# Patient Record
Sex: Male | Born: 1940 | Race: White | Hispanic: No | Marital: Married | Smoking: Former smoker
Health system: Southern US, Community
[De-identification: ages and names within clinical notes are randomized; demographics above are authoritative.]

## PROBLEM LIST (undated history)

## (undated) DIAGNOSIS — N281 Cyst of kidney, acquired: Secondary | ICD-10-CM

## (undated) DIAGNOSIS — I10 Essential (primary) hypertension: Secondary | ICD-10-CM

## (undated) DIAGNOSIS — Z87442 Personal history of urinary calculi: Secondary | ICD-10-CM

## (undated) DIAGNOSIS — N183 Chronic kidney disease, stage 3 unspecified: Secondary | ICD-10-CM

## (undated) DIAGNOSIS — E782 Mixed hyperlipidemia: Secondary | ICD-10-CM

## (undated) DIAGNOSIS — D72819 Decreased white blood cell count, unspecified: Secondary | ICD-10-CM

## (undated) DIAGNOSIS — R519 Headache, unspecified: Secondary | ICD-10-CM

## (undated) DIAGNOSIS — E041 Nontoxic single thyroid nodule: Secondary | ICD-10-CM

## (undated) DIAGNOSIS — N400 Enlarged prostate without lower urinary tract symptoms: Secondary | ICD-10-CM

## (undated) DIAGNOSIS — E118 Type 2 diabetes mellitus with unspecified complications: Secondary | ICD-10-CM

## (undated) DIAGNOSIS — M1990 Unspecified osteoarthritis, unspecified site: Secondary | ICD-10-CM

## (undated) DIAGNOSIS — M47816 Spondylosis without myelopathy or radiculopathy, lumbar region: Secondary | ICD-10-CM

## (undated) DIAGNOSIS — I639 Cerebral infarction, unspecified: Secondary | ICD-10-CM

## (undated) DIAGNOSIS — I77810 Thoracic aortic ectasia: Secondary | ICD-10-CM

## (undated) DIAGNOSIS — I5189 Other ill-defined heart diseases: Secondary | ICD-10-CM

## (undated) DIAGNOSIS — D0000 Carcinoma in situ of oral cavity, unspecified site: Secondary | ICD-10-CM

## (undated) DIAGNOSIS — K7460 Unspecified cirrhosis of liver: Secondary | ICD-10-CM

## (undated) DIAGNOSIS — N200 Calculus of kidney: Secondary | ICD-10-CM

## (undated) DIAGNOSIS — M109 Gout, unspecified: Secondary | ICD-10-CM

## (undated) HISTORY — PX: TOTAL SHOULDER REPLACEMENT: SUR1217

## (undated) HISTORY — DX: Chronic kidney disease, stage 3 unspecified: N18.30

## (undated) HISTORY — DX: Type 2 diabetes mellitus with unspecified complications: E11.8

## (undated) HISTORY — PX: ORTHOPEDIC SURGERY: SHX850

## (undated) HISTORY — DX: Carcinoma in situ of oral cavity, unspecified site: D00.00

## (undated) HISTORY — DX: Unspecified osteoarthritis, unspecified site: M19.90

## (undated) HISTORY — DX: Decreased white blood cell count, unspecified: D72.819

## (undated) HISTORY — DX: Nontoxic single thyroid nodule: E04.1

## (undated) HISTORY — DX: Cyst of kidney, acquired: N28.1

## (undated) HISTORY — PX: BIOPSY THYROID: PRO38

## (undated) HISTORY — DX: Calculus of kidney: N20.0

## (undated) HISTORY — DX: Other ill-defined heart diseases: I51.89

## (undated) HISTORY — DX: Unspecified cirrhosis of liver: K74.60

## (undated) HISTORY — DX: Cerebral infarction, unspecified: I63.9

## (undated) HISTORY — DX: Mixed hyperlipidemia: E78.2

## (undated) HISTORY — DX: Gout, unspecified: M10.9

## (undated) HISTORY — DX: Chronic kidney disease, stage 3 (moderate): N18.3

## (undated) HISTORY — DX: Thoracic aortic ectasia: I77.810

## (undated) HISTORY — DX: Headache, unspecified: R51.9

## (undated) HISTORY — DX: Spondylosis without myelopathy or radiculopathy, lumbar region: M47.816

## (undated) HISTORY — DX: Benign prostatic hyperplasia without lower urinary tract symptoms: N40.0

---

## 1994-11-18 HISTORY — PX: BACK SURGERY: SHX140

## 2000-01-23 ENCOUNTER — Encounter: Payer: Self-pay | Admitting: Family Medicine

## 2000-01-23 ENCOUNTER — Encounter: Admission: RE | Admit: 2000-01-23 | Discharge: 2000-01-23 | Payer: Self-pay | Admitting: Family Medicine

## 2007-11-19 HISTORY — PX: COLONOSCOPY: SHX174

## 2013-07-01 ENCOUNTER — Emergency Department (HOSPITAL_COMMUNITY)
Admission: EM | Admit: 2013-07-01 | Discharge: 2013-07-02 | Disposition: A | Discharge status: Home / Self Care | Payer: Medicare Other | Attending: Emergency Medicine | Admitting: Emergency Medicine

## 2013-07-01 ENCOUNTER — Encounter (HOSPITAL_COMMUNITY): Payer: Self-pay | Admitting: *Deleted

## 2013-07-01 DIAGNOSIS — R358 Other polyuria: Secondary | ICD-10-CM | POA: Insufficient documentation

## 2013-07-01 DIAGNOSIS — Z79899 Other long term (current) drug therapy: Secondary | ICD-10-CM | POA: Insufficient documentation

## 2013-07-01 DIAGNOSIS — Z880 Allergy status to penicillin: Secondary | ICD-10-CM | POA: Insufficient documentation

## 2013-07-01 DIAGNOSIS — R3589 Other polyuria: Secondary | ICD-10-CM | POA: Insufficient documentation

## 2013-07-01 DIAGNOSIS — I10 Essential (primary) hypertension: Secondary | ICD-10-CM | POA: Insufficient documentation

## 2013-07-01 DIAGNOSIS — Z7982 Long term (current) use of aspirin: Secondary | ICD-10-CM | POA: Insufficient documentation

## 2013-07-01 DIAGNOSIS — R5381 Other malaise: Secondary | ICD-10-CM | POA: Insufficient documentation

## 2013-07-01 DIAGNOSIS — R5383 Other fatigue: Secondary | ICD-10-CM | POA: Insufficient documentation

## 2013-07-01 DIAGNOSIS — R609 Edema, unspecified: Secondary | ICD-10-CM | POA: Insufficient documentation

## 2013-07-01 DIAGNOSIS — R631 Polydipsia: Secondary | ICD-10-CM | POA: Insufficient documentation

## 2013-07-01 DIAGNOSIS — N289 Disorder of kidney and ureter, unspecified: Secondary | ICD-10-CM | POA: Insufficient documentation

## 2013-07-01 DIAGNOSIS — E1169 Type 2 diabetes mellitus with other specified complication: Secondary | ICD-10-CM | POA: Insufficient documentation

## 2013-07-01 DIAGNOSIS — R739 Hyperglycemia, unspecified: Secondary | ICD-10-CM

## 2013-07-01 HISTORY — DX: Essential (primary) hypertension: I10

## 2013-07-01 LAB — COMPREHENSIVE METABOLIC PANEL
ALT: 24 U/L (ref 0–53)
ALT: 18 U/L (ref 0–53)
AST: 28 U/L (ref 0–37)
AST: 21 U/L (ref 0–37)
Albumin: 3.7 g/dL (ref 3.5–5.2)
Albumin: 3.2 g/dL — ABNORMAL LOW (ref 3.5–5.2)
Alkaline Phosphatase: 40 U/L (ref 39–117)
Alkaline Phosphatase: 33 U/L — ABNORMAL LOW (ref 39–117)
BUN: 39 mg/dL — ABNORMAL HIGH (ref 6–23)
CO2: 26 mEq/L (ref 19–32)
Calcium: 9.8 mg/dL (ref 8.4–10.5)
Calcium: 8.9 mg/dL (ref 8.4–10.5)
Chloride: 93 mEq/L — ABNORMAL LOW (ref 96–112)
Creatinine, Ser: 1.8 mg/dL — ABNORMAL HIGH (ref 0.50–1.35)
GFR calc Af Amer: 41 mL/min — ABNORMAL LOW (ref >90)
GFR calc Af Amer: 47 mL/min — ABNORMAL LOW (ref >90)
GFR calc non Af Amer: 36 mL/min — ABNORMAL LOW (ref >90)
Glucose, Bld: 502 mg/dL — ABNORMAL HIGH (ref 70–99)
Potassium: 4.9 mEq/L (ref 3.5–5.1)
Potassium: 3.8 mEq/L (ref 3.5–5.1)
Sodium: 131 mEq/L — ABNORMAL LOW (ref 135–145)
Sodium: 137 mEq/L (ref 135–145)
Total Bilirubin: 0.4 mg/dL (ref 0.3–1.2)
Total Protein: 6.3 g/dL (ref 6.0–8.3)
Total Protein: 5.7 g/dL — ABNORMAL LOW (ref 6.0–8.3)

## 2013-07-01 LAB — CBC
HCT: 37.4 % — ABNORMAL LOW (ref 39.0–52.0)
Hemoglobin: 13.3 g/dL (ref 13.0–17.0)
MCH: 33.9 pg (ref 26.0–34.0)
MCHC: 35.6 g/dL (ref 30.0–36.0)
MCV: 95.4 fL (ref 78.0–100.0)
Platelets: 238 10*3/uL (ref 150–400)
RBC: 3.92 MIL/uL — ABNORMAL LOW (ref 4.22–5.81)
RDW: 13.9 % (ref 11.5–15.5)
WBC: 6.2 10*3/uL (ref 4.0–10.5)

## 2013-07-01 LAB — GLUCOSE, CAPILLARY
Glucose-Capillary: 442 mg/dL — ABNORMAL HIGH (ref 70–99)
Glucose-Capillary: 357 mg/dL — ABNORMAL HIGH (ref 70–99)

## 2013-07-01 MED ORDER — INSULIN ASPART 100 UNIT/ML ~~LOC~~ SOLN
10.0000 [IU] | Freq: Once | SUBCUTANEOUS | Status: AC
  Administered 2013-07-01: 10 [IU] via INTRAVENOUS
  Filled 2013-07-01: qty 1

## 2013-07-01 MED ORDER — SODIUM CHLORIDE 0.9 % IV BOLUS (SEPSIS)
1000.0000 mL | Freq: Once | INTRAVENOUS | Status: AC
Start: 2013-07-01 — End: 2013-07-01
  Administered 2013-07-01: 1000 mL via INTRAVENOUS

## 2013-07-01 MED ORDER — SODIUM CHLORIDE 0.9 % IV BOLUS (SEPSIS)
1000.0000 mL | Freq: Once | INTRAVENOUS | Status: AC
  Administered 2013-07-01: 1000 mL via INTRAVENOUS

## 2013-07-01 NOTE — ED Notes (Signed)
Sent to ED by PMD for treatment of high glucose. States office meter read 'high'. States he has been drinking a lot of water and urinating frequently. Denies cp and/or sob. Denies vomiting.

## 2013-07-02 NOTE — ED Provider Notes (Signed)
CSN: [id number]     Arrival date & time 07/01/13  1532 History     First MD Initiated Contact with Patient 07/01/13 1737     Chief Complaint  Patient presents with  . Hyperglycemia   (Consider location/radiation/quality/duration/timing/severity/associated sxs/prior Treatment) HPI Comments: Pt send from PCP office for hyperglcemia.  Pt reports generalized weakness, polyuria/polydypsia, FSBG>500 today.  Has not taken it in several days but has been complaint w/ meds.  No recent illness.  Pt was given a dose of short acting & long acting insulin in office prior to arrival.  Pt denies CP, SOB, ab pain, n/v, d/a, fever.  No hx of DKA.    Past Medical History  Diagnosis Date  . Diabetes mellitus without complication   . Hypertension    Past Surgical History  Procedure Laterality Date  . Orthopedic surgery     History reviewed. No pertinent family history. History  Substance Use Topics  . Smoking status: Never Smoker   . Smokeless tobacco: Not on file  . Alcohol Use: No    Review of Systems  Constitutional: Negative for fever, activity change, appetite change and fatigue.  HENT: Negative for congestion, facial swelling, rhinorrhea and trouble swallowing.   Eyes: Negative for photophobia and pain.  Respiratory: Negative for cough, chest tightness and shortness of breath.   Cardiovascular: Negative for chest pain and leg swelling.  Gastrointestinal: Negative for nausea, vomiting, abdominal pain, diarrhea and constipation.  Endocrine: Positive for polydipsia and polyuria.  Genitourinary: Negative for dysuria, urgency, decreased urine volume and difficulty urinating.  Musculoskeletal: Negative for back pain and gait problem.  Skin: Negative for color change, rash and wound.  Allergic/Immunologic: Negative for immunocompromised state.  Neurological: Positive for weakness (generalized). Negative for dizziness, facial asymmetry, speech difficulty, numbness and headaches.   Psychiatric/Behavioral: Negative for confusion, decreased concentration and agitation.    Allergies  Ivp dye and Penicillins  Home Medications   Current Outpatient Rx  Name  Route  Sig  Dispense  Refill  . allopurinol (ZYLOPRIM) 300 MG tablet   Oral   Take 300 mg by mouth at bedtime.         Marland Kitchen aspirin 81 MG tablet   Oral   Take 81 mg by mouth daily.         Marland Kitchen atorvastatin (LIPITOR) 20 MG tablet   Oral   Take 20 mg by mouth daily.         . benazepril (LOTENSIN) 40 MG tablet   Oral   Take 40 mg by mouth at bedtime.         . fenofibrate micronized (LOFIBRA) 134 MG capsule   Oral   Take 134 mg by mouth daily before breakfast.         . Ibuprofen (ADVIL PO)   Oral   Take 3 tablets by mouth daily as needed (pain).         . Loratadine (CLARITIN PO)   Oral   Take 1 tablet by mouth daily as needed (allergies).         . metFORMIN (GLUCOPHAGE) 1000 MG tablet   Oral   Take 1,000 mg by mouth 2 (two) times daily with a meal.         . pioglitazone (ACTOS) 45 MG tablet   Oral   Take 45 mg by mouth daily.          BP 167/70  Pulse 72  Temp(Src) 98.3 F (36.8 C) (Oral)  Resp 15  SpO2  96% Physical Exam  Constitutional: He is oriented to person, place, and time. He appears well-developed and well-nourished. No distress.  HENT:  Head: Normocephalic and atraumatic.  Mouth/Throat: No oropharyngeal exudate.  Eyes: Pupils are equal, round, and reactive to light.  Neck: Normal range of motion. Neck supple.  Cardiovascular: Normal rate, regular rhythm and normal heart sounds.  Exam reveals no gallop and no friction rub.   No murmur heard. Pulmonary/Chest: Effort normal and breath sounds normal. No respiratory distress. He has no wheezes. He has no rales.  Abdominal: Soft. Bowel sounds are normal. He exhibits no distension and no mass. There is no tenderness. There is no rebound and no guarding.  Musculoskeletal: Normal range of motion. He exhibits edema  (BLLE). He exhibits no tenderness.  Neurological: He is alert and oriented to person, place, and time. No cranial nerve deficit. Coordination normal.  Skin: Skin is warm and dry.  Psychiatric: He has a normal mood and affect.    ED Course   Procedures (including critical care time)  Labs Reviewed  COMPREHENSIVE METABOLIC PANEL - Abnormal; Notable for the following:    Sodium 131 (*)    Chloride 93 (*)    Glucose, Bld 502 (*)    BUN 39 (*)    Creatinine, Ser 1.80 (*)    GFR calc non Af Amer 36 (*)    GFR calc Af Amer 41 (*)    All other components within normal limits  CBC - Abnormal; Notable for the following:    RBC 3.92 (*)    HCT 37.4 (*)    All other components within normal limits  GLUCOSE, CAPILLARY - Abnormal; Notable for the following:    Glucose-Capillary 442 (*)    All other components within normal limits  GLUCOSE, CAPILLARY - Abnormal; Notable for the following:    Glucose-Capillary 357 (*)    All other components within normal limits  COMPREHENSIVE METABOLIC PANEL - Abnormal; Notable for the following:    Glucose, Bld 156 (*)    BUN 35 (*)    Creatinine, Ser 1.63 (*)    Total Protein 5.7 (*)    Albumin 3.2 (*)    Alkaline Phosphatase 33 (*)    GFR calc non Af Amer 40 (*)    GFR calc Af Amer 47 (*)    All other components within normal limits  GLUCOSE, CAPILLARY - Abnormal; Notable for the following:    Glucose-Capillary 195 (*)    All other components within normal limits  BLOOD GAS, VENOUS   No results found. 1. Hyperglycemia   2. Renal insufficiency     MDM  Pt send from PCP office for hyperglcemia.  Pt reports generalized weakness, polyuria/polydypsia, FSBG>500 today.  Has not taken it in several days but has been complaint w/ meds.  No recent illness.  Pt found to have hyperglycemia w/o concern for DKA.  Glucose improved after 2L NS and 10U regular insulin.  Pt's symptoms also improved.  Additionaly, he had Cr of 1.8 that improved to 1.6  No prior  in system.  Will f/u as outpt for repeat Cr. Will d/c home w/ return precautions for new or worsening symptoms. He will hold home actos tonight as he has had dose of long acting insulin in PCP office today.  Will resume meds in morning.  Return precautions given for new or worsening symptoms including hyer/hypoglycemia, fever, CP, SOB.   1. Hyperglycemia   2. Renal insufficiency      Emmitt Matthews  Arna Snipe, MD 07/02/13 [phone]

## 2016-02-15 DIAGNOSIS — I10 Essential (primary) hypertension: Secondary | ICD-10-CM | POA: Insufficient documentation

## 2016-02-15 DIAGNOSIS — E119 Type 2 diabetes mellitus without complications: Secondary | ICD-10-CM | POA: Insufficient documentation

## 2016-02-15 DIAGNOSIS — E782 Mixed hyperlipidemia: Secondary | ICD-10-CM | POA: Insufficient documentation

## 2016-02-15 DIAGNOSIS — M1990 Unspecified osteoarthritis, unspecified site: Secondary | ICD-10-CM | POA: Insufficient documentation

## 2016-02-15 DIAGNOSIS — M109 Gout, unspecified: Secondary | ICD-10-CM | POA: Insufficient documentation

## 2016-05-29 ENCOUNTER — Other Ambulatory Visit: Payer: Self-pay | Admitting: Nephrology

## 2016-05-29 DIAGNOSIS — N183 Chronic kidney disease, stage 3 unspecified: Secondary | ICD-10-CM

## 2016-06-03 ENCOUNTER — Ambulatory Visit
Admission: RE | Admit: 2016-06-03 | Discharge: 2016-06-03 | Disposition: A | Discharge status: Home / Self Care | Payer: Medicare Other | Source: Ambulatory Visit | Attending: Nephrology | Admitting: Nephrology

## 2016-06-03 DIAGNOSIS — N183 Chronic kidney disease, stage 3 unspecified: Secondary | ICD-10-CM

## 2016-06-03 DIAGNOSIS — N281 Cyst of kidney, acquired: Secondary | ICD-10-CM

## 2016-06-03 HISTORY — DX: Cyst of kidney, acquired: N28.1

## 2016-06-03 IMAGING — US US RENAL
1 series · 14 of 25 positions shown · non-contrast
Comparison: None in PACs

CLINICAL DATA: Chronic renal insufficiency

EXAM:
RENAL / URINARY TRACT ULTRASOUND COMPLETE

[Series 1: us renal · 0.28mm/px · 14 of 36 slices shown]
[im 1/36]
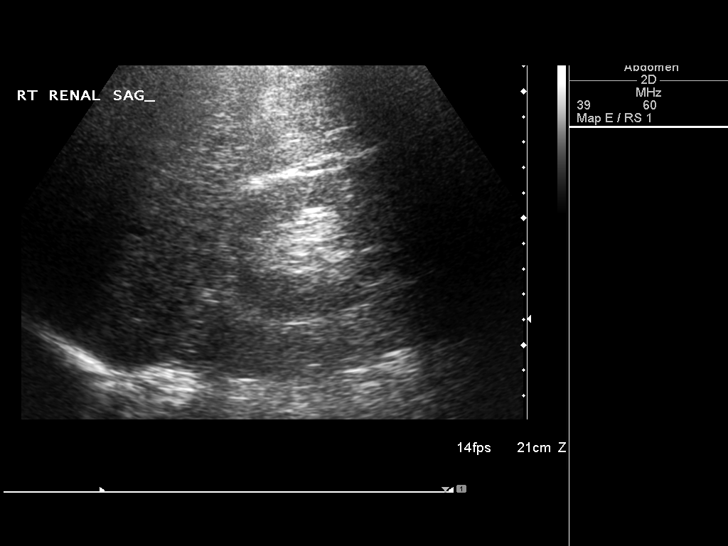
[im 3/36]
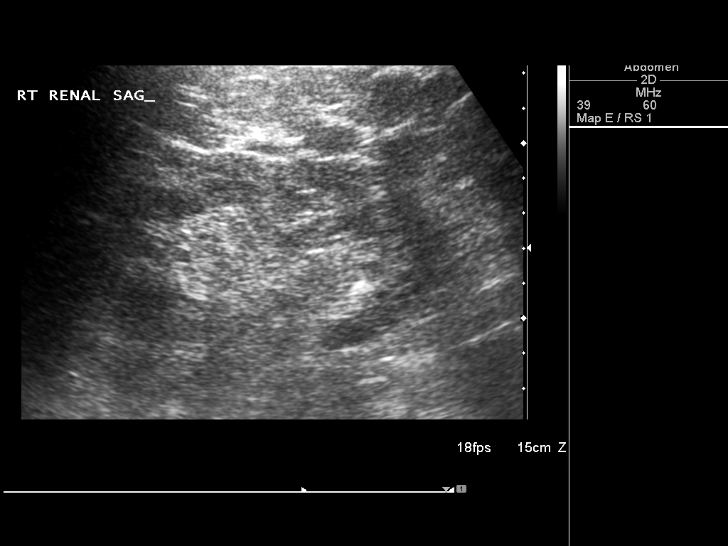
[im 6/36]
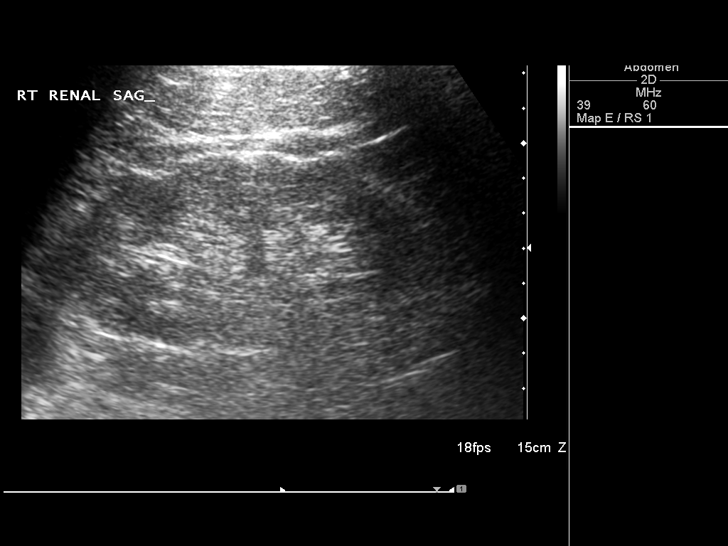
[im 9/36]
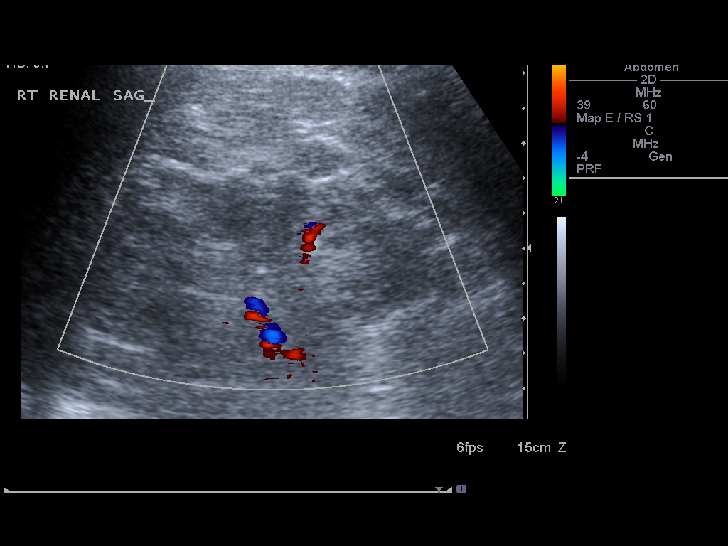
[im 12/36]
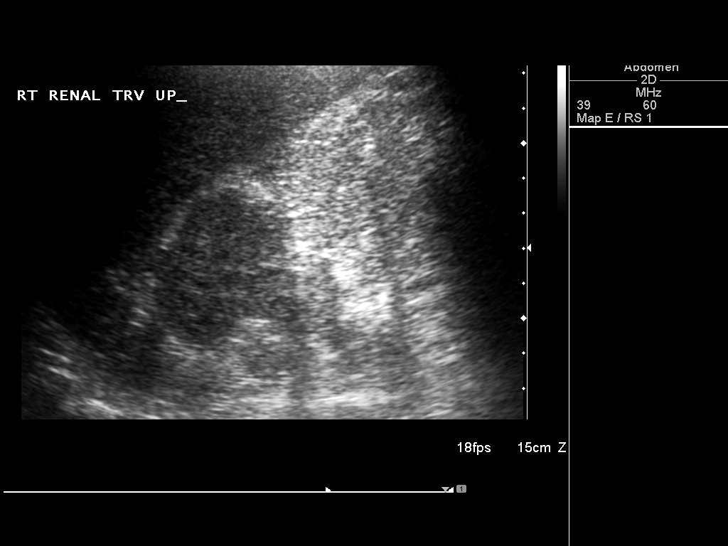
[im 14/36]
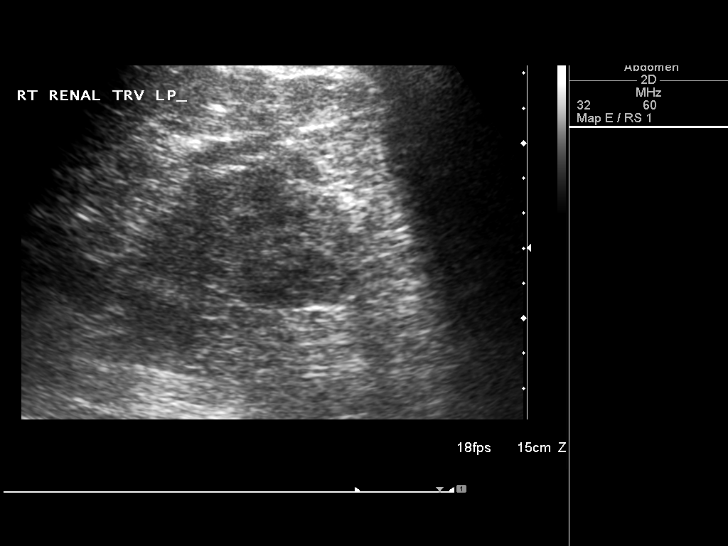
[im 17/36]
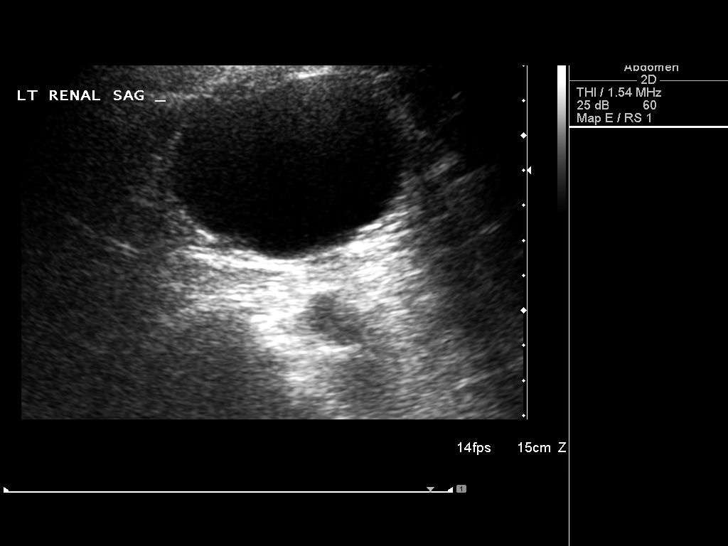
[im 19/36]
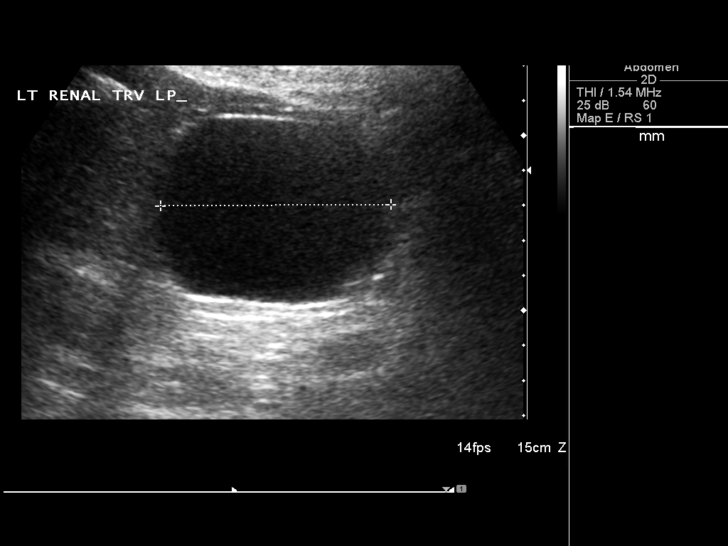
[im 22/36]
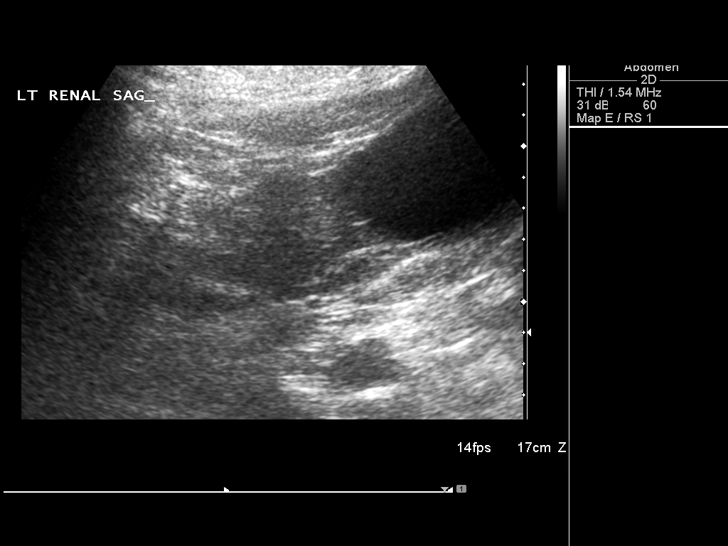
[im 24/36]
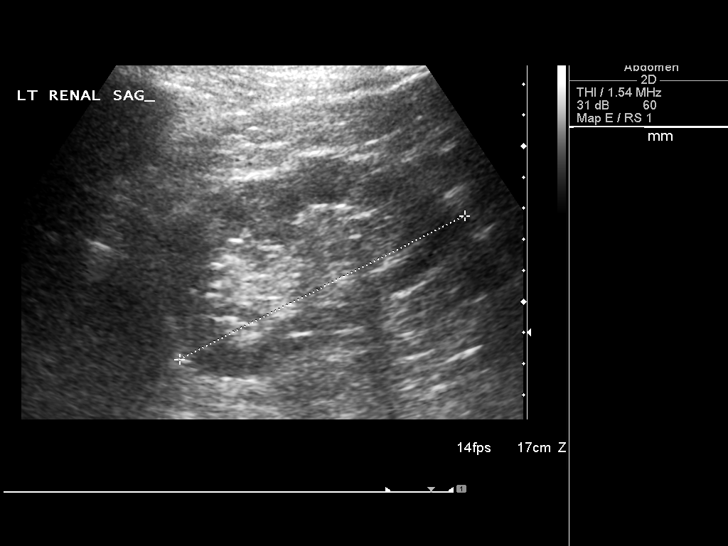
[im 27/36]
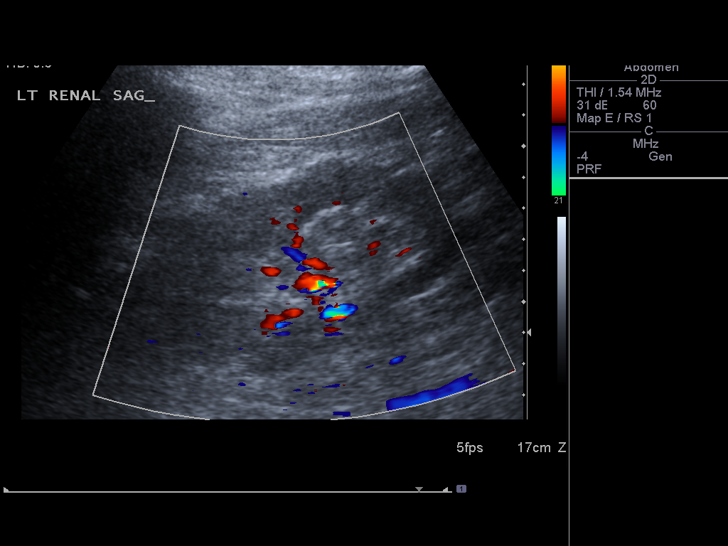
[im 30/36]
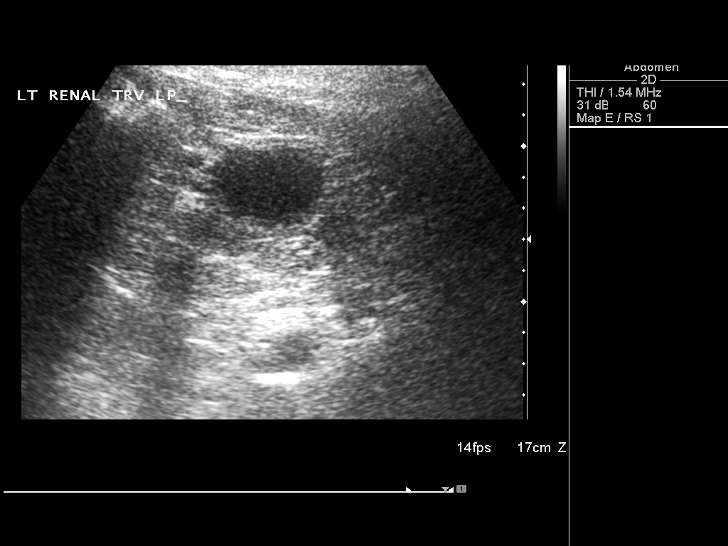
[im 33/36]
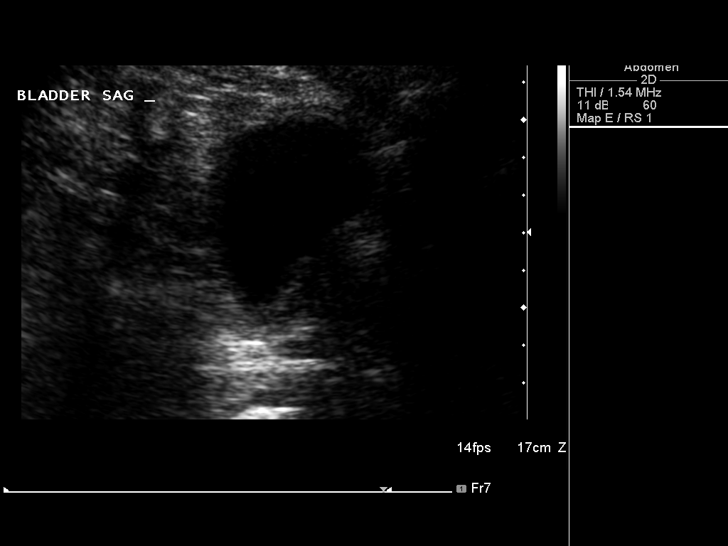
[im 36/36]
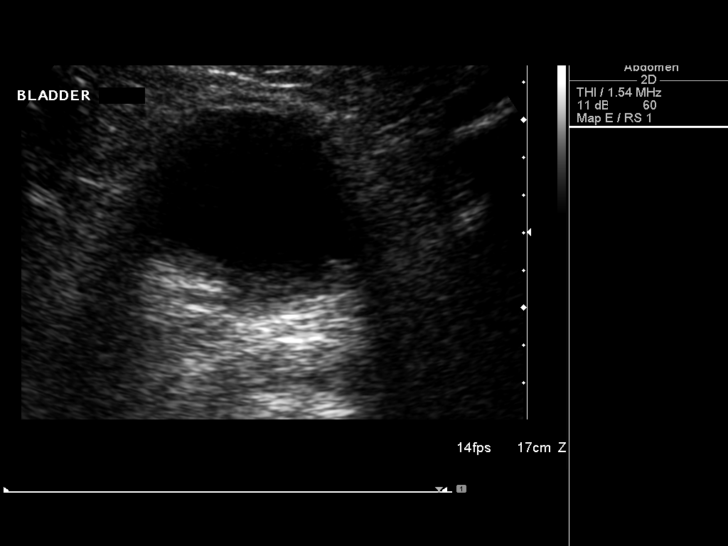

[14 of 25 positions shown; findings below may reference images not displayed]

FINDINGS: Right Kidney:

Length: 11.3 cm. The renal cortical echotexture of the right kidney
is approximately equal to that of the liver. There is no discrete
mass. There is no hydronephrosis.

Left Kidney:

Length: 10.3 cm. The echotexture of the cortex of the left kidney is
similar to that on the right. There is a dominant lower pole cyst
measuring 6.8 x 5.3 x 6.6 cm. There is no hydronephrosis.

Bladder:

The partially distended urinary bladder is normal.
IMPRESSION: Increased renal cortical echotexture bilaterally consistent with
medical renal disease. There is no evidence of obstruction. There is
a simple appearing cystic structure in the lower pole of the left
kidney measuring 6.8 cm in greatest dimension.

## 2016-08-26 ENCOUNTER — Encounter: Payer: Self-pay | Admitting: Family Medicine

## 2016-08-26 ENCOUNTER — Ambulatory Visit (INDEPENDENT_AMBULATORY_CARE_PROVIDER_SITE_OTHER): Payer: Medicare Other | Admitting: Family Medicine

## 2016-08-26 VITALS — BP 123/78 | HR 86 | Temp 97.5°F | Resp 14 | Ht 72.0 in | Wt 226.4 lb

## 2016-08-26 DIAGNOSIS — Z794 Long term (current) use of insulin: Secondary | ICD-10-CM | POA: Diagnosis not present

## 2016-08-26 DIAGNOSIS — Z23 Encounter for immunization: Secondary | ICD-10-CM | POA: Diagnosis not present

## 2016-08-26 DIAGNOSIS — N183 Chronic kidney disease, stage 3 unspecified: Secondary | ICD-10-CM

## 2016-08-26 DIAGNOSIS — E118 Type 2 diabetes mellitus with unspecified complications: Secondary | ICD-10-CM

## 2016-08-26 LAB — BASIC METABOLIC PANEL
BUN: 36 mg/dL — AB (ref 6–23)
CHLORIDE: 105 meq/L (ref 96–112)
CO2: 26 meq/L (ref 19–32)
Calcium: 9.7 mg/dL (ref 8.4–10.5)
Creatinine, Ser: 1.99 mg/dL — ABNORMAL HIGH (ref 0.40–1.50)
GFR: 34.8 mL/min — ABNORMAL LOW (ref >60.00)
Glucose, Bld: 265 mg/dL — ABNORMAL HIGH (ref 70–99)
POTASSIUM: 4.8 meq/L (ref 3.5–5.1)
Sodium: 138 mEq/L (ref 135–145)

## 2016-08-26 LAB — HEMOGLOBIN A1C: HEMOGLOBIN A1C: 8.2 % — AB (ref 4.6–6.5)

## 2016-08-26 NOTE — Addendum Note (Signed)
Addended by: Gordy Councilman on: 08/26/2016 09:43 AM   Modules accepted: Orders
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
## 2016-08-26 NOTE — Progress Notes (Signed)
Pre visit review using our clinic review tool, if applicable. No additional management support is needed unless otherwise documented below in the visit note. 

## 2016-08-28 ENCOUNTER — Encounter: Payer: Self-pay | Admitting: Family Medicine

## 2016-10-28 ENCOUNTER — Other Ambulatory Visit: Payer: Self-pay | Admitting: *Deleted

## 2016-10-28 ENCOUNTER — Ambulatory Visit (INDEPENDENT_AMBULATORY_CARE_PROVIDER_SITE_OTHER): Payer: Medicare Other | Admitting: Family Medicine

## 2016-10-28 ENCOUNTER — Encounter: Payer: Self-pay | Admitting: Family Medicine

## 2016-10-28 VITALS — BP 152/70 | HR 92 | Temp 97.8°F | Ht 72.0 in | Wt 225.0 lb

## 2016-10-28 DIAGNOSIS — E119 Type 2 diabetes mellitus without complications: Secondary | ICD-10-CM | POA: Diagnosis not present

## 2016-10-28 DIAGNOSIS — Z0000 Encounter for general adult medical examination without abnormal findings: Secondary | ICD-10-CM | POA: Diagnosis not present

## 2016-10-28 MED ORDER — ATORVASTATIN CALCIUM 20 MG PO TABS: 20.0000 mg | ORAL_TABLET | Freq: Every day | ORAL | 1 refills | Status: DC

## 2016-10-28 MED ORDER — FENOFIBRATE MICRONIZED 134 MG PO CAPS: 134.0000 mg | ORAL_CAPSULE | Freq: Every day | ORAL | 1 refills | Status: DC

## 2016-10-28 MED ORDER — ALLOPURINOL 300 MG PO TABS: 300.0000 mg | ORAL_TABLET | Freq: Every day | ORAL | 3 refills | Status: DC

## 2016-10-28 MED ORDER — LANTUS 100 UNIT/ML ~~LOC~~ SOLN: SUBCUTANEOUS | 1 refills | Status: DC

## 2016-10-28 NOTE — Telephone Encounter (Signed)
Fax from [facility] requesting refill for Lantus vial, fenofibrate, atorvastatin and allopurinol.  LOV: 10/28/16 NOV: 12/27/16   RF's unknow, new pt.
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
## 2016-10-28 NOTE — Progress Notes (Signed)
Pre visit review using our clinic review tool, if applicable. No additional management support is needed unless otherwise documented below in the visit note. 

## 2016-10-28 NOTE — Patient Instructions (Addendum)
Eat heart healthy diet (full of fruits, vegetables, whole grains, lean protein, water--limit salt, fat, and sugar intake) and increase physical activity as tolerated.  Start doing brain stimulating activities (puzzles, reading, adult coloring books, staying active) to keep memory sharp.   Bring a copy of your advance directives to your next office visit.   Fall Prevention in the Home Introduction Falls can cause injuries. They can happen to people of all ages. There are many things you can do to make your home safe and to help prevent falls. What can I do on the outside of my home?  Regularly fix the edges of walkways and driveways and fix any cracks.  Remove anything that might make you trip as you walk through a door, such as a raised step or threshold.  Trim any bushes or trees on the path to your home.  Use bright outdoor lighting.  Clear any walking paths of anything that might make someone trip, such as rocks or tools.  Regularly check to see if handrails are loose or broken. Make sure that both sides of any steps have handrails.  Any raised decks and porches should have guardrails on the edges.  Have any leaves, snow, or ice cleared regularly.  Use sand or salt on walking paths during winter.  Clean up any spills in your garage right away. This includes oil or grease spills. What can I do in the bathroom?  Use night lights.  Install grab bars by the toilet and in the tub and shower. Do not use towel bars as grab bars.  Use non-skid mats or decals in the tub or shower.  If you need to sit down in the shower, use a plastic, non-slip stool.  Keep the floor dry. Clean up any water that spills on the floor as soon as it happens.  Remove soap buildup in the tub or shower regularly.  Attach bath mats securely with double-sided non-slip rug tape.  Do not have throw rugs and other things on the floor that can make you trip. What can I do in the bedroom?  Use night  lights.  Make sure that you have a light by your bed that is easy to reach.  Do not use any sheets or blankets that are too big for your bed. They should not hang down onto the floor.  Have a firm chair that has side arms. You can use this for support while you get dressed.  Do not have throw rugs and other things on the floor that can make you trip. What can I do in the kitchen?  Clean up any spills right away.  Avoid walking on wet floors.  Keep items that you use a lot in easy-to-reach places.  If you need to reach something above you, use a strong step stool that has a grab bar.  Keep electrical cords out of the way.  Do not use floor polish or wax that makes floors slippery. If you must use wax, use non-skid floor wax.  Do not have throw rugs and other things on the floor that can make you trip. What can I do with my stairs?  Do not leave any items on the stairs.  Make sure that there are handrails on both sides of the stairs and use them. Fix handrails that are broken or loose. Make sure that handrails are as long as the stairways.  Check any carpeting to make sure that it is firmly attached to the stairs. Fix  any carpet that is loose or worn.  Avoid having throw rugs at the top or bottom of the stairs. If you do have throw rugs, attach them to the floor with carpet tape.  Make sure that you have a light switch at the top of the stairs and the bottom of the stairs. If you do not have them, ask someone to add them for you. What else can I do to help prevent falls?  Wear shoes that:  Do not have high heels.  Have rubber bottoms.  Are comfortable and fit you well.  Are closed at the toe. Do not wear sandals.  If you use a stepladder:  Make sure that it is fully opened. Do not climb a closed stepladder.  Make sure that both sides of the stepladder are locked into place.  Ask someone to hold it for you, if possible.  Clearly mark and make sure that you can  see:  Any grab bars or handrails.  First and last steps.  Where the edge of each step is.  Use tools that help you move around (mobility aids) if they are needed. These include:  Canes.  Walkers.  Scooters.  Crutches.  Turn on the lights when you go into a dark area. Replace any light bulbs as soon as they burn out.  Set up your furniture so you have a clear path. Avoid moving your furniture around.  If any of your floors are uneven, fix them.  If there are any pets around you, be aware of where they are.  Review your medicines with your doctor. Some medicines can make you feel dizzy. This can increase your chance of falling. Ask your doctor what other things that you can do to help prevent falls. This information is not intended to replace advice given to you by your health care provider. Make sure you discuss any questions you have with your health care provider. Document Released: 08/31/2009 Document Revised: 04/11/2016 Document Reviewed: 12/09/2014  2017 Elsevier  Health Maintenance, Male A healthy lifestyle and preventative care can promote health and wellness.  Maintain regular health, dental, and eye exams.  Eat a healthy diet. Foods like vegetables, fruits, whole grains, low-fat dairy products, and lean protein foods contain the nutrients you need and are low in calories. Decrease your intake of foods high in solid fats, added sugars, and salt. Get information about a proper diet from your health care provider, if necessary.  Regular physical exercise is one of the most important things you can do for your health. Most adults should get at least 150 minutes of moderate-intensity exercise (any activity that increases your heart rate and causes you to sweat) each week. In addition, most adults need muscle-strengthening exercises on 2 or more days a week.   Maintain a healthy weight. The body mass index (BMI) is a screening tool to identify possible weight problems. It  provides an estimate of body fat based on height and weight. Your health care provider can find your BMI and can help you achieve or maintain a healthy weight. For males 20 years and older:  A BMI below 18.5 is considered underweight.  A BMI of 18.5 to 24.9 is normal.  A BMI of 25 to 29.9 is considered overweight.  A BMI of 30 and above is considered obese.  Maintain normal blood lipids and cholesterol by exercising and minimizing your intake of saturated fat. Eat a balanced diet with plenty of fruits and vegetables. Blood tests for lipids  and cholesterol should begin at age 20 and be repeated every 5 years. If your lipid or cholesterol levels are high, you are over age 50, or you are at high risk for heart disease, you may need your cholesterol levels checked more frequently.Ongoing high lipid and cholesterol levels should be treated with medicines if diet and exercise are not working.  If you smoke, find out from your health care provider how to quit. If you do not use tobacco, do not start.  Lung cancer screening is recommended for adults aged 55-80 years who are at high risk for developing lung cancer because of a history of smoking. A yearly low-dose CT scan of the lungs is recommended for people who have at least a 30-pack-year history of smoking and are current smokers or have quit within the past 15 years. A pack year of smoking is smoking an average of 1 pack of cigarettes a day for 1 year (for example, a 30-pack-year history of smoking could mean smoking 1 pack a day for 30 years or 2 packs a day for 15 years). Yearly screening should continue until the smoker has stopped smoking for at least 15 years. Yearly screening should be stopped for people who develop a health problem that would prevent them from having lung cancer treatment.  If you choose to drink alcohol, do not have more than 2 drinks per day. One drink is considered to be 12 oz (360 mL) of beer, 5 oz (150 mL) of wine, or 1.5  oz (45 mL) of liquor.  Avoid the use of street drugs. Do not share needles with anyone. Ask for help if you need support or instructions about stopping the use of drugs.  High blood pressure causes heart disease and increases the risk of stroke. High blood pressure is more likely to develop in:  People who have blood pressure in the end of the normal range (100-139/85-89 mm Hg).  People who are overweight or obese.  People who are African American.  If you are 18-39 years of age, have your blood pressure checked every 3-5 years. If you are 40 years of age or older, have your blood pressure checked every year. You should have your blood pressure measured twice-once when you are at a hospital or clinic, and once when you are not at a hospital or clinic. Record the average of the two measurements. To check your blood pressure when you are not at a hospital or clinic, you can use:  An automated blood pressure machine at a pharmacy.  A home blood pressure monitor.  If you are 45-79 years old, ask your health care provider if you should take aspirin to prevent heart disease.  Diabetes screening involves taking a blood sample to check your fasting blood sugar level. This should be done once every 3 years after age 45 if you are at a normal weight and without risk factors for diabetes. Testing should be considered at a younger age or be carried out more frequently if you are overweight and have at least 1 risk factor for diabetes.  Colorectal cancer can be detected and often prevented. Most routine colorectal cancer screening begins at the age of 50 and continues through age 75. However, your health care provider may recommend screening at an earlier age if you have risk factors for colon cancer. On a yearly basis, your health care provider may provide home test kits to check for hidden blood in the stool. A small camera at the   end of a tube may be used to directly examine the colon (sigmoidoscopy or  colonoscopy) to detect the earliest forms of colorectal cancer. Talk to your health care provider about this at age 76 when routine screening begins. A direct exam of the colon should be repeated every 5-10 years through age 27, unless early forms of precancerous polyps or small growths are found.  People who are at an increased risk for hepatitis B should be screened for this virus. You are considered at high risk for hepatitis B if:  You were born in a country where hepatitis B occurs often. Talk with your health care provider about which countries are considered high risk.  Your parents were born in a high-risk country and you have not received a shot to protect against hepatitis B (hepatitis B vaccine).  You have HIV or AIDS.  You use needles to inject street drugs.  You live with, or have sex with, someone who has hepatitis B.  You are a man who has sex with other men (MSM).  You get hemodialysis treatment.  You take certain medicines for conditions like cancer, organ transplantation, and autoimmune conditions.  Hepatitis C blood testing is recommended for all people born from [birth year] through 1967 and any individual with known risk factors for hepatitis C.  Healthy men should no longer receive prostate-specific antigen (PSA) blood tests as part of routine cancer screening. Talk to your health care provider about prostate cancer screening.  Testicular cancer screening is not recommended for adolescents or adult males who have no symptoms. Screening includes self-exam, a health care provider exam, and other screening tests. Consult with your health care provider about any symptoms you have or any concerns you have about testicular cancer.  Practice safe sex. Use condoms and avoid high-risk sexual practices to reduce the spread of sexually transmitted infections (STIs).  You should be screened for STIs, including gonorrhea and chlamydia if:  You are sexually active and are younger than  24 years.  You are older than 24 years, and your health care provider tells you that you are at risk for this type of infection.  Your sexual activity has changed since you were last screened, and you are at an increased risk for chlamydia or gonorrhea. Ask your health care provider if you are at risk.  If you are at risk of being infected with HIV, it is recommended that you take a prescription medicine daily to prevent HIV infection. This is called pre-exposure prophylaxis (PrEP). You are considered at risk if:  You are a man who has sex with other men (MSM).  You are a heterosexual man who is sexually active with multiple partners.  You take drugs by injection.  You are sexually active with a partner who has HIV.  Talk with your health care provider about whether you are at high risk of being infected with HIV. If you choose to begin PrEP, you should first be tested for HIV. You should then be tested every 3 months for as long as you are taking PrEP.  Use sunscreen. Apply sunscreen liberally and repeatedly throughout the day. You should seek shade when your shadow is shorter than you. Protect yourself by wearing long sleeves, pants, a wide-brimmed hat, and sunglasses year round whenever you are outdoors.  Tell your health care provider of new moles or changes in moles, especially if there is a change in shape or color. Also, tell your health care provider if a mole is larger  than the size of a pencil eraser.  A one-time screening for abdominal aortic aneurysm (AAA) and surgical repair of large AAAs by ultrasound is recommended for men aged 61-75 years who are current or former smokers.  Stay current with your vaccines (immunizations). This information is not intended to replace advice given to you by your health care provider. Make sure you discuss any questions you have with your health care provider. Document Released: 05/02/2008 Document Revised: 11/25/2014 Document Reviewed:  08/08/2015 Elsevier Interactive Patient Education  2017 [place].  Increase your morning lantus to 30 Units.  After 10 days, if your glucose 2 hours after a meal is not 140-170, then start increasing your morning lantus by 1 unit every 2 days until it gets into this range.
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
## 2016-10-31 NOTE — Progress Notes (Signed)
AWV reviewed and agree.  Signed:  Crissie Sickles, MD           10/31/2016

## 2016-12-18 LAB — HM DIABETES EYE EXAM

## 2016-12-27 ENCOUNTER — Ambulatory Visit: Payer: Medicare Other | Admitting: Family Medicine

## 2016-12-27 ENCOUNTER — Encounter: Payer: Self-pay | Admitting: Family Medicine

## 2016-12-27 ENCOUNTER — Ambulatory Visit (INDEPENDENT_AMBULATORY_CARE_PROVIDER_SITE_OTHER): Payer: Medicare Other | Admitting: Family Medicine

## 2016-12-27 VITALS — BP 133/81 | HR 73 | Temp 97.5°F | Resp 16 | Ht 72.0 in | Wt 226.5 lb

## 2016-12-27 DIAGNOSIS — E7800 Pure hypercholesterolemia, unspecified: Secondary | ICD-10-CM

## 2016-12-27 DIAGNOSIS — Z794 Long term (current) use of insulin: Secondary | ICD-10-CM

## 2016-12-27 DIAGNOSIS — I10 Essential (primary) hypertension: Secondary | ICD-10-CM | POA: Diagnosis not present

## 2016-12-27 DIAGNOSIS — E118 Type 2 diabetes mellitus with unspecified complications: Secondary | ICD-10-CM | POA: Diagnosis not present

## 2016-12-27 LAB — COMPREHENSIVE METABOLIC PANEL
ALBUMIN: 4.3 g/dL (ref 3.5–5.2)
ALK PHOS: 42 U/L (ref 39–117)
ALT: 36 U/L (ref 0–53)
AST: 43 U/L — AB (ref 0–37)
BILIRUBIN TOTAL: 0.6 mg/dL (ref 0.2–1.2)
BUN: 41 mg/dL — ABNORMAL HIGH (ref 6–23)
CALCIUM: 9.5 mg/dL (ref 8.4–10.5)
CHLORIDE: 104 meq/L (ref 96–112)
CO2: 28 mEq/L (ref 19–32)
CREATININE: 1.93 mg/dL — AB (ref 0.40–1.50)
GFR: 36.02 mL/min — ABNORMAL LOW (ref >60.00)
Glucose, Bld: 126 mg/dL — ABNORMAL HIGH (ref 70–99)
Potassium: 4.8 mEq/L (ref 3.5–5.1)
Sodium: 136 mEq/L (ref 135–145)
Total Protein: 6.6 g/dL (ref 6.0–8.3)

## 2016-12-27 LAB — LIPID PANEL
CHOLESTEROL: 99 mg/dL (ref 0–200)
HDL: 51.1 mg/dL (ref >39.00)
LDL CALC: 32 mg/dL (ref 0–99)
NonHDL: 47.65
TRIGLYCERIDES: 78 mg/dL (ref 0.0–149.0)
Total CHOL/HDL Ratio: 2
VLDL: 15.6 mg/dL (ref 0.0–40.0)

## 2016-12-27 LAB — HEMOGLOBIN A1C: Hgb A1c MFr Bld: 7.5 % — ABNORMAL HIGH (ref 4.6–6.5)

## 2016-12-27 MED ORDER — LANTUS 100 UNIT/ML ~~LOC~~ SOLN: SUBCUTANEOUS | 1 refills | Status: DC

## 2016-12-27 NOTE — Progress Notes (Signed)
Pre visit review using our clinic review tool, if applicable. No additional management support is needed unless otherwise documented below in the visit note. 
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
## 2017-01-20 ENCOUNTER — Encounter: Payer: Self-pay | Admitting: Family Medicine

## 2017-01-29 ENCOUNTER — Other Ambulatory Visit: Payer: Self-pay | Admitting: *Deleted

## 2017-01-29 MED ORDER — FENOFIBRATE MICRONIZED 134 MG PO CAPS: 134.0000 mg | ORAL_CAPSULE | Freq: Every day | ORAL | 1 refills | Status: DC

## 2017-01-29 NOTE — Telephone Encounter (Signed)
[facility].  RF request for fenofibrate LOV: 12/27/16 Next ov: 03/26/17 Last written: 10/28/16 #90 w/ 1RF

## 2017-02-17 ENCOUNTER — Other Ambulatory Visit: Payer: Self-pay | Admitting: Family Medicine

## 2017-03-24 ENCOUNTER — Other Ambulatory Visit: Payer: Self-pay | Admitting: *Deleted

## 2017-03-24 MED ORDER — AMLODIPINE BESYLATE 5 MG PO TABS: 5.0000 mg | ORAL_TABLET | Freq: Every day | ORAL | 1 refills | Status: DC

## 2017-03-24 NOTE — Telephone Encounter (Signed)
[facility].  RF request for amlodipine LOV: 12/27/16 Next ov: 03/26/17 Last written: Unknown

## 2017-03-26 ENCOUNTER — Ambulatory Visit (INDEPENDENT_AMBULATORY_CARE_PROVIDER_SITE_OTHER): Payer: Medicare Other | Admitting: Family Medicine

## 2017-03-26 ENCOUNTER — Encounter: Payer: Self-pay | Admitting: Family Medicine

## 2017-03-26 VITALS — BP 126/78 | HR 79 | Temp 97.6°F | Resp 16 | Ht 72.0 in | Wt 224.5 lb

## 2017-03-26 DIAGNOSIS — E1122 Type 2 diabetes mellitus with diabetic chronic kidney disease: Secondary | ICD-10-CM

## 2017-03-26 DIAGNOSIS — N183 Chronic kidney disease, stage 3 unspecified: Secondary | ICD-10-CM

## 2017-03-26 DIAGNOSIS — I10 Essential (primary) hypertension: Secondary | ICD-10-CM | POA: Diagnosis not present

## 2017-03-26 DIAGNOSIS — Z794 Long term (current) use of insulin: Secondary | ICD-10-CM

## 2017-03-26 DIAGNOSIS — E119 Type 2 diabetes mellitus without complications: Secondary | ICD-10-CM

## 2017-03-26 DIAGNOSIS — E7800 Pure hypercholesterolemia, unspecified: Secondary | ICD-10-CM

## 2017-03-26 LAB — COMPREHENSIVE METABOLIC PANEL
ALT: 29 U/L (ref 0–53)
AST: 36 U/L (ref 0–37)
Albumin: 4.5 g/dL (ref 3.5–5.2)
Alkaline Phosphatase: 40 U/L (ref 39–117)
BILIRUBIN TOTAL: 0.7 mg/dL (ref 0.2–1.2)
BUN: 37 mg/dL — ABNORMAL HIGH (ref 6–23)
CALCIUM: 9.6 mg/dL (ref 8.4–10.5)
CO2: 28 meq/L (ref 19–32)
Chloride: 103 mEq/L (ref 96–112)
Creatinine, Ser: 2.09 mg/dL — ABNORMAL HIGH (ref 0.40–1.50)
GFR: 32.83 mL/min — AB (ref >60.00)
Glucose, Bld: 184 mg/dL — ABNORMAL HIGH (ref 70–99)
POTASSIUM: 5.1 meq/L (ref 3.5–5.1)
Sodium: 136 mEq/L (ref 135–145)
Total Protein: 6.9 g/dL (ref 6.0–8.3)

## 2017-03-26 LAB — HEMOGLOBIN A1C: Hgb A1c MFr Bld: 7.3 % — ABNORMAL HIGH (ref 4.6–6.5)

## 2017-03-26 MED ORDER — LANTUS 100 UNIT/ML ~~LOC~~ SOLN: SUBCUTANEOUS | 3 refills | Status: DC

## 2017-03-26 NOTE — Progress Notes (Signed)
Pre visit review using our clinic review tool, if applicable. No additional management support is needed unless otherwise documented below in the visit note. 
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
## 2017-03-26 NOTE — Patient Instructions (Signed)
Increase your lantus insulin to 39 units every morning and to 28 units every evening.  Stop your atorvastatin (generic lipitor) for 2 weeks.  If your legs weakness gets significantly better, restart the atorvastatin and see if leg weakness returns.  If it returns, call and tell us and I'll prescribe a different medication for cholesterol.

## 2017-04-10 ENCOUNTER — Ambulatory Visit (INDEPENDENT_AMBULATORY_CARE_PROVIDER_SITE_OTHER): Payer: Medicare Other | Admitting: Family Medicine

## 2017-04-10 ENCOUNTER — Encounter: Payer: Self-pay | Admitting: Family Medicine

## 2017-04-10 VITALS — BP 126/69 | HR 80 | Temp 97.8°F | Resp 16 | Ht 72.0 in | Wt 228.8 lb

## 2017-04-10 DIAGNOSIS — R6884 Jaw pain: Secondary | ICD-10-CM

## 2017-04-10 DIAGNOSIS — K118 Other diseases of salivary glands: Secondary | ICD-10-CM | POA: Diagnosis not present

## 2017-04-10 MED ORDER — LEVOFLOXACIN 750 MG PO TABS: ORAL_TABLET | ORAL | 0 refills | Status: DC
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
## 2017-04-24 ENCOUNTER — Encounter: Payer: Self-pay | Admitting: Family Medicine

## 2017-04-24 ENCOUNTER — Ambulatory Visit (INDEPENDENT_AMBULATORY_CARE_PROVIDER_SITE_OTHER): Payer: Medicare Other | Admitting: Family Medicine

## 2017-04-24 VITALS — BP 125/70 | HR 79 | Temp 97.4°F | Resp 16 | Ht 72.0 in | Wt 229.2 lb

## 2017-04-24 DIAGNOSIS — R6884 Jaw pain: Secondary | ICD-10-CM
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
## 2017-04-28 ENCOUNTER — Other Ambulatory Visit: Payer: Self-pay | Admitting: Family Medicine

## 2017-06-26 ENCOUNTER — Encounter: Payer: Self-pay | Admitting: Family Medicine

## 2017-06-26 ENCOUNTER — Ambulatory Visit (INDEPENDENT_AMBULATORY_CARE_PROVIDER_SITE_OTHER): Payer: Medicare Other | Admitting: Family Medicine

## 2017-06-26 VITALS — BP 121/71 | HR 76 | Temp 97.6°F | Resp 16 | Ht 72.0 in | Wt 231.2 lb

## 2017-06-26 DIAGNOSIS — N183 Chronic kidney disease, stage 3 unspecified: Secondary | ICD-10-CM

## 2017-06-26 DIAGNOSIS — E119 Type 2 diabetes mellitus without complications: Secondary | ICD-10-CM | POA: Diagnosis not present

## 2017-06-26 DIAGNOSIS — I10 Essential (primary) hypertension: Secondary | ICD-10-CM | POA: Diagnosis not present

## 2017-06-26 DIAGNOSIS — E7800 Pure hypercholesterolemia, unspecified: Secondary | ICD-10-CM | POA: Diagnosis not present

## 2017-06-26 LAB — BASIC METABOLIC PANEL
BUN: 35 mg/dL — ABNORMAL HIGH (ref 6–23)
CHLORIDE: 105 meq/L (ref 96–112)
CO2: 29 meq/L (ref 19–32)
CREATININE: 2.01 mg/dL — AB (ref 0.40–1.50)
Calcium: 9.6 mg/dL (ref 8.4–10.5)
GFR: 34.32 mL/min — ABNORMAL LOW (ref >60.00)
Glucose, Bld: 120 mg/dL — ABNORMAL HIGH (ref 70–99)
Potassium: 4.7 mEq/L (ref 3.5–5.1)
Sodium: 137 mEq/L (ref 135–145)

## 2017-06-26 LAB — MICROALBUMIN / CREATININE URINE RATIO
Creatinine,U: 155.6 mg/dL
MICROALB UR: 0.8 mg/dL (ref 0.0–1.9)
Microalb Creat Ratio: 0.5 mg/g (ref 0.0–30.0)

## 2017-06-26 LAB — HEMOGLOBIN A1C: Hgb A1c MFr Bld: 6.9 % — ABNORMAL HIGH (ref 4.6–6.5)
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
## 2017-07-18 ENCOUNTER — Other Ambulatory Visit: Payer: Self-pay

## 2017-07-18 MED ORDER — ATORVASTATIN CALCIUM 20 MG PO TABS: 20.0000 mg | ORAL_TABLET | Freq: Every day | ORAL | 2 refills | Status: DC

## 2017-07-18 NOTE — Telephone Encounter (Signed)
Refill on Atorvastatin sent to pharmacy.

## 2017-09-16 ENCOUNTER — Other Ambulatory Visit: Payer: Self-pay | Admitting: Family Medicine

## 2017-09-26 ENCOUNTER — Ambulatory Visit (INDEPENDENT_AMBULATORY_CARE_PROVIDER_SITE_OTHER): Payer: Medicare Other | Admitting: Family Medicine

## 2017-09-26 ENCOUNTER — Encounter: Payer: Self-pay | Admitting: Family Medicine

## 2017-09-26 ENCOUNTER — Other Ambulatory Visit: Payer: Self-pay

## 2017-09-26 VITALS — BP 125/71 | HR 76 | Temp 97.5°F | Resp 16 | Ht 72.0 in | Wt 232.8 lb

## 2017-09-26 DIAGNOSIS — E118 Type 2 diabetes mellitus with unspecified complications: Secondary | ICD-10-CM | POA: Diagnosis not present

## 2017-09-26 DIAGNOSIS — E7800 Pure hypercholesterolemia, unspecified: Secondary | ICD-10-CM

## 2017-09-26 DIAGNOSIS — Z23 Encounter for immunization: Secondary | ICD-10-CM

## 2017-09-26 DIAGNOSIS — N183 Chronic kidney disease, stage 3 unspecified: Secondary | ICD-10-CM

## 2017-09-26 DIAGNOSIS — I10 Essential (primary) hypertension: Secondary | ICD-10-CM | POA: Diagnosis not present

## 2017-09-26 DIAGNOSIS — Z794 Long term (current) use of insulin: Secondary | ICD-10-CM

## 2017-09-26 LAB — POCT GLYCOSYLATED HEMOGLOBIN (HGB A1C): Hemoglobin A1C: 6.4
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
## 2017-09-29 ENCOUNTER — Encounter: Payer: Self-pay | Admitting: Family Medicine

## 2017-10-21 ENCOUNTER — Other Ambulatory Visit: Payer: Self-pay | Admitting: Family Medicine

## 2017-10-22 NOTE — Telephone Encounter (Signed)
[facility]  RF request for allopurinol LOV: 09/26/17 Next ov: 12/25/17 Last written: 10/28/16 #90 w/ 3RF  Please advise. Thanks.

## 2017-12-25 ENCOUNTER — Ambulatory Visit (INDEPENDENT_AMBULATORY_CARE_PROVIDER_SITE_OTHER): Payer: Medicare Other | Admitting: Family Medicine

## 2017-12-25 ENCOUNTER — Encounter: Payer: Self-pay | Admitting: Family Medicine

## 2017-12-25 VITALS — BP 133/84 | HR 82 | Temp 97.7°F | Wt 232.0 lb

## 2017-12-25 DIAGNOSIS — E7800 Pure hypercholesterolemia, unspecified: Secondary | ICD-10-CM | POA: Diagnosis not present

## 2017-12-25 DIAGNOSIS — N183 Chronic kidney disease, stage 3 unspecified: Secondary | ICD-10-CM

## 2017-12-25 DIAGNOSIS — I10 Essential (primary) hypertension: Secondary | ICD-10-CM | POA: Diagnosis not present

## 2017-12-25 DIAGNOSIS — E118 Type 2 diabetes mellitus with unspecified complications: Secondary | ICD-10-CM | POA: Diagnosis not present

## 2017-12-25 LAB — BASIC METABOLIC PANEL
BUN: 47 mg/dL — ABNORMAL HIGH (ref 6–23)
CALCIUM: 9.6 mg/dL (ref 8.4–10.5)
CHLORIDE: 104 meq/L (ref 96–112)
CO2: 26 meq/L (ref 19–32)
Creatinine, Ser: 2.18 mg/dL — ABNORMAL HIGH (ref 0.40–1.50)
GFR: 31.21 mL/min — ABNORMAL LOW (ref >60.00)
Glucose, Bld: 134 mg/dL — ABNORMAL HIGH (ref 70–99)
Potassium: 5 mEq/L (ref 3.5–5.1)
SODIUM: 137 meq/L (ref 135–145)

## 2017-12-25 LAB — HEMOGLOBIN A1C: HEMOGLOBIN A1C: 6.4 % (ref 4.6–6.5)

## 2017-12-25 LAB — LIPID PANEL
Cholesterol: 87 mg/dL (ref 0–200)
HDL: 43.6 mg/dL (ref >39.00)
LDL Cholesterol: 30 mg/dL (ref 0–99)
NonHDL: 43.74
Total CHOL/HDL Ratio: 2
Triglycerides: 67 mg/dL (ref 0.0–149.0)
VLDL: 13.4 mg/dL (ref 0.0–40.0)
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
## 2018-01-14 LAB — HM DIABETES EYE EXAM

## 2018-01-16 ENCOUNTER — Encounter: Payer: Self-pay | Admitting: Family Medicine

## 2018-01-21 ENCOUNTER — Other Ambulatory Visit: Payer: Self-pay | Admitting: Family Medicine

## 2018-03-23 ENCOUNTER — Telehealth: Payer: Self-pay

## 2018-03-23 NOTE — Telephone Encounter (Signed)
SW pt regarding AWV, declines at this time.

## 2018-03-24 ENCOUNTER — Ambulatory Visit (INDEPENDENT_AMBULATORY_CARE_PROVIDER_SITE_OTHER): Payer: Medicare Other | Admitting: Family Medicine

## 2018-03-24 ENCOUNTER — Encounter: Payer: Self-pay | Admitting: Family Medicine

## 2018-03-24 VITALS — BP 130/76 | HR 63 | Temp 98.1°F | Resp 16 | Ht 72.0 in | Wt 231.4 lb

## 2018-03-24 DIAGNOSIS — E669 Obesity, unspecified: Secondary | ICD-10-CM

## 2018-03-24 DIAGNOSIS — E118 Type 2 diabetes mellitus with unspecified complications: Secondary | ICD-10-CM

## 2018-03-24 DIAGNOSIS — N183 Chronic kidney disease, stage 3 unspecified: Secondary | ICD-10-CM

## 2018-03-24 DIAGNOSIS — I10 Essential (primary) hypertension: Secondary | ICD-10-CM | POA: Diagnosis not present

## 2018-03-24 LAB — BASIC METABOLIC PANEL
BUN: 39 mg/dL — AB (ref 6–23)
CO2: 27 mEq/L (ref 19–32)
CREATININE: 2.02 mg/dL — AB (ref 0.40–1.50)
Calcium: 9.7 mg/dL (ref 8.4–10.5)
Chloride: 103 mEq/L (ref 96–112)
GFR: 34.06 mL/min — AB (ref >60.00)
GLUCOSE: 120 mg/dL — AB (ref 70–99)
POTASSIUM: 4.8 meq/L (ref 3.5–5.1)
Sodium: 138 mEq/L (ref 135–145)

## 2018-03-24 LAB — HEMOGLOBIN A1C: Hgb A1c MFr Bld: 6.5 % (ref 4.6–6.5)

## 2018-03-24 MED ORDER — FENOFIBRATE 54 MG PO TABS: ORAL_TABLET | ORAL | 3 refills | Status: DC
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
## 2018-06-24 ENCOUNTER — Ambulatory Visit (INDEPENDENT_AMBULATORY_CARE_PROVIDER_SITE_OTHER): Payer: Medicare Other | Admitting: Family Medicine

## 2018-06-24 ENCOUNTER — Encounter: Payer: Self-pay | Admitting: Family Medicine

## 2018-06-24 VITALS — BP 127/73 | HR 75 | Temp 97.8°F | Resp 16 | Ht 72.0 in | Wt 233.2 lb

## 2018-06-24 DIAGNOSIS — E782 Mixed hyperlipidemia: Secondary | ICD-10-CM

## 2018-06-24 DIAGNOSIS — N183 Chronic kidney disease, stage 3 unspecified: Secondary | ICD-10-CM

## 2018-06-24 DIAGNOSIS — I10 Essential (primary) hypertension: Secondary | ICD-10-CM

## 2018-06-24 DIAGNOSIS — E118 Type 2 diabetes mellitus with unspecified complications: Secondary | ICD-10-CM | POA: Diagnosis not present

## 2018-06-24 DIAGNOSIS — E669 Obesity, unspecified: Secondary | ICD-10-CM | POA: Diagnosis not present

## 2018-06-24 LAB — COMPREHENSIVE METABOLIC PANEL
ALBUMIN: 4.3 g/dL (ref 3.5–5.2)
ALK PHOS: 35 U/L — AB (ref 39–117)
ALT: 29 U/L (ref 0–53)
AST: 32 U/L (ref 0–37)
BILIRUBIN TOTAL: 0.7 mg/dL (ref 0.2–1.2)
BUN: 35 mg/dL — AB (ref 6–23)
CO2: 28 mEq/L (ref 19–32)
Calcium: 9.8 mg/dL (ref 8.4–10.5)
Chloride: 104 mEq/L (ref 96–112)
Creatinine, Ser: 2.01 mg/dL — ABNORMAL HIGH (ref 0.40–1.50)
GFR: 34.23 mL/min — ABNORMAL LOW (ref >60.00)
GLUCOSE: 126 mg/dL — AB (ref 70–99)
POTASSIUM: 4.8 meq/L (ref 3.5–5.1)
SODIUM: 137 meq/L (ref 135–145)
Total Protein: 6.6 g/dL (ref 6.0–8.3)

## 2018-06-24 LAB — HEMOGLOBIN A1C: HEMOGLOBIN A1C: 6.9 % — AB (ref 4.6–6.5)

## 2018-06-24 LAB — MICROALBUMIN / CREATININE URINE RATIO
Creatinine,U: 136.8 mg/dL
MICROALB UR: 2 mg/dL — AB (ref 0.0–1.9)
Microalb Creat Ratio: 1.5 mg/g (ref 0.0–30.0)
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
## 2018-06-25 ENCOUNTER — Other Ambulatory Visit: Payer: Self-pay | Admitting: Family Medicine

## 2018-07-29 ENCOUNTER — Ambulatory Visit (INDEPENDENT_AMBULATORY_CARE_PROVIDER_SITE_OTHER): Payer: Medicare Other | Admitting: Orthopaedic Surgery

## 2018-07-29 ENCOUNTER — Ambulatory Visit (INDEPENDENT_AMBULATORY_CARE_PROVIDER_SITE_OTHER): Payer: Medicare Other

## 2018-07-29 ENCOUNTER — Other Ambulatory Visit (INDEPENDENT_AMBULATORY_CARE_PROVIDER_SITE_OTHER): Payer: Self-pay

## 2018-07-29 DIAGNOSIS — M5442 Lumbago with sciatica, left side: Secondary | ICD-10-CM | POA: Diagnosis not present

## 2018-07-29 DIAGNOSIS — G8929 Other chronic pain: Secondary | ICD-10-CM

## 2018-07-29 DIAGNOSIS — M5441 Lumbago with sciatica, right side: Secondary | ICD-10-CM

## 2018-07-29 MED ORDER — METHOCARBAMOL 500 MG PO TABS: 500.0000 mg | ORAL_TABLET | Freq: Four times a day (QID) | ORAL | 0 refills | Status: DC | PRN
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
## 2018-08-10 ENCOUNTER — Encounter (INDEPENDENT_AMBULATORY_CARE_PROVIDER_SITE_OTHER): Payer: Self-pay | Admitting: Physical Medicine and Rehabilitation

## 2018-08-10 ENCOUNTER — Ambulatory Visit (INDEPENDENT_AMBULATORY_CARE_PROVIDER_SITE_OTHER): Payer: Self-pay

## 2018-08-10 ENCOUNTER — Ambulatory Visit (INDEPENDENT_AMBULATORY_CARE_PROVIDER_SITE_OTHER): Payer: Medicare Other | Admitting: Physical Medicine and Rehabilitation

## 2018-08-10 VITALS — BP 157/84 | HR 81 | Temp 97.9°F

## 2018-08-10 DIAGNOSIS — M47816 Spondylosis without myelopathy or radiculopathy, lumbar region: Secondary | ICD-10-CM

## 2018-08-10 DIAGNOSIS — G8929 Other chronic pain: Secondary | ICD-10-CM | POA: Diagnosis not present

## 2018-08-10 DIAGNOSIS — M961 Postlaminectomy syndrome, not elsewhere classified: Secondary | ICD-10-CM | POA: Diagnosis not present

## 2018-08-10 DIAGNOSIS — M545 Low back pain: Secondary | ICD-10-CM

## 2018-08-10 MED ORDER — METHYLPREDNISOLONE ACETATE 80 MG/ML IJ SUSP
80.0000 mg | Freq: Once | INTRAMUSCULAR | Status: AC
  Administered 2018-08-10: 80 mg

## 2018-08-10 NOTE — Progress Notes (Signed)
 .  Numeric Pain Rating Scale and Functional Assessment Average Pain 8   In the last MONTH (on 0-10 scale) has pain interfered with the following?  1. General activity like being  able to carry out your everyday physical activities such as walking, climbing stairs, carrying groceries, or moving a chair?  Rating(7)   +Driver, -BT, +Dye Allergies(Ivp).

## 2018-08-10 NOTE — Patient Instructions (Signed)

## 2018-08-18 NOTE — Procedures (Signed)
Lumbar Facet Joint Intra-Articular Injection(s) with Fluoroscopic Guidance  Patient: Dave Wright      Date of Birth: Mar 04, 1941 MRN: [id number] PCP: Tammi Sou, MD      Visit Date: 08/10/2018   Universal Protocol:    Date/Time: 08/10/2018  Consent Given By: the patient  Position: PRONE   Additional Comments: Vital signs were monitored before and after the procedure. Patient was prepped and draped in the usual sterile fashion. The correct patient, procedure, and site was verified.   Injection Procedure Details:  Procedure Site One Meds Administered:  Meds ordered this encounter  Medications  . methylPREDNISolone acetate (DEPO-MEDROL) injection 80 mg     Laterality: Right  Location/Site:  L4-L5 L5-S1  Needle size: 22 guage  Needle type: Spinal  Needle Placement: Articular  Findings:  -Comments: Excellent flow of contrast producing a partial arthrogram.  Procedure Details: The fluoroscope beam is vertically oriented in AP, and the inferior recess is visualized beneath the lower pole of the inferior apophyseal process, which represents the target point for needle insertion. When direct visualization is difficult the target point is located at the medial projection of the vertebral pedicle. The region overlying each aforementioned target is locally anesthetized with a 1 to 2 ml. volume of 1% Lidocaine without Epinephrine.   The spinal needle was inserted into each of the above mentioned facet joints using biplanar fluoroscopic guidance. A 0.25 to 0.5 ml. volume of Omniscan was injected and a partial facet joint arthrogram was obtained. A single spot film was obtained of the resulting arthrogram.    One to 1.25 ml of the steroid/anesthetic solution was then injected into each of the facet joints noted above.   Additional Comments:  The patient tolerated the procedure well Dressing: Band-Aid    Post-procedure details: Patient was observed during the  procedure. Post-procedure instructions were reviewed.  Patient left the clinic in stable condition.

## 2018-08-18 NOTE — Progress Notes (Signed)
Dave Wright - 77 y.o. male MRN [id number]  Date of birth: Aug 03, 1941  Office Visit Note: Visit Date: 08/10/2018 PCP: Dave Sou, MD Referred by: Dave Sou, MD  Subjective: Chief Complaint  Patient presents with  . Lower Back - Pain   HPI: Dave Wright is a 77 year old gentleman that comes in today at the request of Dr. Jean Wright for diagnostic of therapeutic facet joint block.  His history is that he has had chronic back pain for many years and has had prior lumbar surgery with sounds like it was a laminectomy decompression done remotely in 1995.  He has had no recent trauma and no radicular complaints.  He said physical therapy and does continue with exercises.  He is not able to take nonsteroidal anti-inflammatories.  Is been followed by Dr. Ninfa Wright and felt like facet joint blocks may help him.  He has not had recent MRI imaging but has had a recent x-rays.  X-rays show multilevel facet arthropathy worse at L4-5 and L5-S1.  Most of his pain is right-sided not left-sided.  I think it would be beneficial to complete diagnostic blocks at L4-5 and L5-S1 on the right.  Could look at possible radiofrequency ablation.  Patient's history complicated by diabetes as well as contrast dye allergy.   ROS Otherwise per HPI.  Assessment & Plan: Visit Diagnoses:  1. Spondylosis without myelopathy or radiculopathy, lumbar region   2. Chronic right-sided low back pain without sciatica   3. Post laminectomy syndrome     Plan: No additional findings.   Meds & Orders:  Meds ordered this encounter  Medications  . methylPREDNISolone acetate (DEPO-MEDROL) injection 80 mg    Orders Placed This Encounter  Procedures  . Facet Injection  . XR C-ARM NO REPORT    Follow-up: Return if symptoms worsen or fail to improve.   Procedures: No procedures performed  Lumbar Facet Joint Intra-Articular Injection(s) with Fluoroscopic Guidance  Patient: Dave Wright      Date  of Birth: 03/24/1941 MRN: [id number] PCP: Dave Sou, MD      Visit Date: 08/10/2018   Universal Protocol:    Date/Time: 08/10/2018  Consent Given By: the patient  Position: PRONE   Additional Comments: Vital signs were monitored before and after the procedure. Patient was prepped and draped in the usual sterile fashion. The correct patient, procedure, and site was verified.   Injection Procedure Details:  Procedure Site One Meds Administered:  Meds ordered this encounter  Medications  . methylPREDNISolone acetate (DEPO-MEDROL) injection 80 mg     Laterality: Right  Location/Site:  L4-L5 L5-S1  Needle size: 22 guage  Needle type: Spinal  Needle Placement: Articular  Findings:  -Comments: Excellent flow of contrast producing a partial arthrogram.  Procedure Details: The fluoroscope beam is vertically oriented in AP, and the inferior recess is visualized beneath the lower pole of the inferior apophyseal process, which represents the target point for needle insertion. When direct visualization is difficult the target point is located at the medial projection of the vertebral pedicle. The region overlying each aforementioned target is locally anesthetized with a 1 to 2 ml. volume of 1% Lidocaine without Epinephrine.   The spinal needle was inserted into each of the above mentioned facet joints using biplanar fluoroscopic guidance. A 0.25 to 0.5 ml. volume of Omniscan was injected and a partial facet joint arthrogram was obtained. A single spot film was obtained of the resulting arthrogram.    One to  1.25 ml of the steroid/anesthetic solution was then injected into each of the facet joints noted above.   Additional Comments:  The patient tolerated the procedure well Dressing: Band-Aid    Post-procedure details: Patient was observed during the procedure. Post-procedure instructions were reviewed.  Patient left the clinic in stable condition.     Clinical  History: No specialty comments available.     Objective:  VS:  HT:    WT:   BMI:     BP:(!) 157/84  HR:81bpm  TEMP:97.9 F (36.6 C)(Oral)  RESP:  Physical Exam  Ortho Exam Imaging: No results found.

## 2018-08-26 ENCOUNTER — Encounter (INDEPENDENT_AMBULATORY_CARE_PROVIDER_SITE_OTHER): Payer: Self-pay | Admitting: Orthopaedic Surgery

## 2018-08-26 ENCOUNTER — Ambulatory Visit (INDEPENDENT_AMBULATORY_CARE_PROVIDER_SITE_OTHER): Payer: Medicare Other | Admitting: Orthopaedic Surgery

## 2018-08-26 DIAGNOSIS — M47819 Spondylosis without myelopathy or radiculopathy, site unspecified: Secondary | ICD-10-CM

## 2018-08-26 NOTE — Progress Notes (Signed)
HPI: [name] returns today follow-up facet injections with Dr. Ernestina Patches on 08/10/2018 of the lumbar spine.  States these were very helpful.  His pain is 90% less than what he was having.  He has dull achy pain in the low back is constant worse with lying down or sitting at her with standing.  He denies any radicular symptoms down either leg.  He has been to physical therapy in the past and states he knows he needs to stretch.  He states that he can live with the pain is having now.  Review of systems: Please see HPI otherwise negative  Physical exam: General well-developed well-nourished male in no acute distress. Psych: Alert and oriented x3 Lower extremities: Negative straight leg raise bilaterally tight hamstrings right greater than left.  He is comes within a foot of touching his toes.  No significant pain with this.  Impression: Spondylosis without radiculopathy  Plan: Encouraged him to work on stretching particularly his hamstrings he has exercises handouts at home that he is gotten from physical therapy in the past which he can do on his own.  Offered repeat physical therapy he defers. We will see him back on an as-needed basis.  If his low back pain returns similar to what he had prior to the facet injections with Dr. Ernestina Patches and will keep to simply set up facet injections however if he develops any radicular symptoms or any change in pain we will repeat his MRI.  Questions encouraged and answered.

## 2018-09-19 ENCOUNTER — Other Ambulatory Visit: Payer: Self-pay | Admitting: Family Medicine

## 2018-09-28 ENCOUNTER — Other Ambulatory Visit: Payer: Self-pay | Admitting: Family Medicine

## 2018-09-28 ENCOUNTER — Ambulatory Visit (INDEPENDENT_AMBULATORY_CARE_PROVIDER_SITE_OTHER): Payer: Medicare Other | Admitting: Family Medicine

## 2018-09-28 ENCOUNTER — Encounter: Payer: Self-pay | Admitting: Family Medicine

## 2018-09-28 VITALS — BP 117/70 | HR 76 | Temp 97.9°F | Resp 16 | Ht 72.0 in | Wt 234.2 lb

## 2018-09-28 DIAGNOSIS — Z23 Encounter for immunization: Secondary | ICD-10-CM | POA: Diagnosis not present

## 2018-09-28 DIAGNOSIS — E118 Type 2 diabetes mellitus with unspecified complications: Secondary | ICD-10-CM

## 2018-09-28 DIAGNOSIS — N183 Chronic kidney disease, stage 3 unspecified: Secondary | ICD-10-CM

## 2018-09-28 DIAGNOSIS — E669 Obesity, unspecified: Secondary | ICD-10-CM

## 2018-09-28 DIAGNOSIS — E782 Mixed hyperlipidemia: Secondary | ICD-10-CM

## 2018-09-28 DIAGNOSIS — I10 Essential (primary) hypertension: Secondary | ICD-10-CM

## 2018-09-28 LAB — BASIC METABOLIC PANEL
BUN: 39 mg/dL — AB (ref 6–23)
CHLORIDE: 107 meq/L (ref 96–112)
CO2: 27 mEq/L (ref 19–32)
Calcium: 9.8 mg/dL (ref 8.4–10.5)
Creatinine, Ser: 1.87 mg/dL — ABNORMAL HIGH (ref 0.40–1.50)
GFR: 37.18 mL/min — AB (ref >60.00)
Glucose, Bld: 134 mg/dL — ABNORMAL HIGH (ref 70–99)
POTASSIUM: 4.6 meq/L (ref 3.5–5.1)
SODIUM: 139 meq/L (ref 135–145)

## 2018-09-28 LAB — HEMOGLOBIN A1C: HEMOGLOBIN A1C: 6.9 % — AB (ref 4.6–6.5)

## 2018-09-28 MED ORDER — ZOSTER VAC RECOMB ADJUVANTED 50 MCG/0.5ML IM SUSR: 0.5000 mL | Freq: Once | INTRAMUSCULAR | 1 refills | Status: AC
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
## 2018-09-28 NOTE — Addendum Note (Signed)
Addended by: Onalee Hua on: 09/28/2018 10:44 AM   Modules accepted: Orders

## 2018-09-29 ENCOUNTER — Encounter: Payer: Self-pay | Admitting: *Deleted

## 2018-09-29 ENCOUNTER — Encounter: Payer: Self-pay | Admitting: Family Medicine

## 2018-11-29 ENCOUNTER — Other Ambulatory Visit: Payer: Self-pay | Admitting: Family Medicine

## 2018-12-02 LAB — HM DIABETES EYE EXAM

## 2018-12-03 ENCOUNTER — Encounter: Payer: Self-pay | Admitting: Family Medicine

## 2018-12-30 ENCOUNTER — Ambulatory Visit (INDEPENDENT_AMBULATORY_CARE_PROVIDER_SITE_OTHER): Payer: Medicare Other | Admitting: Family Medicine

## 2018-12-30 ENCOUNTER — Encounter: Payer: Self-pay | Admitting: Family Medicine

## 2018-12-30 VITALS — BP 121/71 | HR 78 | Temp 97.6°F | Resp 16 | Ht 72.0 in | Wt 232.2 lb

## 2018-12-30 DIAGNOSIS — I10 Essential (primary) hypertension: Secondary | ICD-10-CM

## 2018-12-30 DIAGNOSIS — N183 Chronic kidney disease, stage 3 unspecified: Secondary | ICD-10-CM

## 2018-12-30 DIAGNOSIS — E118 Type 2 diabetes mellitus with unspecified complications: Secondary | ICD-10-CM | POA: Diagnosis not present

## 2018-12-30 DIAGNOSIS — N2889 Other specified disorders of kidney and ureter: Secondary | ICD-10-CM

## 2018-12-30 DIAGNOSIS — E7800 Pure hypercholesterolemia, unspecified: Secondary | ICD-10-CM

## 2018-12-30 LAB — LIPID PANEL
CHOLESTEROL: 104 mg/dL (ref 0–200)
HDL: 42.6 mg/dL (ref >39.00)
LDL Cholesterol: 45 mg/dL (ref 0–99)
NonHDL: 61.08
TRIGLYCERIDES: 80 mg/dL (ref 0.0–149.0)
Total CHOL/HDL Ratio: 2
VLDL: 16 mg/dL (ref 0.0–40.0)

## 2018-12-30 LAB — BASIC METABOLIC PANEL
BUN: 42 mg/dL — AB (ref 6–23)
CHLORIDE: 104 meq/L (ref 96–112)
CO2: 27 mEq/L (ref 19–32)
CREATININE: 1.99 mg/dL — AB (ref 0.40–1.50)
Calcium: 9.7 mg/dL (ref 8.4–10.5)
GFR: 32.54 mL/min — ABNORMAL LOW (ref >60.00)
GLUCOSE: 125 mg/dL — AB (ref 70–99)
POTASSIUM: 5 meq/L (ref 3.5–5.1)
Sodium: 137 mEq/L (ref 135–145)

## 2018-12-30 LAB — HEMOGLOBIN A1C: Hgb A1c MFr Bld: 7 % — ABNORMAL HIGH (ref 4.6–6.5)
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
## 2019-01-15 ENCOUNTER — Other Ambulatory Visit: Payer: Self-pay | Admitting: Family Medicine

## 2019-02-21 ENCOUNTER — Other Ambulatory Visit: Payer: Self-pay | Admitting: Family Medicine

## 2019-02-22 ENCOUNTER — Other Ambulatory Visit: Payer: Self-pay
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
## 2019-04-01 ENCOUNTER — Ambulatory Visit (INDEPENDENT_AMBULATORY_CARE_PROVIDER_SITE_OTHER): Payer: Medicare Other | Admitting: Family Medicine

## 2019-04-01 ENCOUNTER — Encounter: Payer: Self-pay | Admitting: Family Medicine

## 2019-04-01 ENCOUNTER — Ambulatory Visit: Payer: Medicare Other | Admitting: Family Medicine

## 2019-04-01 VITALS — BP 168/85 | HR 74

## 2019-04-01 DIAGNOSIS — I10 Essential (primary) hypertension: Secondary | ICD-10-CM

## 2019-04-01 DIAGNOSIS — E118 Type 2 diabetes mellitus with unspecified complications: Secondary | ICD-10-CM | POA: Diagnosis not present

## 2019-04-01 DIAGNOSIS — E782 Mixed hyperlipidemia: Secondary | ICD-10-CM

## 2019-04-01 DIAGNOSIS — N183 Chronic kidney disease, stage 3 unspecified: Secondary | ICD-10-CM

## 2019-04-23 ENCOUNTER — Other Ambulatory Visit: Payer: Self-pay

## 2019-04-23 ENCOUNTER — Ambulatory Visit (INDEPENDENT_AMBULATORY_CARE_PROVIDER_SITE_OTHER): Payer: Medicare Other | Admitting: Family Medicine

## 2019-04-23 DIAGNOSIS — I10 Essential (primary) hypertension: Secondary | ICD-10-CM | POA: Diagnosis not present

## 2019-04-23 DIAGNOSIS — N183 Chronic kidney disease, stage 3 unspecified: Secondary | ICD-10-CM

## 2019-04-23 DIAGNOSIS — E118 Type 2 diabetes mellitus with unspecified complications: Secondary | ICD-10-CM

## 2019-04-23 LAB — BASIC METABOLIC PANEL
BUN: 43 mg/dL — ABNORMAL HIGH (ref 6–23)
CO2: 26 mEq/L (ref 19–32)
Calcium: 9.6 mg/dL (ref 8.4–10.5)
Chloride: 103 mEq/L (ref 96–112)
Creatinine, Ser: 2.03 mg/dL — ABNORMAL HIGH (ref 0.40–1.50)
GFR: 31.78 mL/min — ABNORMAL LOW (ref >60.00)
Glucose, Bld: 112 mg/dL — ABNORMAL HIGH (ref 70–99)
Potassium: 4.9 mEq/L (ref 3.5–5.1)
Sodium: 137 mEq/L (ref 135–145)

## 2019-04-23 LAB — HEMOGLOBIN A1C: Hgb A1c MFr Bld: 7.1 % — ABNORMAL HIGH (ref 4.6–6.5)

## 2019-04-28 ENCOUNTER — Telehealth: Payer: Self-pay

## 2019-04-28 NOTE — Telephone Encounter (Signed)
My Chart message sent

## 2019-05-20 ENCOUNTER — Other Ambulatory Visit: Payer: Self-pay | Admitting: Family Medicine

## 2019-06-09 ENCOUNTER — Other Ambulatory Visit: Payer: Self-pay | Admitting: Family Medicine

## 2019-06-10 ENCOUNTER — Telehealth: Payer: Self-pay | Admitting: Physical Medicine and Rehabilitation

## 2019-06-10 NOTE — Telephone Encounter (Signed)
Scheduled for 8/6 at 0930.

## 2019-06-10 NOTE — Telephone Encounter (Signed)
ok 

## 2019-06-24 ENCOUNTER — Ambulatory Visit (INDEPENDENT_AMBULATORY_CARE_PROVIDER_SITE_OTHER): Payer: Medicare Other | Admitting: Physical Medicine and Rehabilitation

## 2019-06-24 ENCOUNTER — Ambulatory Visit: Payer: Self-pay

## 2019-06-24 ENCOUNTER — Encounter: Payer: Self-pay | Admitting: Physical Medicine and Rehabilitation

## 2019-06-24 VITALS — BP 144/80 | HR 82

## 2019-06-24 DIAGNOSIS — S39012A Strain of muscle, fascia and tendon of lower back, initial encounter: Secondary | ICD-10-CM

## 2019-06-24 DIAGNOSIS — M47816 Spondylosis without myelopathy or radiculopathy, lumbar region: Secondary | ICD-10-CM | POA: Diagnosis not present

## 2019-06-24 DIAGNOSIS — M25561 Pain in right knee: Secondary | ICD-10-CM | POA: Diagnosis not present

## 2019-06-24 DIAGNOSIS — G8929 Other chronic pain: Secondary | ICD-10-CM

## 2019-06-24 MED ORDER — METHYLPREDNISOLONE ACETATE 80 MG/ML IJ SUSP
80.0000 mg | Freq: Once | INTRAMUSCULAR | Status: AC
  Administered 2019-06-24: 09:00:00 80 mg
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
## 2019-06-24 NOTE — Procedures (Signed)
Lumbar Facet Joint Intra-Articular Injection(s) with Fluoroscopic Guidance  Patient: Dave Wright      Date of Birth: 1941/05/03 MRN: [id number] PCP: Tammi Sou, MD      Visit Date: 06/24/2019   Universal Protocol:    Date/Time: 06/24/2019  Consent Given By: the patient  Position: PRONE   Additional Comments: Vital signs were monitored before and after the procedure. Patient was prepped and draped in the usual sterile fashion. The correct patient, procedure, and site was verified.   Injection Procedure Details:  Procedure Site One Meds Administered:  Meds ordered this encounter  Medications  . methylPREDNISolone acetate (DEPO-MEDROL) injection 80 mg     Laterality: Right  Location/Site:  L4-L5 L5-S1  Needle size: 22 guage  Needle type: Spinal  Needle Placement: Articular  Findings:  -Comments: Excellent flow of contrast producing a partial arthrogram.  Procedure Details: The fluoroscope beam is vertically oriented in AP, and the inferior recess is visualized beneath the lower pole of the inferior apophyseal process, which represents the target point for needle insertion. When direct visualization is difficult the target point is located at the medial projection of the vertebral pedicle. The region overlying each aforementioned target is locally anesthetized with a 1 to 2 ml. volume of 1% Lidocaine without Epinephrine.   The spinal needle was inserted into each of the above mentioned facet joints using biplanar fluoroscopic guidance. A 0.25 to 0.5 ml. volume of Omniscan was injected and a partial facet joint arthrogram was obtained. A single spot film was obtained of the resulting arthrogram.    One to 1.25 ml of the steroid/anesthetic solution was then injected into each of the facet joints noted above.   Additional Comments:  The patient tolerated the procedure well Dressing: 2 x 2 sterile gauze and Band-Aid    Post-procedure details: Patient was  observed during the procedure. Post-procedure instructions were reviewed.  Patient left the clinic in stable condition.

## 2019-06-24 NOTE — Progress Notes (Signed)
.  Numeric Pain Rating Scale and Functional Assessment Average Pain 6   In the last MONTH (on 0-10 scale) has pain interfered with the following?  1. General activity like being  able to carry out your everyday physical activities such as walking, climbing stairs, carrying groceries, or moving a chair?  Rating(5)   +Driver, -BT, +Dye Allergies(Ivp dye).

## 2019-07-02 ENCOUNTER — Ambulatory Visit: Payer: Medicare Other | Admitting: Family Medicine

## 2019-07-05 ENCOUNTER — Other Ambulatory Visit: Payer: Self-pay

## 2019-07-05 ENCOUNTER — Ambulatory Visit (INDEPENDENT_AMBULATORY_CARE_PROVIDER_SITE_OTHER): Payer: Medicare Other | Admitting: Family Medicine

## 2019-07-05 ENCOUNTER — Encounter: Payer: Self-pay | Admitting: Family Medicine

## 2019-07-05 VITALS — BP 116/63 | HR 68 | Temp 98.0°F | Resp 17 | Ht 72.0 in | Wt 229.4 lb

## 2019-07-05 DIAGNOSIS — N2889 Other specified disorders of kidney and ureter: Secondary | ICD-10-CM

## 2019-07-05 DIAGNOSIS — E118 Type 2 diabetes mellitus with unspecified complications: Secondary | ICD-10-CM | POA: Diagnosis not present

## 2019-07-05 DIAGNOSIS — E669 Obesity, unspecified: Secondary | ICD-10-CM

## 2019-07-05 DIAGNOSIS — E7800 Pure hypercholesterolemia, unspecified: Secondary | ICD-10-CM

## 2019-07-05 DIAGNOSIS — I10 Essential (primary) hypertension: Secondary | ICD-10-CM

## 2019-07-05 DIAGNOSIS — N183 Chronic kidney disease, stage 3 unspecified: Secondary | ICD-10-CM

## 2019-07-05 LAB — MICROALBUMIN / CREATININE URINE RATIO
Creatinine,U: 129.6 mg/dL
Microalb Creat Ratio: 2.6 mg/g (ref 0.0–30.0)
Microalb, Ur: 3.4 mg/dL — ABNORMAL HIGH (ref 0.0–1.9)

## 2019-07-05 LAB — COMPREHENSIVE METABOLIC PANEL
ALT: 35 U/L (ref 0–53)
AST: 37 U/L (ref 0–37)
Albumin: 4.8 g/dL (ref 3.5–5.2)
Alkaline Phosphatase: 42 U/L (ref 39–117)
BUN: 48 mg/dL — ABNORMAL HIGH (ref 6–23)
CO2: 25 mEq/L (ref 19–32)
Calcium: 10.2 mg/dL (ref 8.4–10.5)
Chloride: 103 mEq/L (ref 96–112)
Creatinine, Ser: 2.04 mg/dL — ABNORMAL HIGH (ref 0.40–1.50)
GFR: 31.58 mL/min — ABNORMAL LOW (ref >60.00)
Glucose, Bld: 72 mg/dL (ref 70–99)
Potassium: 5 mEq/L (ref 3.5–5.1)
Sodium: 137 mEq/L (ref 135–145)
Total Bilirubin: 0.7 mg/dL (ref 0.2–1.2)
Total Protein: 7.5 g/dL (ref 6.0–8.3)
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
## 2019-07-05 NOTE — Patient Instructions (Signed)
Drink 3-4 ounces of tonic water every morning and every evening to help prevent muscle cramps.

## 2019-07-06 ENCOUNTER — Encounter: Payer: Self-pay | Admitting: Family Medicine

## 2019-07-28 ENCOUNTER — Other Ambulatory Visit: Payer: Self-pay | Admitting: Family Medicine

## 2019-10-05 ENCOUNTER — Other Ambulatory Visit: Payer: Self-pay

## 2019-10-06 ENCOUNTER — Encounter: Payer: Self-pay | Admitting: Family Medicine

## 2019-10-06 ENCOUNTER — Ambulatory Visit (INDEPENDENT_AMBULATORY_CARE_PROVIDER_SITE_OTHER): Payer: Medicare Other | Admitting: Family Medicine

## 2019-10-06 VITALS — BP 145/83 | HR 76 | Temp 98.0°F | Resp 16 | Ht 72.0 in | Wt 237.6 lb

## 2019-10-06 DIAGNOSIS — I10 Essential (primary) hypertension: Secondary | ICD-10-CM | POA: Diagnosis not present

## 2019-10-06 DIAGNOSIS — N183 Chronic kidney disease, stage 3 unspecified: Secondary | ICD-10-CM | POA: Diagnosis not present

## 2019-10-06 DIAGNOSIS — E118 Type 2 diabetes mellitus with unspecified complications: Secondary | ICD-10-CM | POA: Diagnosis not present

## 2019-10-06 DIAGNOSIS — M159 Polyosteoarthritis, unspecified: Secondary | ICD-10-CM

## 2019-10-06 DIAGNOSIS — E7800 Pure hypercholesterolemia, unspecified: Secondary | ICD-10-CM | POA: Diagnosis not present

## 2019-10-06 DIAGNOSIS — M8949 Other hypertrophic osteoarthropathy, multiple sites: Secondary | ICD-10-CM

## 2019-10-06 LAB — BASIC METABOLIC PANEL
BUN: 36 mg/dL — ABNORMAL HIGH (ref 6–23)
CO2: 26 mEq/L (ref 19–32)
Calcium: 9.6 mg/dL (ref 8.4–10.5)
Chloride: 103 mEq/L (ref 96–112)
Creatinine, Ser: 1.9 mg/dL — ABNORMAL HIGH (ref 0.40–1.50)
GFR: 34.26 mL/min — ABNORMAL LOW (ref >60.00)
Glucose, Bld: 133 mg/dL — ABNORMAL HIGH (ref 70–99)
Potassium: 5.2 mEq/L — ABNORMAL HIGH (ref 3.5–5.1)
Sodium: 138 mEq/L (ref 135–145)

## 2019-10-06 LAB — LIPID PANEL
Cholesterol: 87 mg/dL (ref 0–200)
HDL: 43.2 mg/dL (ref >39.00)
LDL Cholesterol: 32 mg/dL (ref 0–99)
NonHDL: 44.08
Total CHOL/HDL Ratio: 2
Triglycerides: 61 mg/dL (ref 0.0–149.0)
VLDL: 12.2 mg/dL (ref 0.0–40.0)

## 2019-10-06 LAB — HEMOGLOBIN A1C: Hgb A1c MFr Bld: 6.8 % — ABNORMAL HIGH (ref 4.6–6.5)
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
## 2019-11-08 ENCOUNTER — Ambulatory Visit (INDEPENDENT_AMBULATORY_CARE_PROVIDER_SITE_OTHER): Payer: Medicare Other | Admitting: Physician Assistant

## 2019-11-08 ENCOUNTER — Other Ambulatory Visit: Payer: Self-pay

## 2019-11-08 ENCOUNTER — Ambulatory Visit (INDEPENDENT_AMBULATORY_CARE_PROVIDER_SITE_OTHER): Payer: Medicare Other

## 2019-11-08 ENCOUNTER — Encounter: Payer: Self-pay | Admitting: Physician Assistant

## 2019-11-08 DIAGNOSIS — M25512 Pain in left shoulder: Secondary | ICD-10-CM

## 2019-11-08 MED ORDER — LIDOCAINE HCL 1 % IJ SOLN
3.0000 mL | INTRAMUSCULAR | Status: AC | PRN
  Administered 2019-11-08: 3 mL

## 2019-11-08 MED ORDER — METHYLPREDNISOLONE ACETATE 40 MG/ML IJ SUSP
40.0000 mg | INTRAMUSCULAR | Status: AC | PRN
  Administered 2019-11-08: 40 mg via INTRA_ARTICULAR
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
## 2019-11-19 DIAGNOSIS — M19012 Primary osteoarthritis, left shoulder: Secondary | ICD-10-CM

## 2019-11-19 HISTORY — DX: Primary osteoarthritis, left shoulder: M19.012

## 2019-11-22 ENCOUNTER — Ambulatory Visit: Payer: Medicare Other | Admitting: Physician Assistant

## 2019-11-29 ENCOUNTER — Encounter: Payer: Self-pay | Admitting: Physician Assistant

## 2019-11-29 ENCOUNTER — Ambulatory Visit (INDEPENDENT_AMBULATORY_CARE_PROVIDER_SITE_OTHER): Payer: Medicare Other | Admitting: Physician Assistant

## 2019-11-29 ENCOUNTER — Other Ambulatory Visit: Payer: Self-pay

## 2019-11-29 VITALS — Ht 72.0 in | Wt 232.0 lb

## 2019-11-29 DIAGNOSIS — M25512 Pain in left shoulder: Secondary | ICD-10-CM

## 2019-11-29 NOTE — Addendum Note (Signed)
Addended by: Meyer Cory on: 11/29/2019 11:20 AM   Modules accepted: Orders
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
## 2019-12-04 IMAGING — CR DG ORBITS FOR FOREIGN BODY
2 series · 2 of 2 positions shown · non-contrast
Comparison: None.

CLINICAL DATA: Metal working/exposure; clearance prior to MRI

EXAM:
ORBITS FOR FOREIGN BODY - 2 VIEW

[w orbit pa (1 of 2)]
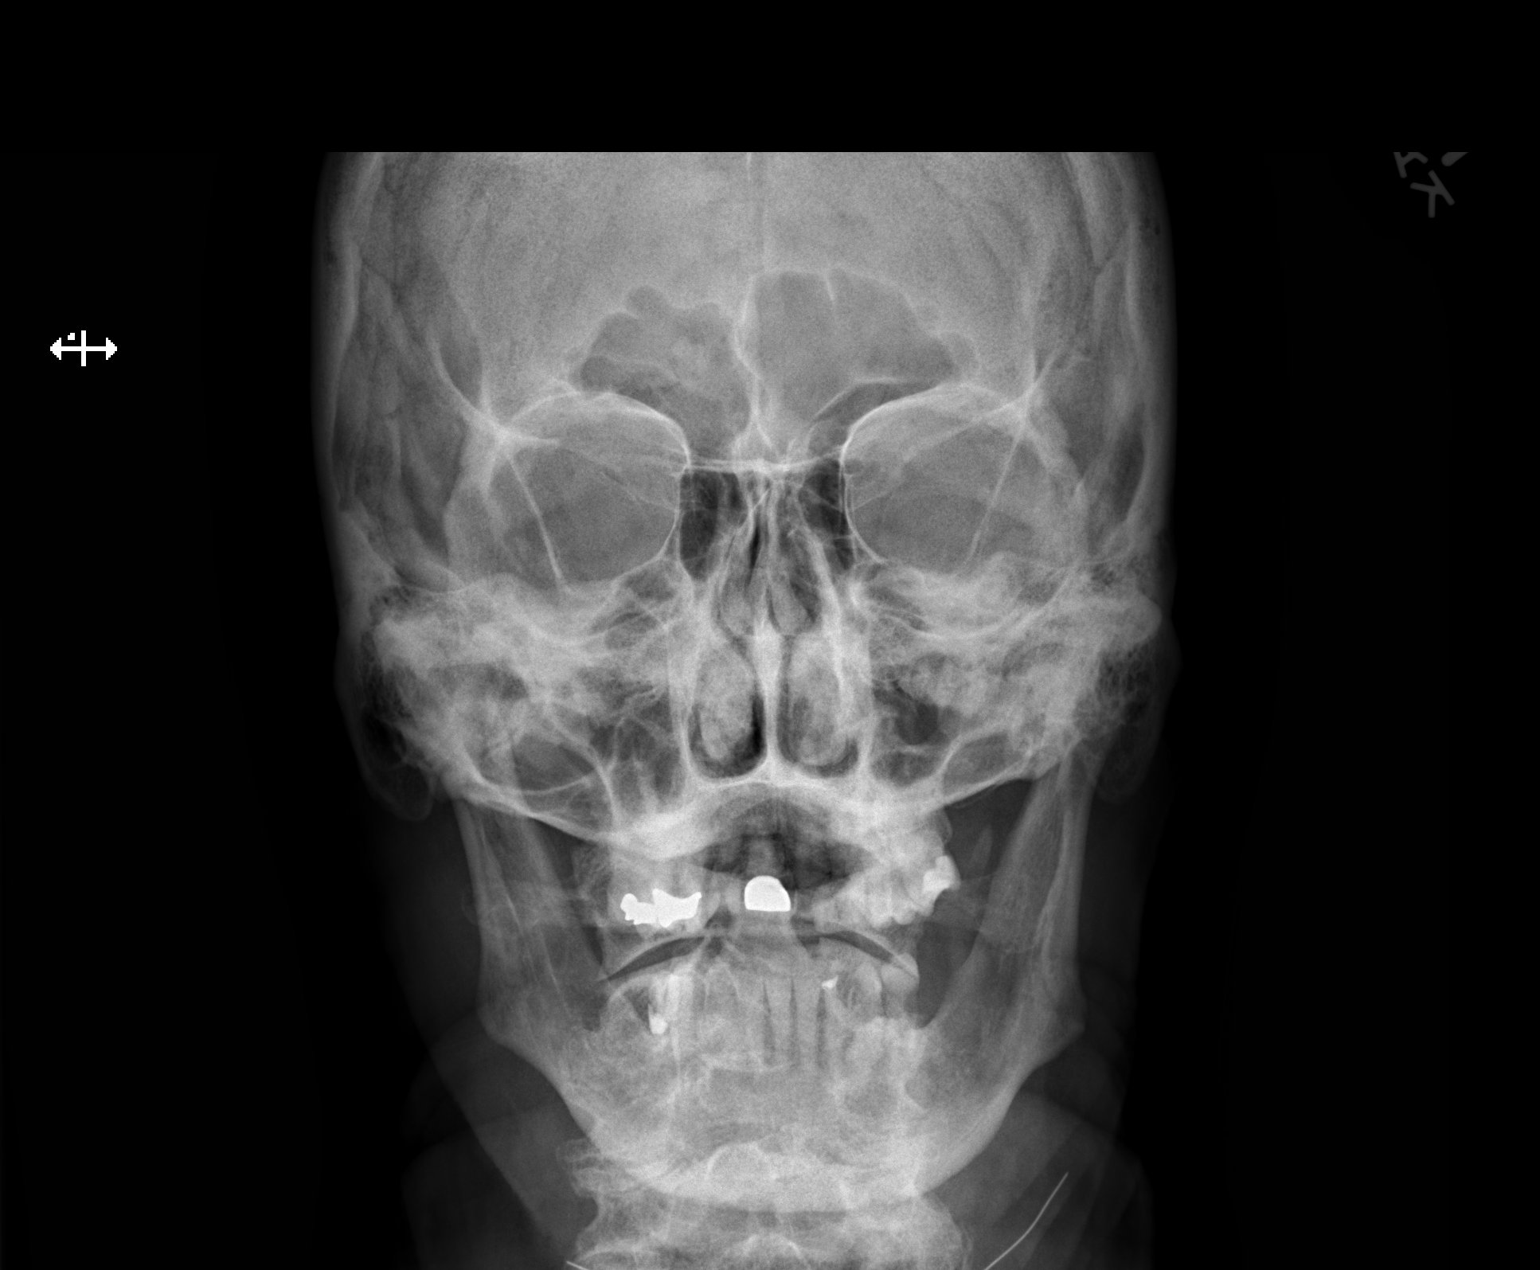

[w orbit pa (2 of 2)]
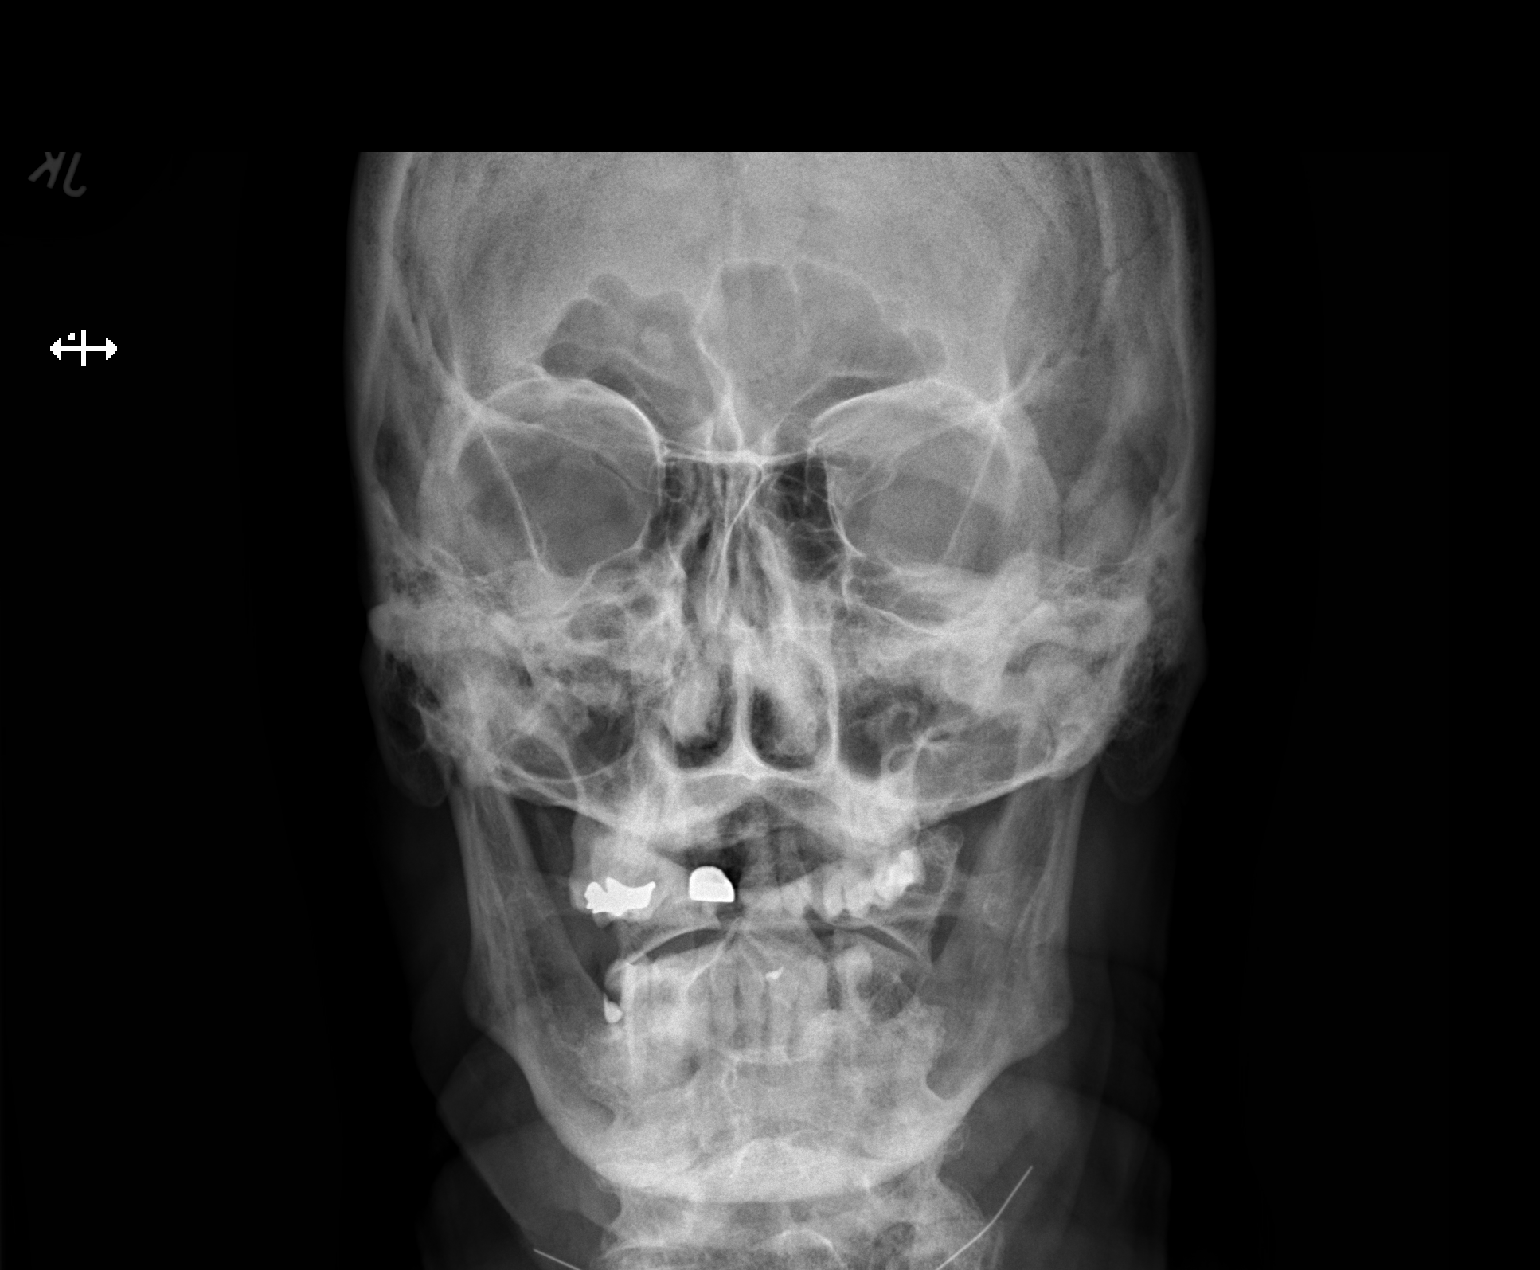

[2 of 2 positions shown; findings below may reference images not displayed]

FINDINGS: There is no evidence of metallic foreign body within the orbits. No
significant bone abnormality identified.
IMPRESSION: No evidence of metallic foreign body within the orbits.

## 2019-12-05 ENCOUNTER — Other Ambulatory Visit: Payer: Self-pay

## 2019-12-05 ENCOUNTER — Ambulatory Visit
Admission: RE | Admit: 2019-12-05 | Discharge: 2019-12-05 | Disposition: A | Discharge status: Home / Self Care | Payer: Medicare Other | Source: Ambulatory Visit | Attending: Physician Assistant | Admitting: Physician Assistant

## 2019-12-05 DIAGNOSIS — M25512 Pain in left shoulder: Secondary | ICD-10-CM

## 2019-12-07 ENCOUNTER — Other Ambulatory Visit: Payer: Self-pay | Admitting: Physician Assistant

## 2019-12-07 ENCOUNTER — Ambulatory Visit: Payer: Medicare Other | Admitting: Physician Assistant

## 2019-12-07 DIAGNOSIS — S0540XA Penetrating wound of orbit with or without foreign body, unspecified eye, initial encounter: Secondary | ICD-10-CM

## 2019-12-13 ENCOUNTER — Encounter: Payer: Self-pay | Admitting: Family Medicine

## 2019-12-14 ENCOUNTER — Other Ambulatory Visit: Payer: Self-pay

## 2019-12-14 ENCOUNTER — Ambulatory Visit
Admission: RE | Admit: 2019-12-14 | Discharge: 2019-12-14 | Disposition: A | Discharge status: Home / Self Care | Payer: Medicare Other | Source: Ambulatory Visit | Attending: Physician Assistant | Admitting: Physician Assistant

## 2019-12-14 DIAGNOSIS — S0540XA Penetrating wound of orbit with or without foreign body, unspecified eye, initial encounter: Secondary | ICD-10-CM

## 2019-12-14 IMAGING — MR MR SHOULDER*L* W/O CM
4 of 5 series · 25 of 40 positions shown · non-contrast
Comparison: Radiographs [DATE]

CLINICAL DATA: Two month history of shoulder pain.

EXAM:
MRI OF THE LEFT SHOULDER WITHOUT CONTRAST
TECHNIQUE: Multiplanar, multisequence MR imaging of the shoulder was performed.
No intravenous contrast was administered.

[Series 8: PD fat-sat · axial · left · 4.0mm · 0.44mm/px · z∈[-58,+31]mm · 8 of 20 slices shown (1 of 2)]
[im 1/20]
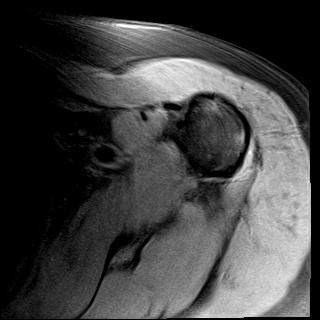
[im 3/20]
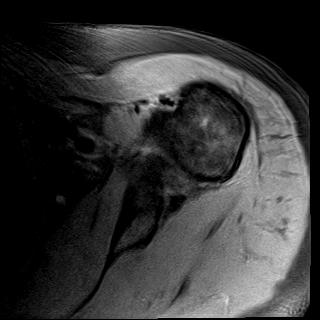
[im 6/20]
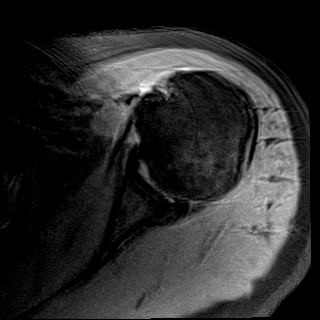
[im 9/20]
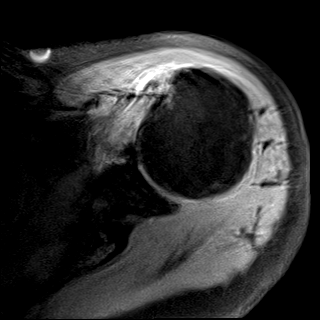
[im 11/20]
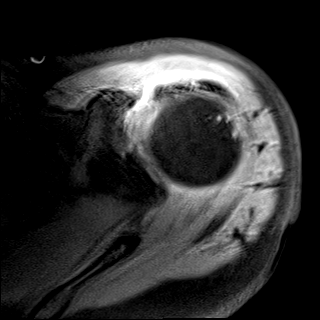
[im 14/20]
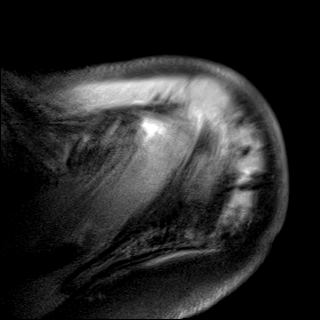
[im 17/20]
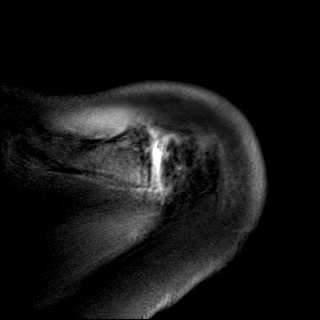
[im 20/20]
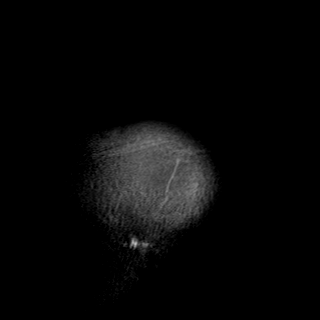

[Series 9: T2 fat-sat · oblique · left · 4.0mm · 0.27mm/px · 6 of 21 slices shown (1 of 2)]
[im 1/21]
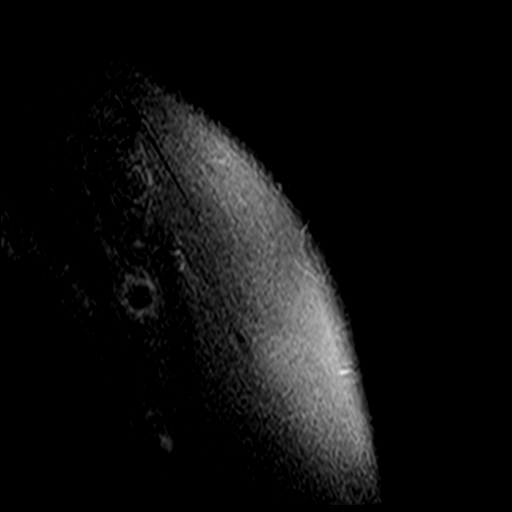
[im 3/21]
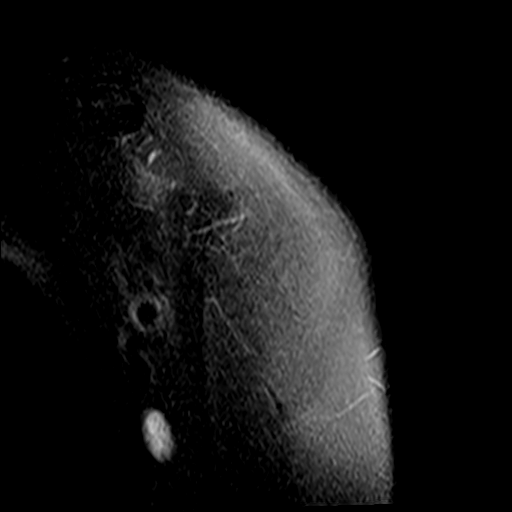
[im 6/21]
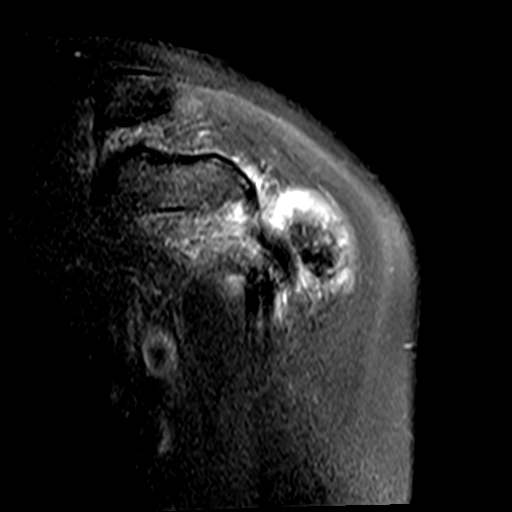
[im 9/21]
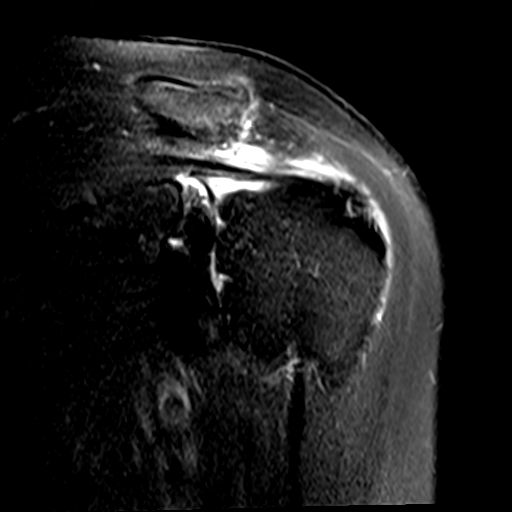
[im 12/21]
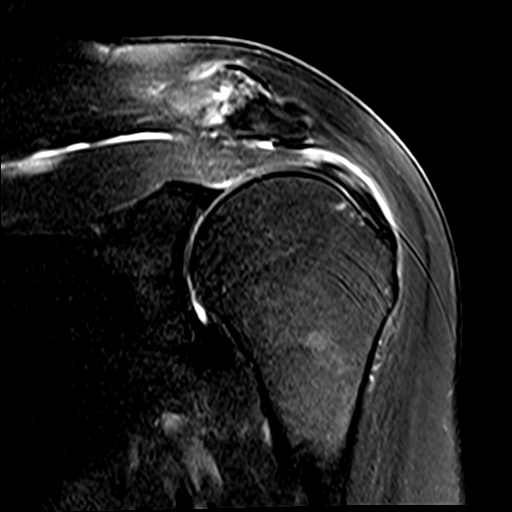
[im 18/21]
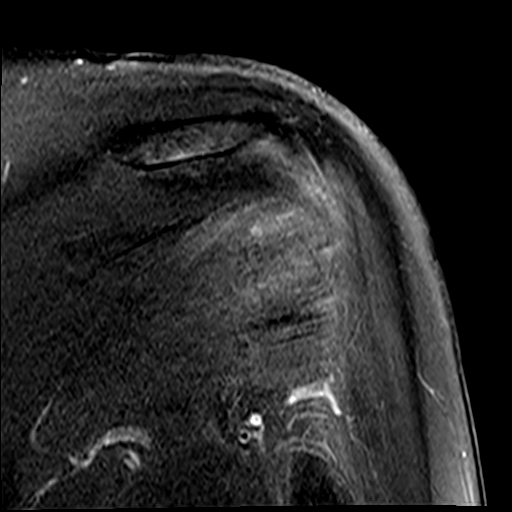

[Series 10: PD fat-sat · oblique · left · 4.0mm · 0.27mm/px · 8 of 21 slices shown (2 of 2)]
[im 1/21]
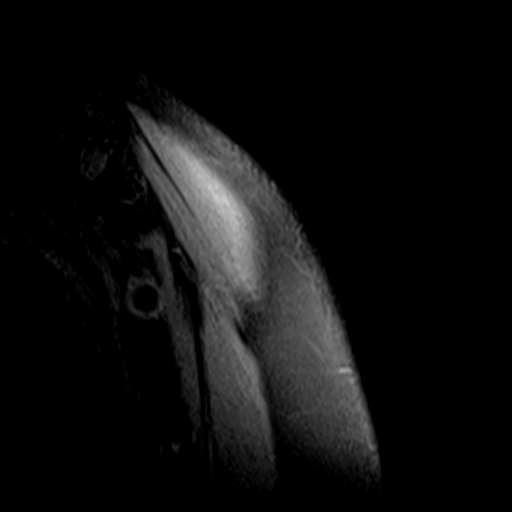
[im 3/21]
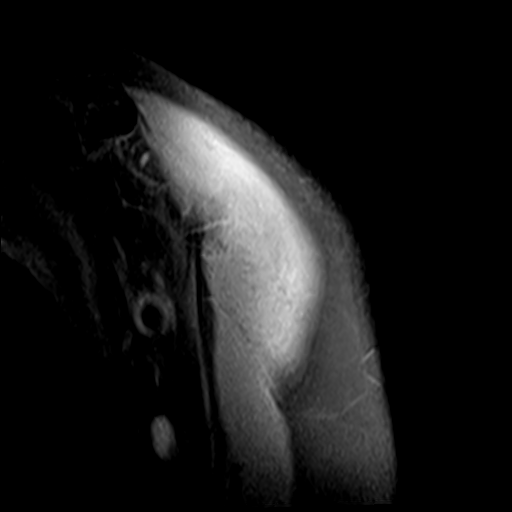
[im 6/21]
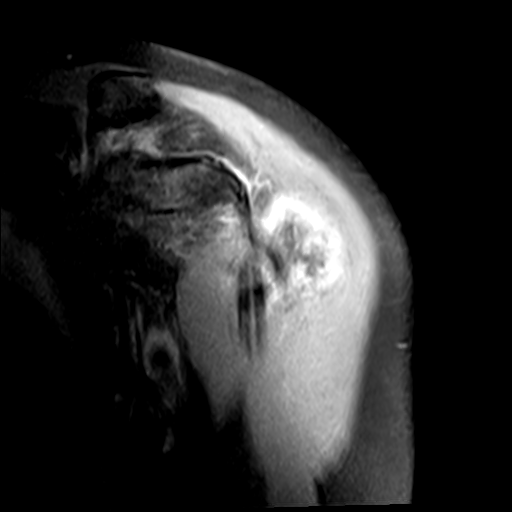
[im 9/21]
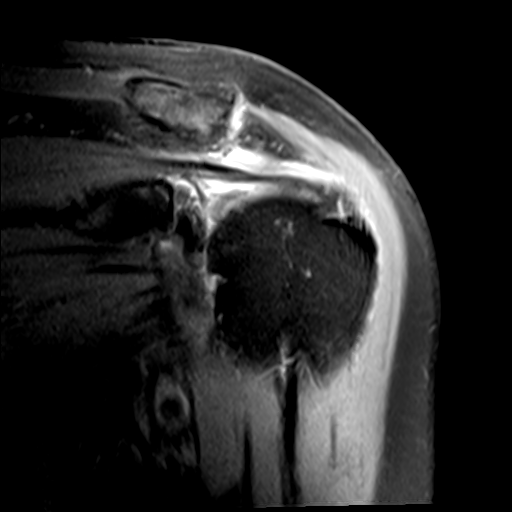
[im 12/21]
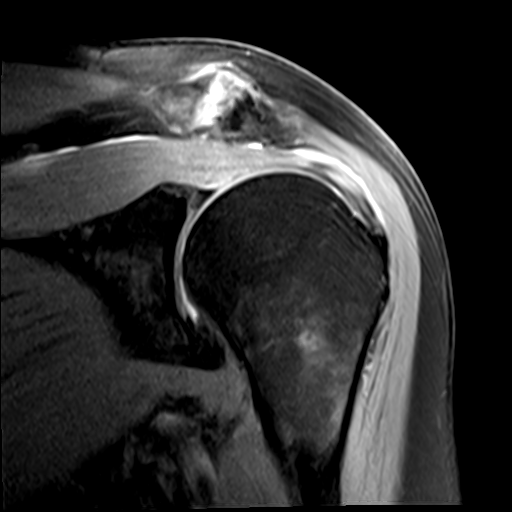
[im 15/21]
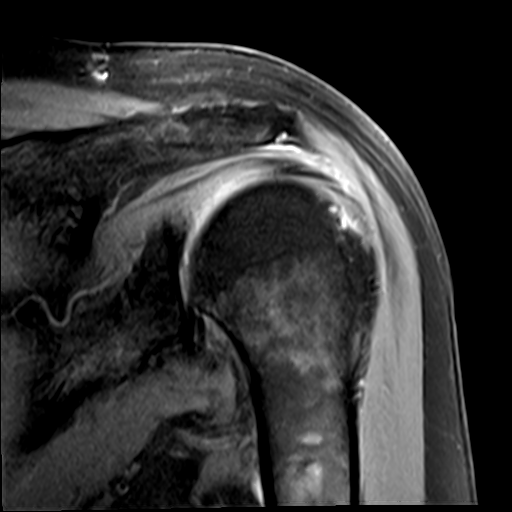
[im 18/21]
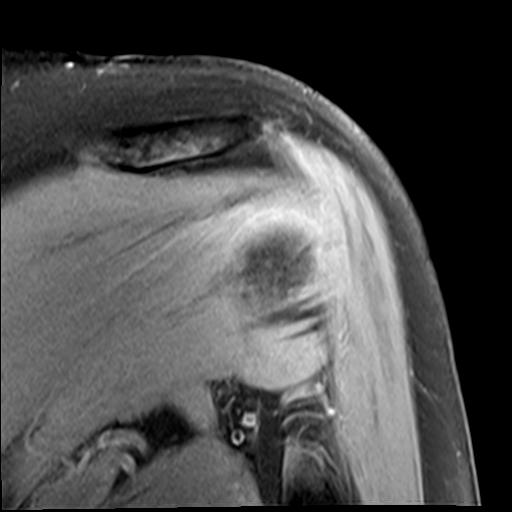
[im 21/21]
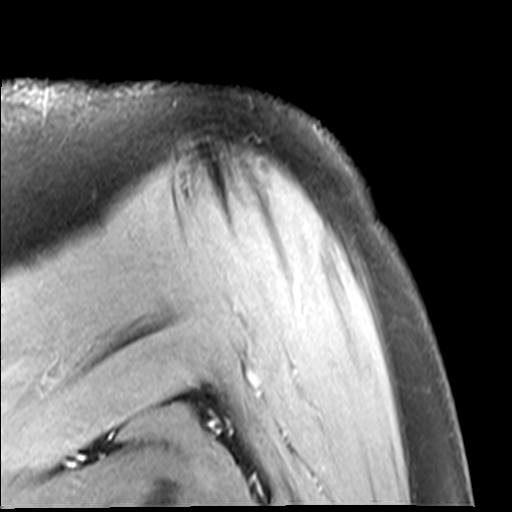

[Series 11: T2 fat-sat · oblique · left · 4.0mm · 0.44mm/px · 3 of 20 slices shown (2 of 2)]
[im 3/20]
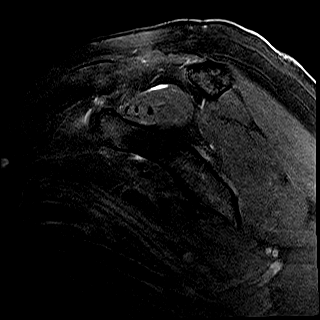
[im 11/20]
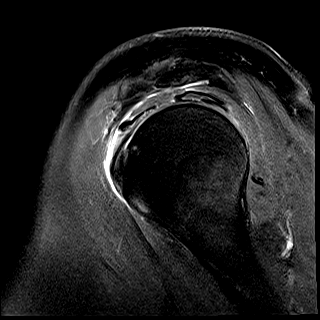
[im 17/20]
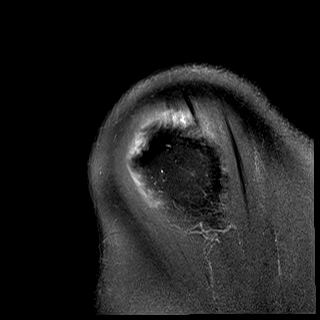

[25 of 40 positions shown; findings below may reference images not displayed]

FINDINGS: Rotator cuff: Moderate rotator cuff tendinopathy/tendinosis with
interstitial tears. There is a shallow bursal surface tear involving
the supraspinatus tendon near the musculotendinous junction. No
full-thickness retracted rotator cuff tear.

Muscles:  No significant findings.

Biceps long head: Intact. Moderate tendinopathy involving the
intra-articular portion.

Acromioclavicular Joint: Mild to moderate degenerative changes. Type
1 acromion. No significant lateral downsloping but there is mild
undersurface spurring change.

Glenohumeral Joint: Moderate degenerative changes with moderate
cartilage loss. There is joint space narrowing, small joint effusion
and moderate synovitis or adhesive capsulitis.

Labrum:  Degenerative labral changes without obvious tear.

Bones:  No acute bony findings.

Other: Moderate subacromial/subdeltoid bursitis.
IMPRESSION: 1. Moderate rotator cuff tendinopathy/tendinosis with interstitial
tears and a shallow bursal surface tear involving the supraspinatus
tendon near the musculotendinous junction. No full-thickness
retracted tear.
2. Intact long head biceps tendon and grossly intact glenoid labrum.
Moderate tendinopathy involving the intra-articular portion of the
long head biceps tendon.
3. Moderate glenohumeral joint degenerative changes with joint space
narrowing, small joint effusion and moderate synovitis or adhesive
capsulitis.
4. Moderate subacromial/subdeltoid bursitis.

## 2019-12-16 ENCOUNTER — Encounter: Payer: Self-pay | Admitting: Physician Assistant

## 2019-12-16 ENCOUNTER — Ambulatory Visit (INDEPENDENT_AMBULATORY_CARE_PROVIDER_SITE_OTHER): Payer: Medicare Other | Admitting: Physician Assistant

## 2019-12-16 ENCOUNTER — Other Ambulatory Visit: Payer: Self-pay

## 2019-12-16 DIAGNOSIS — M19012 Primary osteoarthritis, left shoulder: Secondary | ICD-10-CM

## 2019-12-16 NOTE — Progress Notes (Signed)
HPI: [name] returns today to go over the MRI of his left shoulder.  He continues to have pain of the left shoulder especially with overhead activity.  Again he had a subacromial injection on 11/08/2019 that gave him relief for about a week and then his pain gradually came back. MRI findings reviewed with the patient.  MRI left shoulder dated 12/15/2019 shows moderate glenohumeral joint degenerative changes with joint space narrowing.  Also moderate synovitis of the glenohumeral joint versus adhesive capsulitis.  Interstitial tears are seen in the rotator cuff along with tendinopathy but no full-thickness tears identified.  Biceps long head moderate tendinopathy with intact.  Physical exam: Left shoulder.  Active to the proximately 160- 165 degrees passively and bring 170 degrees.  Positive impingement test on the left  Impression: Left shoulder moderate osteoarthritis  Plan: Given patient's continued pain in the shoulder would recommend an intra-articular injection under ultrasound or fluoroscopy.  Like to have him follow-up with Dr. Ninfa Linden 2 weeks after the injection to see how he is doing.  Questions encouraged and answered.

## 2019-12-17 ENCOUNTER — Telehealth: Payer: Self-pay | Admitting: Radiology

## 2019-12-17 ENCOUNTER — Telehealth: Payer: Self-pay | Admitting: Family Medicine

## 2019-12-17 ENCOUNTER — Other Ambulatory Visit: Payer: Self-pay | Admitting: Radiology

## 2019-12-17 NOTE — Telephone Encounter (Signed)
Called patient left message to return call to schedule a left shoulder injection with Dr. Junius Roads

## 2019-12-17 NOTE — Telephone Encounter (Signed)
Please schedule for Left Shoulder intraarticular injection with Dr. Junius Roads.  Do I need to place referral to Dr. Junius Roads?

## 2019-12-17 NOTE — Telephone Encounter (Signed)
Can you do me a favor and call patient and schedule him an appointment with [facility] whenever is best for him for a shoulder injection Thank you!

## 2019-12-21 ENCOUNTER — Ambulatory Visit (INDEPENDENT_AMBULATORY_CARE_PROVIDER_SITE_OTHER): Payer: Medicare Other | Admitting: Family Medicine

## 2019-12-21 ENCOUNTER — Ambulatory Visit: Payer: Self-pay

## 2019-12-21 ENCOUNTER — Other Ambulatory Visit: Payer: Self-pay

## 2019-12-21 ENCOUNTER — Encounter: Payer: Self-pay | Admitting: Family Medicine

## 2019-12-21 DIAGNOSIS — M19012 Primary osteoarthritis, left shoulder: Secondary | ICD-10-CM | POA: Diagnosis not present

## 2019-12-21 NOTE — Progress Notes (Signed)
Subjective: Patient is here for ultrasound-guided intra-articular left glenohumeral injection.  Has GH DJD with pain reaching overhead.  Objective:  Decreased overhead ROM.  Procedure: Ultrasound-guided left glenohumeral injection: After sterile prep with Betadine, injected 8 cc 1% lidocaine without epinephrine and 40 mg methylprednisolone using a 22-gauge spinal needle, passing the needle through approach into the glenohumeral joint.  Injectate seen filling joint capsule.  Not a lot of immediate improvement.

## 2019-12-29 LAB — HM DIABETES EYE EXAM

## 2019-12-31 ENCOUNTER — Encounter: Payer: Self-pay | Admitting: Family Medicine

## 2020-01-03 ENCOUNTER — Ambulatory Visit (INDEPENDENT_AMBULATORY_CARE_PROVIDER_SITE_OTHER): Payer: Medicare Other | Admitting: Physician Assistant

## 2020-01-03 ENCOUNTER — Encounter: Payer: Self-pay | Admitting: Physician Assistant

## 2020-01-03 ENCOUNTER — Other Ambulatory Visit: Payer: Self-pay

## 2020-01-03 DIAGNOSIS — M19012 Primary osteoarthritis, left shoulder: Secondary | ICD-10-CM

## 2020-01-03 NOTE — Progress Notes (Signed)
HPI: Mr. Bley returns today follow-up status post intra-articular injection left shoulder by Dr. Junius Roads.  He states his range of motion is much better.  He states that he can bring his left arm above his head as long as he does not supinate .  He is happy with the results of the has achieved.  He understands to continue to work on range of motion of the shoulder.  He is not interested in any type of shoulder replacement.  Physical exam: Left shoulder he has decreased external rotation virtually none.  Forward flexion he can bring the arm to 180 degrees.  Impression: Left shoulder moderate osteoarthritis  Plan: He will continue to work on range of motion strengthening the shoulder.  Understands to wait at least 3 months between injections.  Questions encouraged and answered at length.

## 2020-01-07 ENCOUNTER — Ambulatory Visit: Payer: Medicare Other | Admitting: Family Medicine

## 2020-01-11 ENCOUNTER — Encounter: Payer: Self-pay | Admitting: Family Medicine

## 2020-01-31 LAB — BASIC METABOLIC PANEL
BUN: 41 — AB (ref 4–21)
CO2: 25 — AB (ref 13–22)
Chloride: 105 (ref 99–108)
Creatinine: 2.1 — AB (ref 0.6–1.3)
Glucose: 257
Potassium: 5.4 — AB (ref 3.4–5.3)
Sodium: 139 (ref 137–147)

## 2020-01-31 LAB — COMPREHENSIVE METABOLIC PANEL
Albumin: 4.2 (ref 3.5–5.0)
Calcium: 9.3 (ref 8.7–10.7)
GFR calc Af Amer: 34
GFR calc non Af Amer: 29

## 2020-01-31 LAB — CBC AND DIFFERENTIAL: Hemoglobin: 12.8 — AB (ref 13.5–17.5)

## 2020-02-03 ENCOUNTER — Other Ambulatory Visit: Payer: Self-pay

## 2020-02-04 ENCOUNTER — Ambulatory Visit (INDEPENDENT_AMBULATORY_CARE_PROVIDER_SITE_OTHER): Payer: Medicare Other | Admitting: Family Medicine

## 2020-02-04 ENCOUNTER — Encounter: Payer: Self-pay | Admitting: Family Medicine

## 2020-02-04 VITALS — BP 138/82 | HR 80 | Temp 98.2°F | Resp 16 | Ht 72.0 in | Wt 237.2 lb

## 2020-02-04 DIAGNOSIS — E782 Mixed hyperlipidemia: Secondary | ICD-10-CM

## 2020-02-04 DIAGNOSIS — E118 Type 2 diabetes mellitus with unspecified complications: Secondary | ICD-10-CM

## 2020-02-04 DIAGNOSIS — N183 Chronic kidney disease, stage 3 unspecified: Secondary | ICD-10-CM

## 2020-02-04 DIAGNOSIS — E875 Hyperkalemia: Secondary | ICD-10-CM

## 2020-02-04 DIAGNOSIS — I10 Essential (primary) hypertension: Secondary | ICD-10-CM | POA: Diagnosis not present

## 2020-02-04 LAB — POCT GLYCOSYLATED HEMOGLOBIN (HGB A1C)
HbA1c POC (<> result, manual entry): 6.6 % (ref 4.0–5.6)
HbA1c, POC (controlled diabetic range): 6.6 % (ref 0.0–7.0)
HbA1c, POC (prediabetic range): 6.6 % — AB (ref 5.7–6.4)
Hemoglobin A1C: 6.6 % — AB (ref 4.0–5.6)
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
## 2020-02-08 ENCOUNTER — Other Ambulatory Visit: Payer: Self-pay | Admitting: Family Medicine

## 2020-06-05 ENCOUNTER — Encounter: Payer: Self-pay | Admitting: Family Medicine

## 2020-06-05 ENCOUNTER — Ambulatory Visit (INDEPENDENT_AMBULATORY_CARE_PROVIDER_SITE_OTHER): Payer: Medicare Other | Admitting: Family Medicine

## 2020-06-05 ENCOUNTER — Other Ambulatory Visit: Payer: Self-pay

## 2020-06-05 VITALS — BP 96/62 | HR 93 | Temp 97.7°F | Resp 16 | Ht 72.0 in | Wt 229.2 lb

## 2020-06-05 DIAGNOSIS — I10 Essential (primary) hypertension: Secondary | ICD-10-CM

## 2020-06-05 DIAGNOSIS — E119 Type 2 diabetes mellitus without complications: Secondary | ICD-10-CM | POA: Diagnosis not present

## 2020-06-05 DIAGNOSIS — N184 Chronic kidney disease, stage 4 (severe): Secondary | ICD-10-CM

## 2020-06-05 DIAGNOSIS — R5382 Chronic fatigue, unspecified: Secondary | ICD-10-CM | POA: Diagnosis not present

## 2020-06-05 DIAGNOSIS — I952 Hypotension due to drugs: Secondary | ICD-10-CM

## 2020-06-05 LAB — COMPREHENSIVE METABOLIC PANEL
ALT: 31 U/L (ref 0–53)
AST: 35 U/L (ref 0–37)
Albumin: 4.3 g/dL (ref 3.5–5.2)
Alkaline Phosphatase: 40 U/L (ref 39–117)
BUN: 39 mg/dL — ABNORMAL HIGH (ref 6–23)
CO2: 26 mEq/L (ref 19–32)
Calcium: 9.9 mg/dL (ref 8.4–10.5)
Chloride: 103 mEq/L (ref 96–112)
Creatinine, Ser: 2.06 mg/dL — ABNORMAL HIGH (ref 0.40–1.50)
GFR: 31.15 mL/min — ABNORMAL LOW (ref >60.00)
Glucose, Bld: 143 mg/dL — ABNORMAL HIGH (ref 70–99)
Potassium: 5 mEq/L (ref 3.5–5.1)
Sodium: 137 mEq/L (ref 135–145)
Total Bilirubin: 0.6 mg/dL (ref 0.2–1.2)
Total Protein: 6.7 g/dL (ref 6.0–8.3)

## 2020-06-05 LAB — CBC WITH DIFFERENTIAL/PLATELET
Basophils Absolute: 0 10*3/uL (ref 0.0–0.1)
Basophils Relative: 0.7 % (ref 0.0–3.0)
Eosinophils Absolute: 0.4 10*3/uL (ref 0.0–0.7)
Eosinophils Relative: 6.5 % — ABNORMAL HIGH (ref 0.0–5.0)
HCT: 41.5 % (ref 39.0–52.0)
Hemoglobin: 13.8 g/dL (ref 13.0–17.0)
Lymphocytes Relative: 22.2 % (ref 12.0–46.0)
Lymphs Abs: 1.4 10*3/uL (ref 0.7–4.0)
MCHC: 33.2 g/dL (ref 30.0–36.0)
MCV: 97.3 fl (ref 78.0–100.0)
Monocytes Absolute: 0.8 10*3/uL (ref 0.1–1.0)
Monocytes Relative: 12.8 % — ABNORMAL HIGH (ref 3.0–12.0)
Neutro Abs: 3.7 10*3/uL (ref 1.4–7.7)
Neutrophils Relative %: 57.8 % (ref 43.0–77.0)
Platelets: 192 10*3/uL (ref 150.0–400.0)
RBC: 4.26 Mil/uL (ref 4.22–5.81)
RDW: 15.1 % (ref 11.5–15.5)
WBC: 6.3 10*3/uL (ref 4.0–10.5)

## 2020-06-05 LAB — HEMOGLOBIN A1C: Hgb A1c MFr Bld: 6.7 % — ABNORMAL HIGH (ref 4.6–6.5)

## 2020-06-05 NOTE — Patient Instructions (Signed)
Stop amlodipine. Check your blood pressure and heart rate every morning and every evening for the next 2 weeks and bring them with you for review with me in 2 wks.
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
## 2020-06-06 ENCOUNTER — Encounter: Payer: Self-pay | Admitting: Family Medicine

## 2020-06-09 ENCOUNTER — Encounter: Payer: Self-pay | Admitting: Family Medicine

## 2020-06-19 ENCOUNTER — Encounter: Payer: Self-pay | Admitting: Family Medicine

## 2020-06-19 ENCOUNTER — Other Ambulatory Visit: Payer: Self-pay

## 2020-06-19 ENCOUNTER — Ambulatory Visit (INDEPENDENT_AMBULATORY_CARE_PROVIDER_SITE_OTHER): Payer: Medicare Other | Admitting: Family Medicine

## 2020-06-19 VITALS — BP 127/69 | HR 75 | Temp 98.7°F | Resp 16 | Ht 72.0 in | Wt 231.8 lb

## 2020-06-19 DIAGNOSIS — I952 Hypotension due to drugs: Secondary | ICD-10-CM | POA: Diagnosis not present
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
## 2020-08-11 ENCOUNTER — Other Ambulatory Visit: Payer: Self-pay | Admitting: Family Medicine

## 2020-09-19 ENCOUNTER — Other Ambulatory Visit: Payer: Self-pay

## 2020-09-19 ENCOUNTER — Ambulatory Visit (INDEPENDENT_AMBULATORY_CARE_PROVIDER_SITE_OTHER): Payer: Medicare Other | Admitting: Family Medicine

## 2020-09-19 ENCOUNTER — Encounter: Payer: Self-pay | Admitting: Family Medicine

## 2020-09-19 VITALS — BP 125/73 | HR 84 | Temp 97.8°F | Resp 16 | Ht 72.0 in | Wt 228.8 lb

## 2020-09-19 DIAGNOSIS — R5382 Chronic fatigue, unspecified: Secondary | ICD-10-CM

## 2020-09-19 DIAGNOSIS — N1832 Chronic kidney disease, stage 3b: Secondary | ICD-10-CM

## 2020-09-19 DIAGNOSIS — K144 Atrophy of tongue papillae: Secondary | ICD-10-CM | POA: Diagnosis not present

## 2020-09-19 DIAGNOSIS — E538 Deficiency of other specified B group vitamins: Secondary | ICD-10-CM | POA: Diagnosis not present

## 2020-09-19 DIAGNOSIS — E118 Type 2 diabetes mellitus with unspecified complications: Secondary | ICD-10-CM

## 2020-09-19 DIAGNOSIS — E782 Mixed hyperlipidemia: Secondary | ICD-10-CM

## 2020-09-19 LAB — BASIC METABOLIC PANEL
BUN: 34 mg/dL — ABNORMAL HIGH (ref 6–23)
CO2: 28 mEq/L (ref 19–32)
Calcium: 9.7 mg/dL (ref 8.4–10.5)
Chloride: 102 mEq/L (ref 96–112)
Creatinine, Ser: 1.94 mg/dL — ABNORMAL HIGH (ref 0.40–1.50)
GFR: 31.94 mL/min — ABNORMAL LOW (ref >60.00)
Glucose, Bld: 120 mg/dL — ABNORMAL HIGH (ref 70–99)
Potassium: 5 mEq/L (ref 3.5–5.1)
Sodium: 137 mEq/L (ref 135–145)

## 2020-09-19 LAB — LIPID PANEL
Cholesterol: 86 mg/dL (ref 0–200)
HDL: 45.4 mg/dL (ref >39.00)
LDL Cholesterol: 28 mg/dL (ref 0–99)
NonHDL: 40.72
Total CHOL/HDL Ratio: 2
Triglycerides: 65 mg/dL (ref 0.0–149.0)
VLDL: 13 mg/dL (ref 0.0–40.0)

## 2020-09-19 LAB — MICROALBUMIN / CREATININE URINE RATIO
Creatinine,U: 140.5 mg/dL
Microalb Creat Ratio: 1.5 mg/g (ref 0.0–30.0)
Microalb, Ur: 2.1 mg/dL — ABNORMAL HIGH (ref 0.0–1.9)

## 2020-09-19 LAB — HEMOGLOBIN A1C: Hgb A1c MFr Bld: 6.8 % — ABNORMAL HIGH (ref 4.6–6.5)

## 2020-09-19 LAB — VITAMIN B12: Vitamin B-12: 440 pg/mL (ref 211–911)
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
## 2020-09-19 NOTE — Addendum Note (Signed)
Addended by: Tammi Sou on: 09/19/2020 09:00 AM   Modules accepted: Orders

## 2020-11-18 DIAGNOSIS — F0781 Postconcussional syndrome: Secondary | ICD-10-CM

## 2020-11-18 HISTORY — DX: Postconcussional syndrome: F07.81

## 2020-11-29 ENCOUNTER — Telehealth: Payer: Self-pay

## 2020-11-29 NOTE — Telephone Encounter (Signed)
OK to offer 4 oclock in-person appt this Friday (14th)

## 2020-11-29 NOTE — Telephone Encounter (Signed)
Please advise on message below.

## 2020-11-29 NOTE — Telephone Encounter (Signed)
Patient states that he has been sick going on for almost 2 months now.  He was treated at [facility] for sinus congestion/headache/drainage in December.  He stated they did not test him for COVID and/or flu. They treated him for a sinus infection (Zpac). Patient reports his right eye feels as if it is swollen. It hurts.  Feels it in his teeth.  He said "I get this every year."  Dr. Anitra Lauth could not see "me in December and he cant see me now..again."   I am not able to offer him an appt, because Dr. Anitra Lauth does not have any appts available. Patient does not want a virtual. Please advise.  Patient can be reached at [phone].  Thank you

## 2020-12-11 ENCOUNTER — Encounter: Payer: Self-pay | Admitting: Family Medicine

## 2020-12-11 ENCOUNTER — Ambulatory Visit (INDEPENDENT_AMBULATORY_CARE_PROVIDER_SITE_OTHER): Payer: Medicare Other | Admitting: Family Medicine

## 2020-12-11 ENCOUNTER — Other Ambulatory Visit: Payer: Self-pay

## 2020-12-11 VITALS — BP 140/80 | HR 91 | Temp 97.8°F | Resp 16 | Ht 72.0 in | Wt 229.2 lb

## 2020-12-11 DIAGNOSIS — R519 Headache, unspecified: Secondary | ICD-10-CM | POA: Diagnosis not present

## 2020-12-11 DIAGNOSIS — N183 Chronic kidney disease, stage 3 unspecified: Secondary | ICD-10-CM

## 2020-12-11 MED ORDER — TOPIRAMATE 25 MG PO TABS: 25.0000 mg | ORAL_TABLET | Freq: Two times a day (BID) | ORAL | 1 refills | Status: DC
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
## 2020-12-12 ENCOUNTER — Encounter: Payer: Self-pay | Admitting: Neurology

## 2020-12-12 ENCOUNTER — Other Ambulatory Visit: Payer: Self-pay | Admitting: Family Medicine

## 2020-12-12 LAB — CBC WITH DIFFERENTIAL/PLATELET
Basophils Absolute: 0.1 10*3/uL (ref 0.0–0.1)
Basophils Relative: 1.4 % (ref 0.0–3.0)
Eosinophils Absolute: 0.4 10*3/uL (ref 0.0–0.7)
Eosinophils Relative: 4.1 % (ref 0.0–5.0)
HCT: 42.4 % (ref 39.0–52.0)
Hemoglobin: 14 g/dL (ref 13.0–17.0)
Lymphocytes Relative: 19.3 % (ref 12.0–46.0)
Lymphs Abs: 1.7 10*3/uL (ref 0.7–4.0)
MCHC: 32.9 g/dL (ref 30.0–36.0)
MCV: 96.9 fl (ref 78.0–100.0)
Monocytes Absolute: 1.1 10*3/uL — ABNORMAL HIGH (ref 0.1–1.0)
Monocytes Relative: 12.5 % — ABNORMAL HIGH (ref 3.0–12.0)
Neutro Abs: 5.5 10*3/uL (ref 1.4–7.7)
Neutrophils Relative %: 62.7 % (ref 43.0–77.0)
Platelets: 235 10*3/uL (ref 150.0–400.0)
RBC: 4.37 Mil/uL (ref 4.22–5.81)
RDW: 15.4 % (ref 11.5–15.5)
WBC: 8.8 10*3/uL (ref 4.0–10.5)

## 2020-12-12 LAB — BASIC METABOLIC PANEL
BUN: 39 mg/dL — ABNORMAL HIGH (ref 6–23)
CO2: 28 mEq/L (ref 19–32)
Calcium: 10.3 mg/dL (ref 8.4–10.5)
Chloride: 102 mEq/L (ref 96–112)
Creatinine, Ser: 2.09 mg/dL — ABNORMAL HIGH (ref 0.40–1.50)
GFR: 29.16 mL/min — ABNORMAL LOW (ref >60.00)
Glucose, Bld: 159 mg/dL — ABNORMAL HIGH (ref 70–99)
Potassium: 5.2 mEq/L — ABNORMAL HIGH (ref 3.5–5.1)
Sodium: 137 mEq/L (ref 135–145)

## 2020-12-13 ENCOUNTER — Telehealth: Payer: Self-pay

## 2020-12-13 NOTE — Telephone Encounter (Signed)
Returning call from clinical team.  Everyone was with patients at the time and was not able to speak to him. I told him someone would give him a call back.  He understood.

## 2020-12-13 NOTE — Telephone Encounter (Signed)
MyChart message read.

## 2020-12-28 LAB — HM DIABETES EYE EXAM

## 2020-12-30 LAB — HM DIABETES EYE EXAM
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
## 2021-01-04 ENCOUNTER — Encounter: Payer: Self-pay | Admitting: Family Medicine

## 2021-01-04 ENCOUNTER — Other Ambulatory Visit: Payer: Self-pay

## 2021-01-04 ENCOUNTER — Ambulatory Visit (INDEPENDENT_AMBULATORY_CARE_PROVIDER_SITE_OTHER): Payer: Medicare Other | Admitting: Family Medicine

## 2021-01-04 VITALS — BP 126/73 | HR 88 | Temp 97.5°F | Resp 16 | Ht 72.0 in | Wt 229.6 lb

## 2021-01-04 DIAGNOSIS — E7800 Pure hypercholesterolemia, unspecified: Secondary | ICD-10-CM | POA: Diagnosis not present

## 2021-01-04 DIAGNOSIS — E1121 Type 2 diabetes mellitus with diabetic nephropathy: Secondary | ICD-10-CM | POA: Diagnosis not present

## 2021-01-04 DIAGNOSIS — Z794 Long term (current) use of insulin: Secondary | ICD-10-CM

## 2021-01-04 DIAGNOSIS — G4452 New daily persistent headache (NDPH): Secondary | ICD-10-CM

## 2021-01-04 DIAGNOSIS — I10 Essential (primary) hypertension: Secondary | ICD-10-CM

## 2021-01-04 DIAGNOSIS — N183 Chronic kidney disease, stage 3 unspecified: Secondary | ICD-10-CM

## 2021-01-04 DIAGNOSIS — R519 Headache, unspecified: Secondary | ICD-10-CM

## 2021-01-04 DIAGNOSIS — N184 Chronic kidney disease, stage 4 (severe): Secondary | ICD-10-CM

## 2021-01-04 LAB — POCT GLYCOSYLATED HEMOGLOBIN (HGB A1C)
HbA1c POC (<> result, manual entry): 6.5 % (ref 4.0–5.6)
HbA1c, POC (controlled diabetic range): 6.5 % (ref 0.0–7.0)
HbA1c, POC (prediabetic range): 6.5 % — AB (ref 5.7–6.4)
Hemoglobin A1C: 6.5 % — AB (ref 4.0–5.6)

## 2021-01-04 MED ORDER — TOPIRAMATE 50 MG PO TABS: 50.0000 mg | ORAL_TABLET | Freq: Two times a day (BID) | ORAL | 1 refills | Status: DC

## 2021-01-06 ENCOUNTER — Ambulatory Visit (HOSPITAL_BASED_OUTPATIENT_CLINIC_OR_DEPARTMENT_OTHER)
Admission: RE | Admit: 2021-01-06 | Discharge: 2021-01-06 | Disposition: A | Discharge status: Home / Self Care | Payer: Medicare Other | Source: Ambulatory Visit | Attending: Family Medicine | Admitting: Family Medicine

## 2021-01-06 DIAGNOSIS — G4452 New daily persistent headache (NDPH): Secondary | ICD-10-CM | POA: Insufficient documentation

## 2021-01-06 IMAGING — MR MR HEAD W/O CM
7 of 8 series · 40 of 48 positions shown · non-contrast
Comparison: None.

CLINICAL DATA: Chronic headache over the last 2 months.

EXAM:
MRI HEAD WITHOUT CONTRAST
TECHNIQUE: Multiplanar, multiecho pulse sequences of the brain and surrounding
structures were obtained without intravenous contrast.

[Series 2: T1 · sagittal · 5.0mm · 0.45mm/px · 4 of 23 slices shown]
[im 1/23]
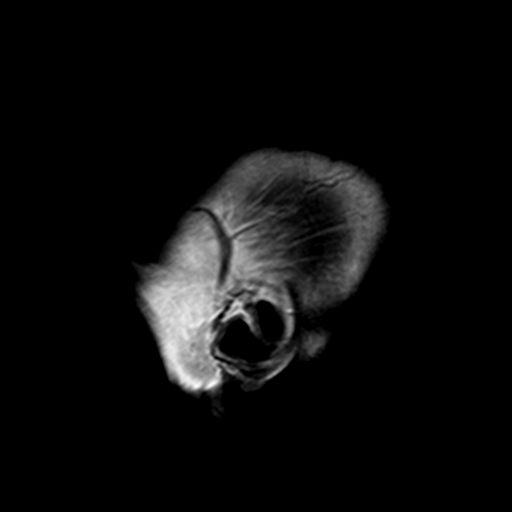
[im 8/23]
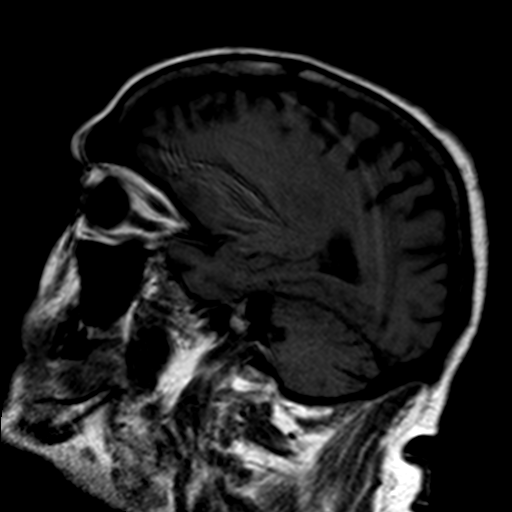
[im 15/23]
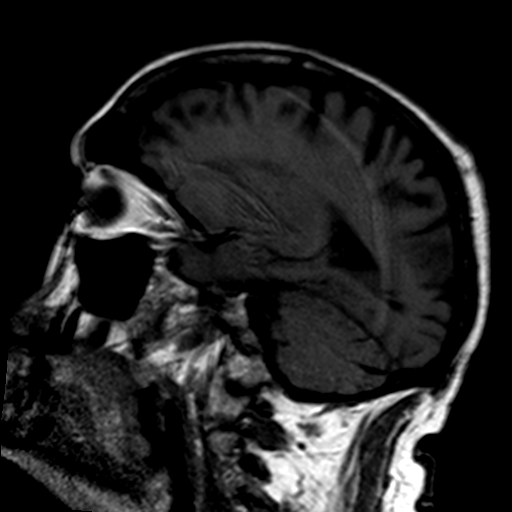
[im 23/23]
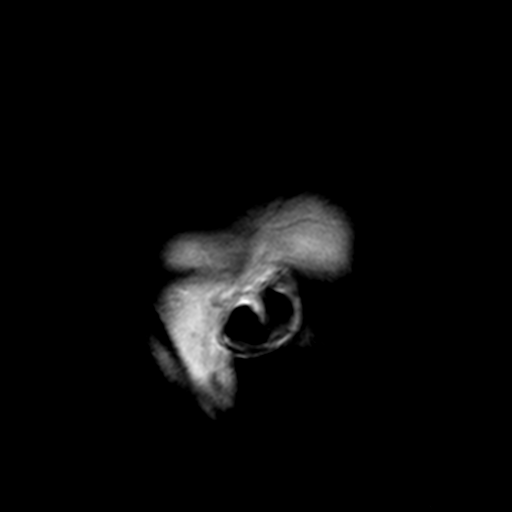

[Series 3: DWI · axial · 3.0mm · 1.86mm/px · z∈[-63,+91]mm · 15 of 103 slices shown (1 of 2)]
[im 1/103]
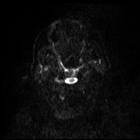
[im 8/103]
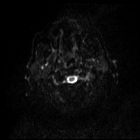
[im 15/103]
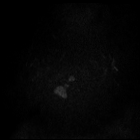
[im 22/103]
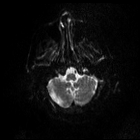
[im 30/103]
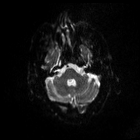
[im 37/103]
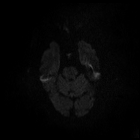
[im 44/103]
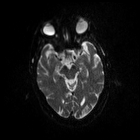
[im 52/103]
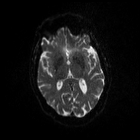
[im 59/103]
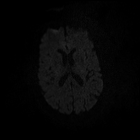
[im 66/103]
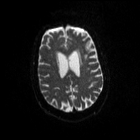
[im 73/103]
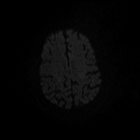
[im 81/103]
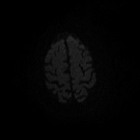
[im 88/103]
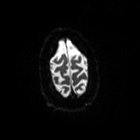
[im 95/103]
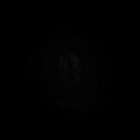
[im 103/103]
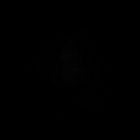

[Series 4: DWI · axial · 3.0mm · 1.86mm/px · z∈[-63,+91]mm · 7 of 52 slices shown (2 of 2)]
[im 1/52]
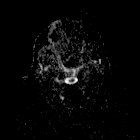
[im 9/52]
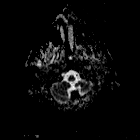
[im 18/52]
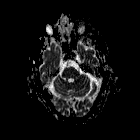
[im 26/52]
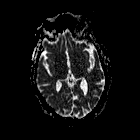
[im 35/52]
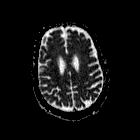
[im 43/52]
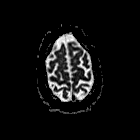
[im 52/52]
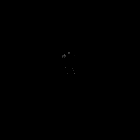

[Series 5: T2 · axial · 5.0mm · 0.45mm/px · z∈[-68,+86]mm · 3 of 25 slices shown (1 of 2)]
[im 1/25]
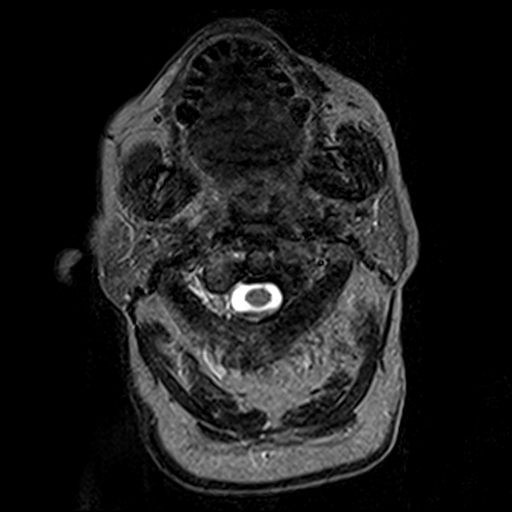
[im 13/25]
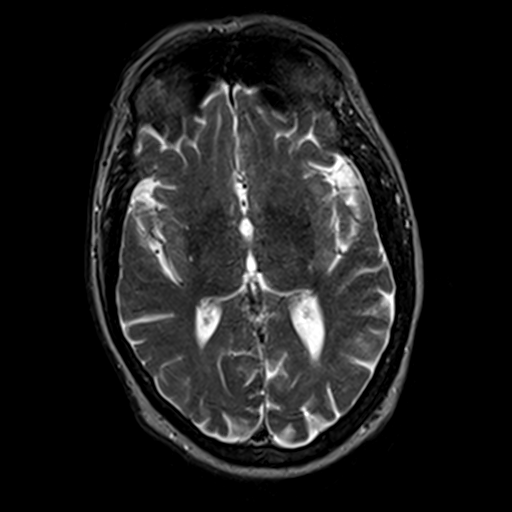
[im 25/25]
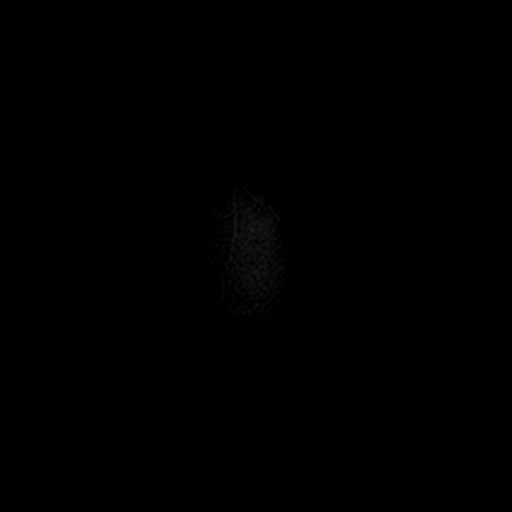

[Series 6: T2 · axial · 5.0mm · 0.45mm/px · z∈[-68,+86]mm · 3 of 25 slices shown (2 of 2)]
[im 1/25]
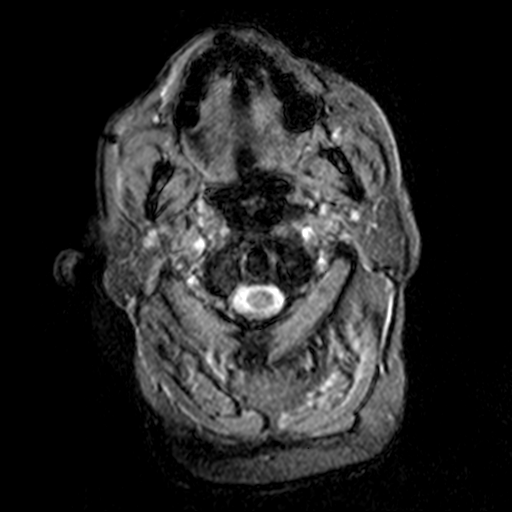
[im 13/25]
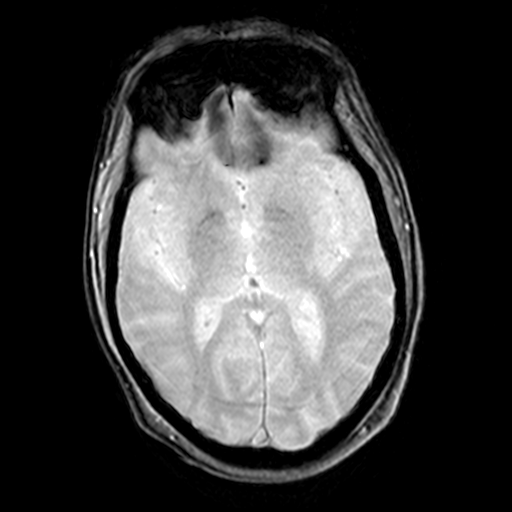
[im 25/25]
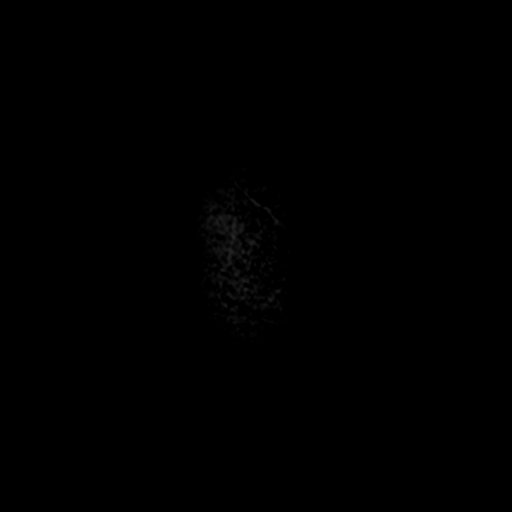

[Series 7: FLAIR · axial · 4.0mm · 0.45mm/px · z∈[-68,+86]mm · 4 of 29 slices shown]
[im 1/29]
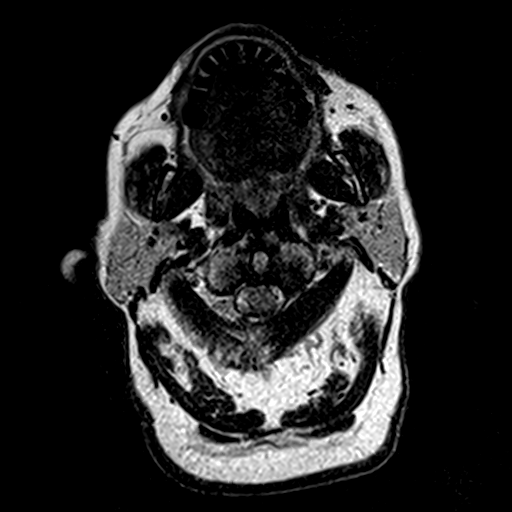
[im 10/29]
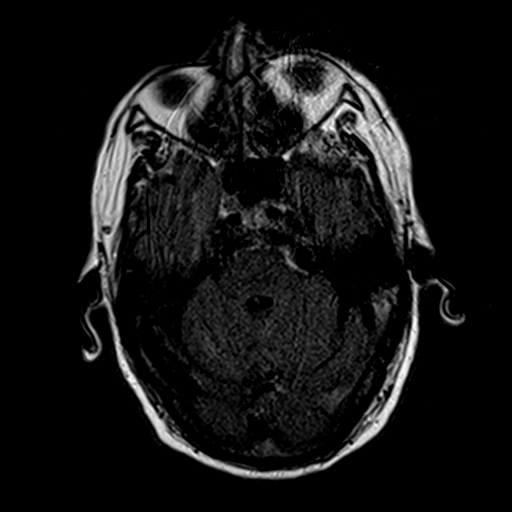
[im 19/29]
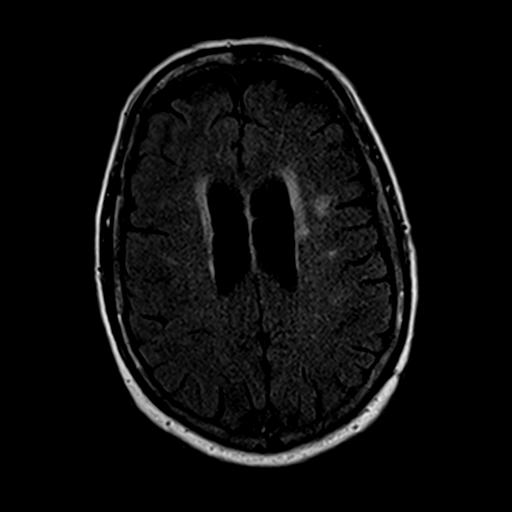
[im 29/29]
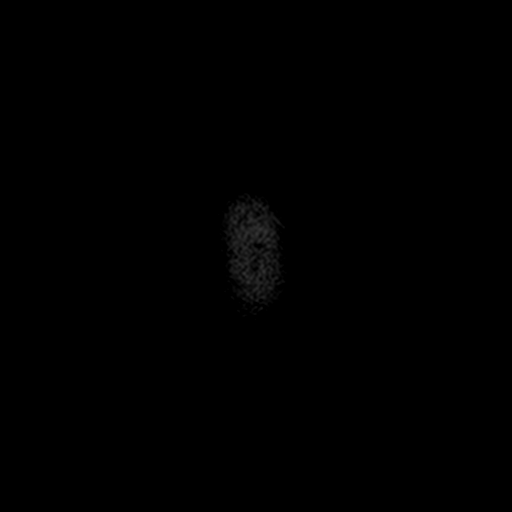

[Series 9: T2 post-contrast · coronal · 5.0mm · 0.45mm/px · 4 of 31 slices shown]
[im 1/31]
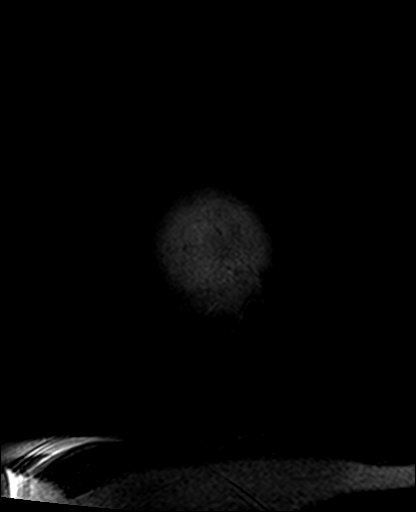
[im 11/31]
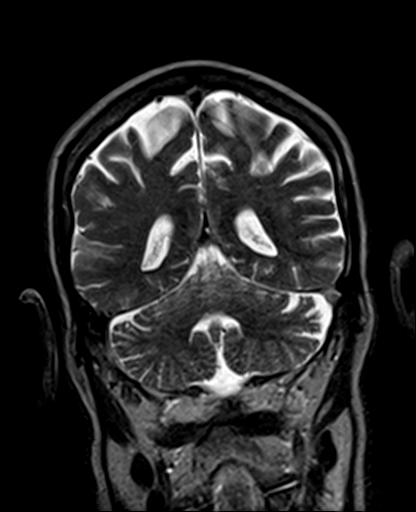
[im 21/31]
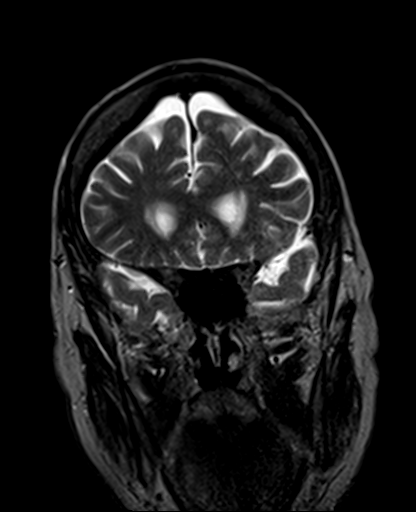
[im 31/31]
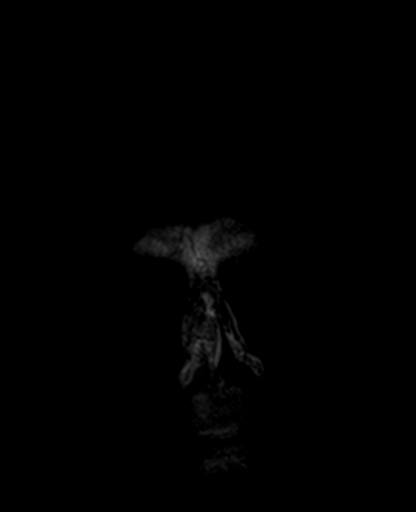

[40 of 48 positions shown; findings below may reference images not displayed]

FINDINGS: Brain: Diffusion imaging does not show any acute or subacute
infarction. Minimal chronic small-vessel change of the pons. No
focal cerebellar finding. Cerebral hemispheres show mild to moderate
chronic small-vessel ischemic changes of the hemispheric white
matter. No cortical or large vessel territory infarction. No mass
lesion, hemorrhage, hydrocephalus or extra-axial collection.

Vascular: Major vessels at the base of the brain show flow.

Skull and upper cervical spine: Negative

Sinuses/Orbits: Sinuses are clear. Orbits appear normal. Previous
lens implants.

Other: None
IMPRESSION: No acute or reversible finding. Mild to moderate chronic
small-vessel ischemic changes of the cerebral hemispheric white
matter. No abnormality seen to explain headache.

## 2021-01-07 ENCOUNTER — Encounter: Payer: Self-pay | Admitting: Family Medicine

## 2021-01-31 ENCOUNTER — Other Ambulatory Visit: Payer: Self-pay

## 2021-02-01 ENCOUNTER — Encounter: Payer: Self-pay | Admitting: Family Medicine

## 2021-02-01 ENCOUNTER — Ambulatory Visit (INDEPENDENT_AMBULATORY_CARE_PROVIDER_SITE_OTHER): Payer: Medicare Other | Admitting: Family Medicine

## 2021-02-01 VITALS — BP 110/70 | HR 86 | Temp 98.0°F | Resp 16 | Ht 72.0 in | Wt 228.2 lb

## 2021-02-01 DIAGNOSIS — F0781 Postconcussional syndrome: Secondary | ICD-10-CM | POA: Diagnosis not present

## 2021-02-01 DIAGNOSIS — R519 Headache, unspecified: Secondary | ICD-10-CM | POA: Diagnosis not present
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
## 2021-02-16 ENCOUNTER — Other Ambulatory Visit: Payer: Self-pay

## 2021-02-16 ENCOUNTER — Encounter: Payer: Self-pay | Admitting: Neurology

## 2021-02-16 ENCOUNTER — Ambulatory Visit (INDEPENDENT_AMBULATORY_CARE_PROVIDER_SITE_OTHER): Payer: Medicare Other | Admitting: Neurology

## 2021-02-16 VITALS — BP 130/70 | HR 91 | Resp 18 | Ht 72.0 in | Wt 228.0 lb

## 2021-02-16 DIAGNOSIS — G44329 Chronic post-traumatic headache, not intractable: Secondary | ICD-10-CM

## 2021-02-16 MED ORDER — NORTRIPTYLINE HCL 10 MG PO CAPS: 10.0000 mg | ORAL_CAPSULE | Freq: Every day | ORAL | 5 refills | Status: DC

## 2021-02-16 NOTE — Patient Instructions (Signed)
1.  Start nortriptyline 10mg  at bedtime.  If headaches not improved in 6 weeks, contact me and we can increase dose. 2.  Continue topiramate 50mg  twice daily  3.  May take Tylenol as needed but limit use of pain relievers to no more than 2 days out of week to prevent risk of rebound or medication-overuse headache. 4.  Keep headache diary

## 2021-02-17 ENCOUNTER — Other Ambulatory Visit: Payer: Self-pay | Admitting: Family Medicine

## 2021-03-05 ENCOUNTER — Other Ambulatory Visit: Payer: Self-pay | Admitting: Family Medicine

## 2021-03-29 LAB — CBC AND DIFFERENTIAL: Hemoglobin: 12.5 — AB (ref 13.5–17.5)

## 2021-03-29 LAB — BASIC METABOLIC PANEL
BUN: 41 — AB (ref 4–21)
CO2: 20 (ref 13–22)
Chloride: 103 (ref 99–108)
Creatinine: 2.1 — AB (ref <=1.3)
Glucose: 135
Potassium: 4.6 (ref 3.4–5.3)
Sodium: 136 — AB (ref 137–147)

## 2021-03-29 LAB — COMPREHENSIVE METABOLIC PANEL
Albumin: 3.7 (ref 3.5–5.0)
Calcium: 9.3 (ref 8.7–10.7)
GFR calc non Af Amer: 31
Globulin: 2.8

## 2021-04-04 ENCOUNTER — Other Ambulatory Visit: Payer: Self-pay

## 2021-04-04 ENCOUNTER — Encounter: Payer: Self-pay | Admitting: Family Medicine

## 2021-04-04 ENCOUNTER — Ambulatory Visit (INDEPENDENT_AMBULATORY_CARE_PROVIDER_SITE_OTHER): Payer: Medicare Other | Admitting: Family Medicine

## 2021-04-04 VITALS — BP 112/72 | HR 94 | Temp 97.5°F | Resp 16 | Ht 72.0 in | Wt 225.2 lb

## 2021-04-04 DIAGNOSIS — E7800 Pure hypercholesterolemia, unspecified: Secondary | ICD-10-CM

## 2021-04-04 DIAGNOSIS — N183 Chronic kidney disease, stage 3 unspecified: Secondary | ICD-10-CM

## 2021-04-04 DIAGNOSIS — R809 Proteinuria, unspecified: Secondary | ICD-10-CM

## 2021-04-04 DIAGNOSIS — I10 Essential (primary) hypertension: Secondary | ICD-10-CM | POA: Diagnosis not present

## 2021-04-04 DIAGNOSIS — E1129 Type 2 diabetes mellitus with other diabetic kidney complication: Secondary | ICD-10-CM

## 2021-04-04 LAB — LIPID PANEL
Cholesterol: 91 mg/dL (ref 0–200)
HDL: 46.2 mg/dL (ref >39.00)
LDL Cholesterol: 31 mg/dL (ref 0–99)
NonHDL: 45.29
Total CHOL/HDL Ratio: 2
Triglycerides: 73 mg/dL (ref 0.0–149.0)
VLDL: 14.6 mg/dL (ref 0.0–40.0)

## 2021-04-04 LAB — HEMOGLOBIN A1C: Hgb A1c MFr Bld: 6.5 % (ref 4.6–6.5)

## 2021-04-04 MED ORDER — TOPIRAMATE 50 MG PO TABS: 50.0000 mg | ORAL_TABLET | Freq: Two times a day (BID) | ORAL | 3 refills | Status: DC

## 2021-04-04 MED ORDER — ATORVASTATIN CALCIUM 10 MG PO TABS: 10.0000 mg | ORAL_TABLET | Freq: Every day | ORAL | 3 refills | Status: DC

## 2021-04-04 MED ORDER — TETANUS-DIPHTH-ACELL PERTUSSIS 5-2-15.5 LF-MCG/0.5 IM SUSP: 0.5000 mL | Freq: Once | INTRAMUSCULAR | 0 refills | Status: AC
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
## 2021-04-11 ENCOUNTER — Telehealth: Payer: Self-pay | Admitting: Family Medicine

## 2021-04-11 NOTE — Telephone Encounter (Signed)
Left message for patient to schedule Annual Wellness Visit.  Please schedule with Nurse Health Advisor Caroleen Hamman, RN at [facility].

## 2021-04-25 ENCOUNTER — Encounter: Payer: Self-pay | Admitting: Family Medicine

## 2021-04-25 ENCOUNTER — Ambulatory Visit (HOSPITAL_BASED_OUTPATIENT_CLINIC_OR_DEPARTMENT_OTHER)
Admission: RE | Admit: 2021-04-25 | Discharge: 2021-04-25 | Disposition: A | Discharge status: Home / Self Care | Payer: Medicare Other | Source: Ambulatory Visit | Attending: Family Medicine | Admitting: Family Medicine

## 2021-04-25 ENCOUNTER — Ambulatory Visit (INDEPENDENT_AMBULATORY_CARE_PROVIDER_SITE_OTHER): Payer: Medicare Other | Admitting: Family Medicine

## 2021-04-25 ENCOUNTER — Other Ambulatory Visit: Payer: Self-pay

## 2021-04-25 VITALS — BP 146/84 | HR 105 | Temp 97.9°F | Resp 16 | Ht 72.0 in

## 2021-04-25 DIAGNOSIS — R531 Weakness: Secondary | ICD-10-CM

## 2021-04-25 DIAGNOSIS — R42 Dizziness and giddiness: Secondary | ICD-10-CM

## 2021-04-25 DIAGNOSIS — R270 Ataxia, unspecified: Secondary | ICD-10-CM

## 2021-04-25 DIAGNOSIS — I951 Orthostatic hypotension: Secondary | ICD-10-CM | POA: Diagnosis not present

## 2021-04-25 LAB — CBC WITH DIFFERENTIAL/PLATELET
Basophils Absolute: 0 K/uL (ref 0.0–0.1)
Basophils Relative: 0.6 % (ref 0.0–3.0)
Eosinophils Absolute: 0.2 K/uL (ref 0.0–0.7)
Eosinophils Relative: 4.5 % (ref 0.0–5.0)
HCT: 39.2 % (ref 39.0–52.0)
Hemoglobin: 12.9 g/dL — ABNORMAL LOW (ref 13.0–17.0)
Lymphocytes Relative: 18.4 % (ref 12.0–46.0)
Lymphs Abs: 1 K/uL (ref 0.7–4.0)
MCHC: 32.8 g/dL (ref 30.0–36.0)
MCV: 97.6 fl (ref 78.0–100.0)
Monocytes Absolute: 0.9 K/uL (ref 0.1–1.0)
Monocytes Relative: 16 % — ABNORMAL HIGH (ref 3.0–12.0)
Neutro Abs: 3.4 K/uL (ref 1.4–7.7)
Neutrophils Relative %: 60.5 % (ref 43.0–77.0)
Platelets: 185 K/uL (ref 150.0–400.0)
RBC: 4.02 Mil/uL — ABNORMAL LOW (ref 4.22–5.81)
RDW: 16.3 % — ABNORMAL HIGH (ref 11.5–15.5)
WBC: 5.6 K/uL (ref 4.0–10.5)

## 2021-04-25 LAB — BASIC METABOLIC PANEL
BUN: 38 mg/dL — ABNORMAL HIGH (ref 6–23)
CO2: 22 mEq/L (ref 19–32)
Calcium: 9.3 mg/dL (ref 8.4–10.5)
Chloride: 104 mEq/L (ref 96–112)
Creatinine, Ser: 2.01 mg/dL — ABNORMAL HIGH (ref 0.40–1.50)
GFR: 30.48 mL/min — ABNORMAL LOW (ref >60.00)
Glucose, Bld: 189 mg/dL — ABNORMAL HIGH (ref 70–99)
Potassium: 4.4 mEq/L (ref 3.5–5.1)
Sodium: 136 mEq/L (ref 135–145)

## 2021-04-25 IMAGING — CT CT HEAD W/O CM
3 series · 16 of 47 positions shown, 19 images · non-contrast
Comparison: None.

CLINICAL DATA: Dizziness.

EXAM:
CT HEAD WITHOUT CONTRAST
TECHNIQUE: Contiguous axial images were obtained from the base of the skull
through the vertex without intravenous contrast.

[Series 2: head wo · axial · 0.46mm/px · z∈[-170,-45]mm · 10 of 31 slices shown, 13 images]
[im 3/31  brain]
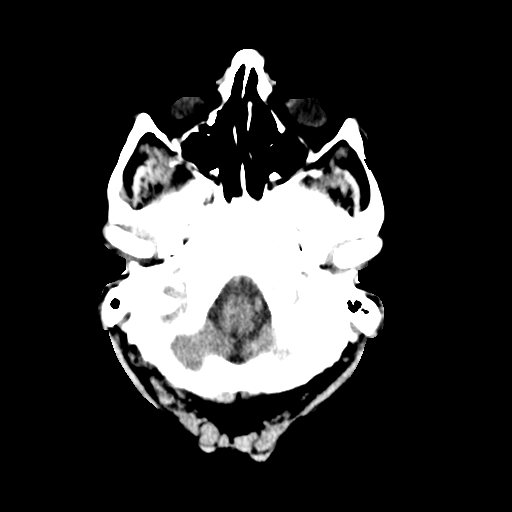
[im 3/31  bone]
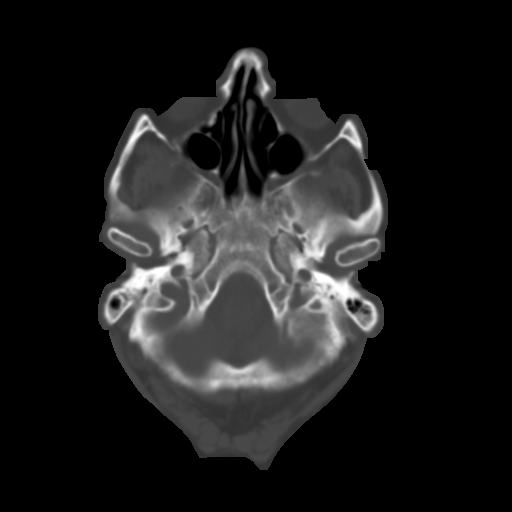
[im 6/31  brain]
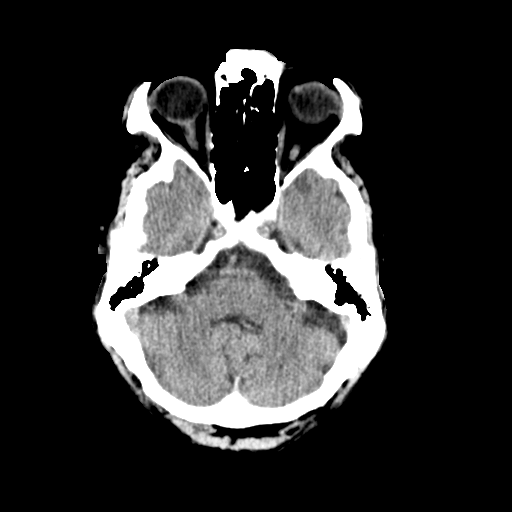
[im 9/31  brain]
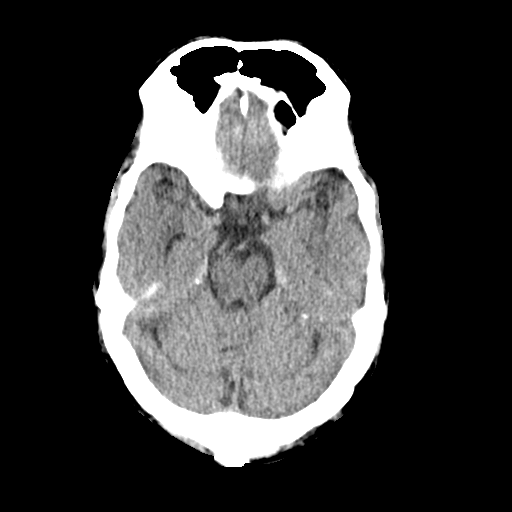
[im 11/31  brain]
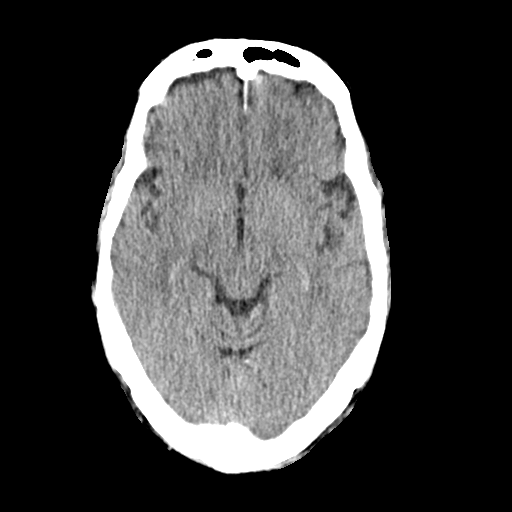
[im 14/31  brain]
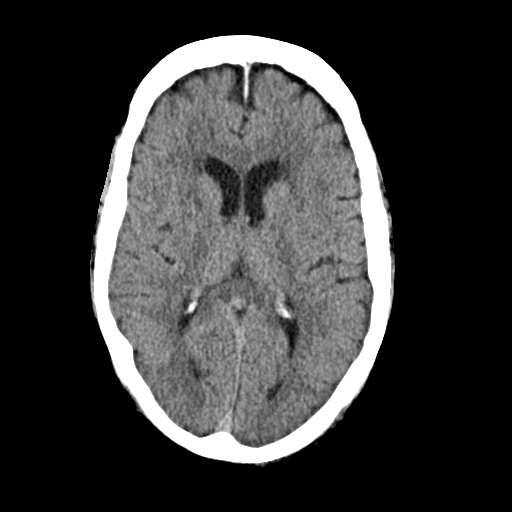
[im 14/31  bone]
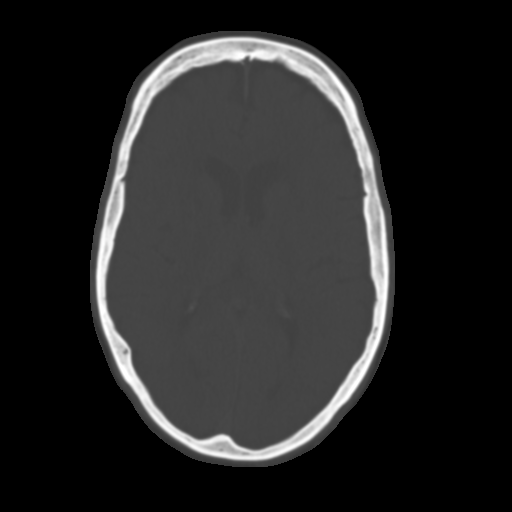
[im 17/31  brain]
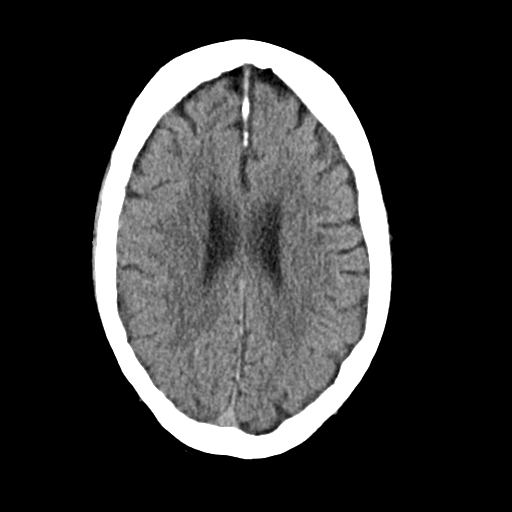
[im 20/31  brain]
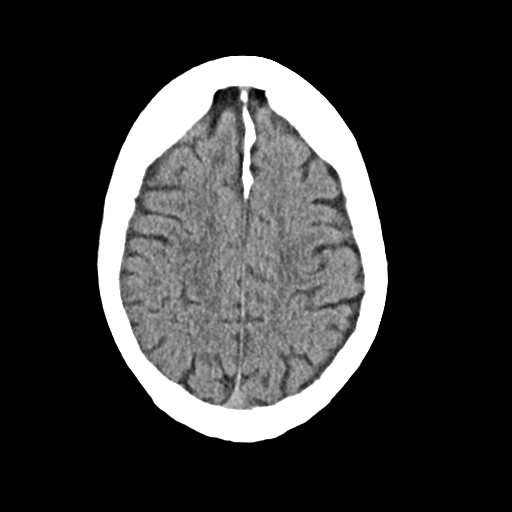
[im 23/31  brain]
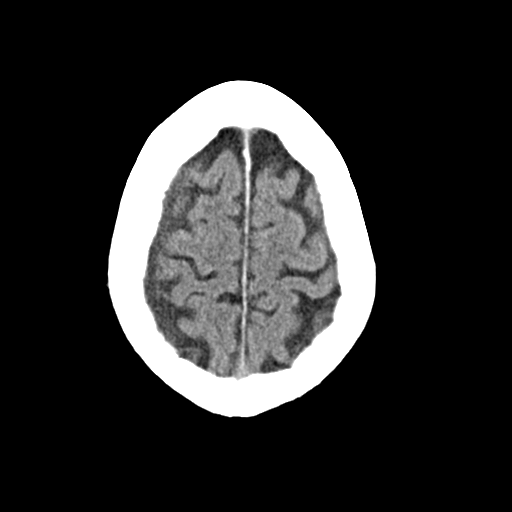
[im 25/31  brain]
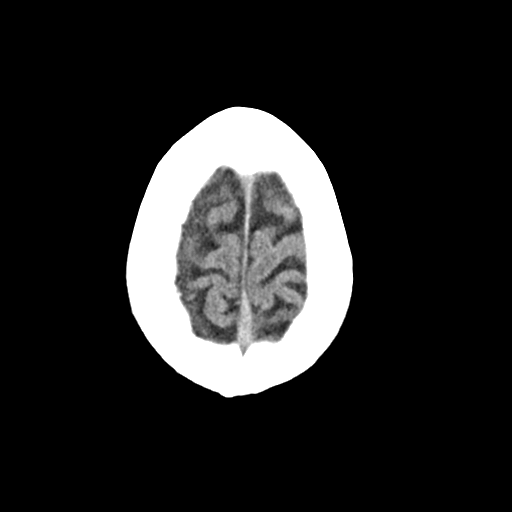
[im 25/31  bone]
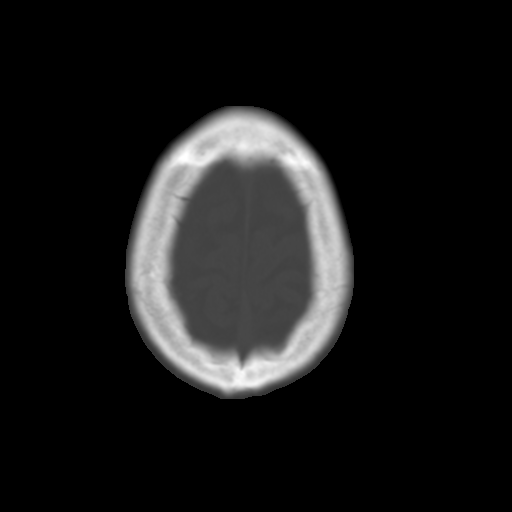
[im 28/31  brain]
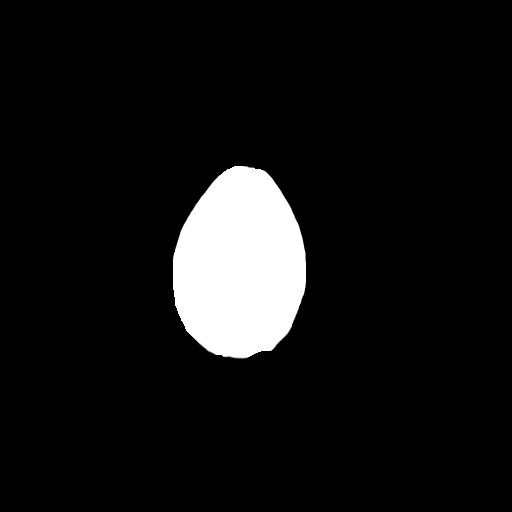

[Series 4: coronal soft · coronal · 0.30mm/px · 3 of 70 slices shown]
[im 24/70  brain]
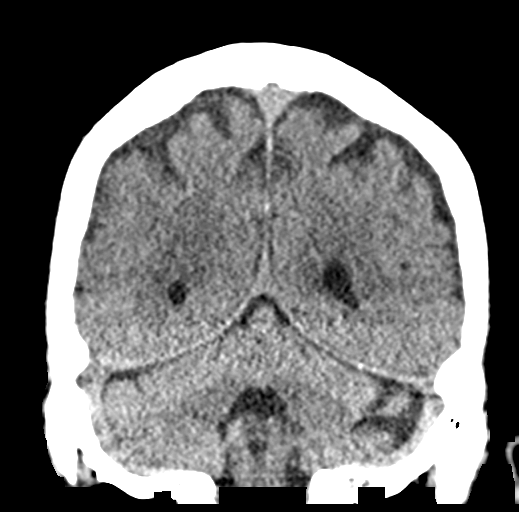
[im 31/70  brain]
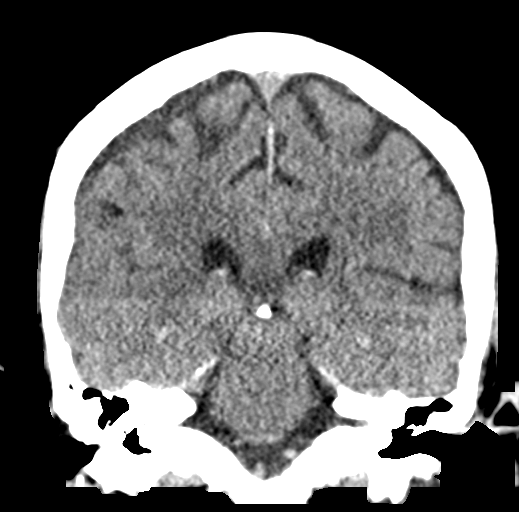
[im 39/70  brain]
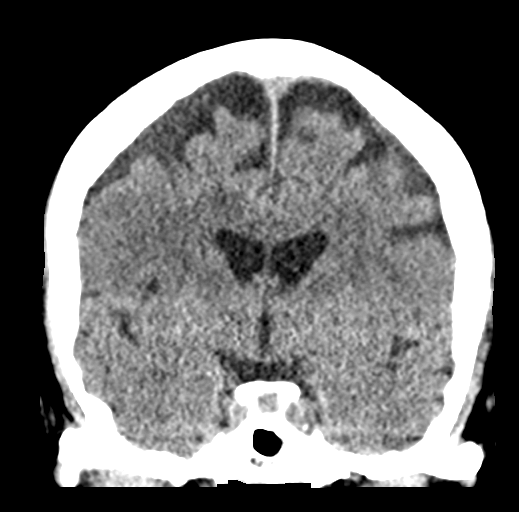

[Series 5: sag soft · sagittal · 0.32mm/px · 3 of 51 slices shown]
[im 17/51  brain]
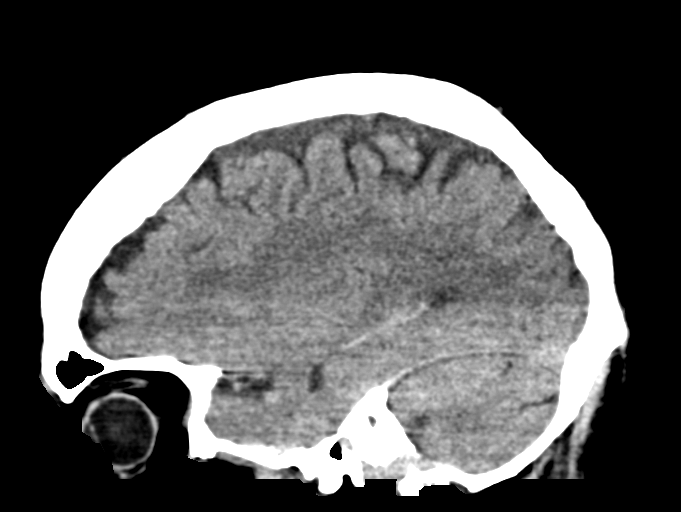
[im 26/51  brain]
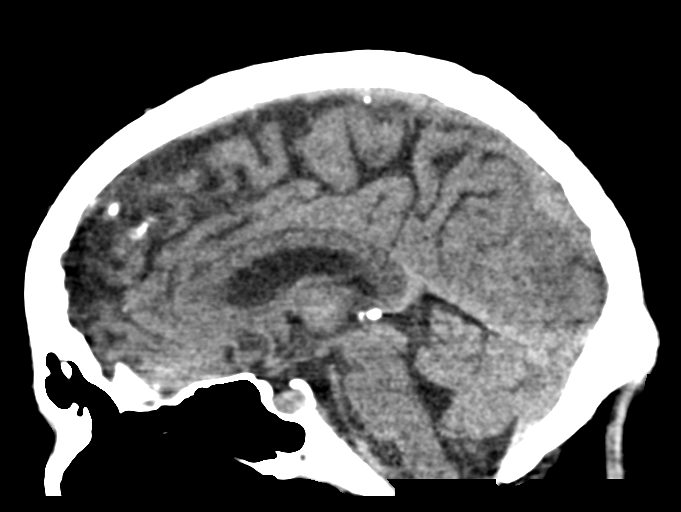
[im 34/51  brain]
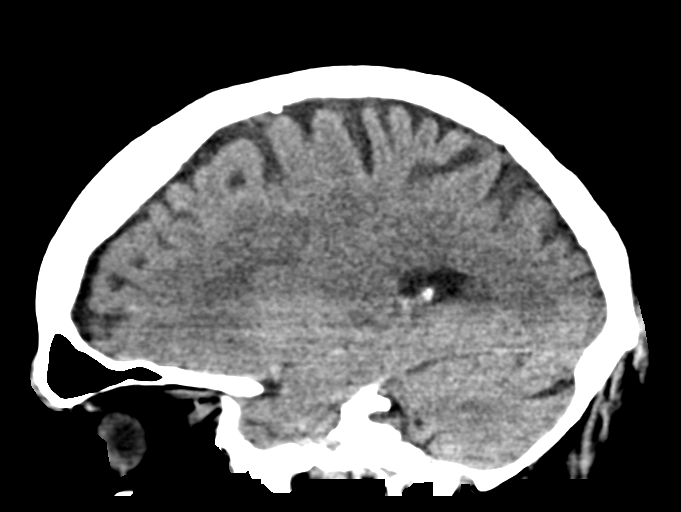

[16 of 47 positions shown; findings below may reference images not displayed]

FINDINGS: Brain: No evidence of acute infarction, hemorrhage, hydrocephalus,
extra-axial collection or mass lesion/mass effect.

Vascular: No hyperdense vessel or unexpected calcification.

Skull: Normal. Negative for fracture or focal lesion.

Sinuses/Orbits: No acute finding.

Other: None.
IMPRESSION: No acute intracranial abnormality seen.

## 2021-04-25 MED ORDER — TOPIRAMATE 25 MG PO TABS: 25.0000 mg | ORAL_TABLET | Freq: Two times a day (BID) | ORAL | 0 refills | Status: DC

## 2021-04-25 NOTE — Patient Instructions (Addendum)
Stop your nortriptyline. Stop your topamax 50mg  tab.  I sent new prescription for topamax 25mg : take 1 twice a day until I see you again 1 week.
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
## 2021-05-02 ENCOUNTER — Other Ambulatory Visit: Payer: Self-pay

## 2021-05-02 ENCOUNTER — Ambulatory Visit (INDEPENDENT_AMBULATORY_CARE_PROVIDER_SITE_OTHER): Payer: Medicare Other | Admitting: Family Medicine

## 2021-05-02 ENCOUNTER — Encounter: Payer: Self-pay | Admitting: Family Medicine

## 2021-05-02 ENCOUNTER — Telehealth: Payer: Self-pay

## 2021-05-02 VITALS — BP 128/79 | HR 103 | Temp 97.4°F | Resp 16 | Ht 72.0 in | Wt 224.6 lb

## 2021-05-02 DIAGNOSIS — R42 Dizziness and giddiness: Secondary | ICD-10-CM | POA: Diagnosis not present

## 2021-05-02 DIAGNOSIS — R2681 Unsteadiness on feet: Secondary | ICD-10-CM

## 2021-05-02 DIAGNOSIS — R531 Weakness: Secondary | ICD-10-CM | POA: Diagnosis not present

## 2021-05-02 LAB — SEDIMENTATION RATE: Sed Rate: 46 mm/hr — ABNORMAL HIGH (ref 0–20)

## 2021-05-02 LAB — TSH: TSH: 3.12 u[IU]/mL (ref 0.35–4.50)

## 2021-05-02 LAB — MAGNESIUM: Magnesium: 2 mg/dL (ref 1.5–2.5)

## 2021-05-02 LAB — CK: Total CK: 38 U/L (ref 7–232)

## 2021-05-02 MED ORDER — TOPIRAMATE 25 MG PO TABS: ORAL_TABLET | ORAL | 0 refills | Status: DC
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
## 2021-05-02 NOTE — Telephone Encounter (Signed)
Pt was seen in office today for appt, per PCP call Dr.Jaffe's office to get earliest available appt. He has only been seen for chronic HA's. Would need new referral for current issues: Disequilibrium, generalized weakness, and unstable gait. Dr.Jaffe normally does 20 minute appts. Referral entered for appt within 4 weeks. Provider verbally made aware

## 2021-05-02 NOTE — Patient Instructions (Signed)
Stop nortriptyline.  Decrease topiramate (topamax) to ONE 25mg  tab once a day.

## 2021-05-03 ENCOUNTER — Other Ambulatory Visit (INDEPENDENT_AMBULATORY_CARE_PROVIDER_SITE_OTHER): Payer: Medicare Other

## 2021-05-03 DIAGNOSIS — R531 Weakness: Secondary | ICD-10-CM

## 2021-05-03 LAB — C-REACTIVE PROTEIN: CRP: 1.1 mg/dL (ref 0.5–20.0)
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
## 2021-05-08 ENCOUNTER — Encounter: Payer: Self-pay | Admitting: Neurology

## 2021-05-08 ENCOUNTER — Ambulatory Visit (INDEPENDENT_AMBULATORY_CARE_PROVIDER_SITE_OTHER): Payer: Medicare Other | Admitting: Neurology

## 2021-05-08 ENCOUNTER — Other Ambulatory Visit: Payer: Self-pay

## 2021-05-08 VITALS — BP 103/67 | HR 101 | Ht 72.0 in | Wt 222.6 lb

## 2021-05-08 DIAGNOSIS — R42 Dizziness and giddiness: Secondary | ICD-10-CM | POA: Diagnosis not present

## 2021-05-08 DIAGNOSIS — H93A2 Pulsatile tinnitus, left ear: Secondary | ICD-10-CM

## 2021-05-08 NOTE — Patient Instructions (Addendum)
We will continue to see how you do off of medication over the next couple of weeks  Will check MRA of head and neck Follow up next available

## 2021-05-09 ENCOUNTER — Encounter: Payer: Self-pay | Admitting: Family Medicine

## 2021-05-09 ENCOUNTER — Ambulatory Visit (INDEPENDENT_AMBULATORY_CARE_PROVIDER_SITE_OTHER): Payer: Medicare Other | Admitting: Family Medicine

## 2021-05-09 VITALS — BP 119/72 | HR 104 | Temp 97.4°F | Wt 223.2 lb

## 2021-05-09 DIAGNOSIS — R42 Dizziness and giddiness: Secondary | ICD-10-CM | POA: Diagnosis not present

## 2021-05-09 DIAGNOSIS — T887XXA Unspecified adverse effect of drug or medicament, initial encounter: Secondary | ICD-10-CM | POA: Diagnosis not present

## 2021-05-09 DIAGNOSIS — R531 Weakness: Secondary | ICD-10-CM | POA: Diagnosis not present
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
## 2021-05-11 LAB — MYASTHENIA GRAVIS PROFILE
AChR Binding Ab, Serum: <0.03 nmol/L (ref 0.00–0.24)
Acetylchol Block Ab: 20 % (ref 0–25)

## 2021-05-11 LAB — MUSK ANTIBODIES: MuSK Antibodies: <1 U/mL

## 2021-05-19 ENCOUNTER — Ambulatory Visit
Admission: RE | Admit: 2021-05-19 | Discharge: 2021-05-19 | Disposition: A | Discharge status: Home / Self Care | Payer: Medicare Other | Source: Ambulatory Visit | Attending: Neurology | Admitting: Neurology

## 2021-05-19 DIAGNOSIS — R42 Dizziness and giddiness: Secondary | ICD-10-CM

## 2021-05-19 DIAGNOSIS — H93A2 Pulsatile tinnitus, left ear: Secondary | ICD-10-CM

## 2021-05-19 IMAGING — MR MR MRA NECK WO/W CM
6 series · 36 of 48 positions shown · IV contrast (20 ml multihance)
Comparison: Brain MRI [DATE]

CLINICAL DATA: Pulsatile tinnitus

EXAM:
MRA NECK WITHOUT AND WITH CONTRAST
MRA HEAD WITHOUT CONTRAST
TECHNIQUE: Multiplanar and multiecho pulse sequences of the neck were obtained
without and with intravenous contrast. Angiographic images of the
neck were obtained using MRA technique without and with intravenous
contrast; Angiographic images of the Circle of Willis were obtained
using MRA technique without intravenous contrast.
CONTRAST:  20mL MULTIHANCE GADOBENATE DIMEGLUMINE 529 MG/ML IV SOLN

[Series 4: fl_tof_2d · axial · 3.0mm · 0.39mm/px · z∈[-154,-56]mm · 7 of 50 slices shown]
[im 1/50]
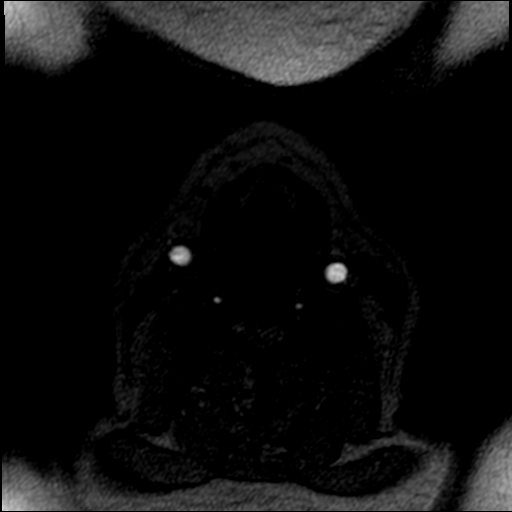
[im 9/50]
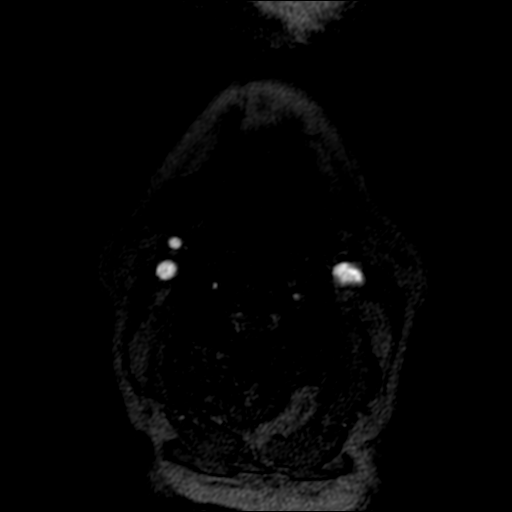
[im 17/50]
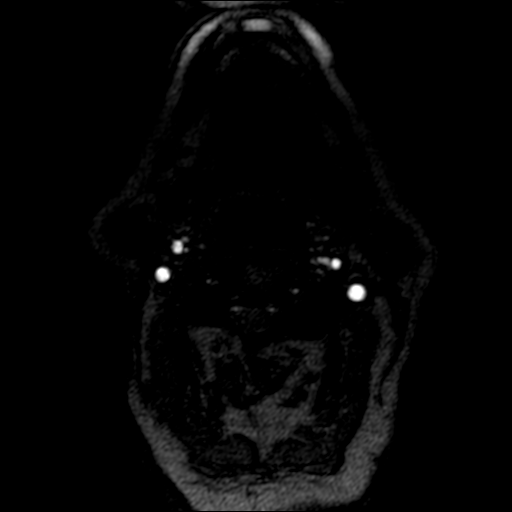
[im 25/50]
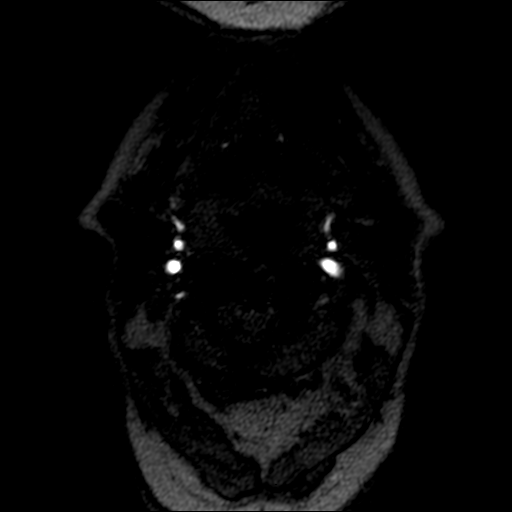
[im 33/50]
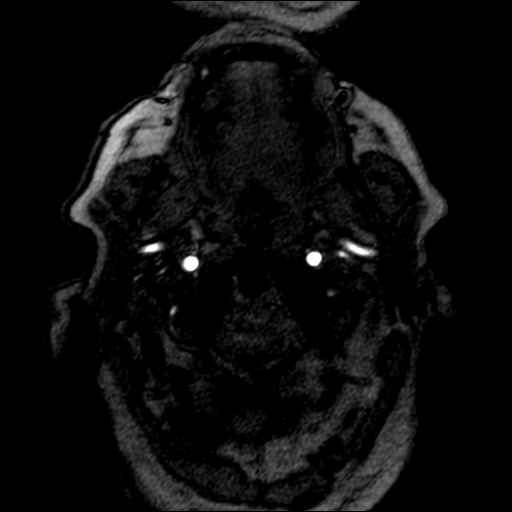
[im 41/50]
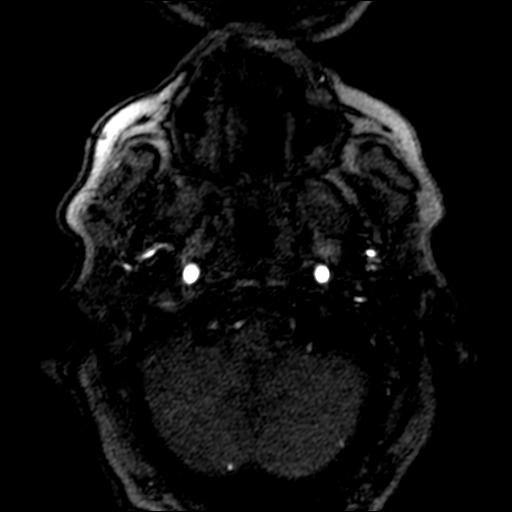
[im 50/50]
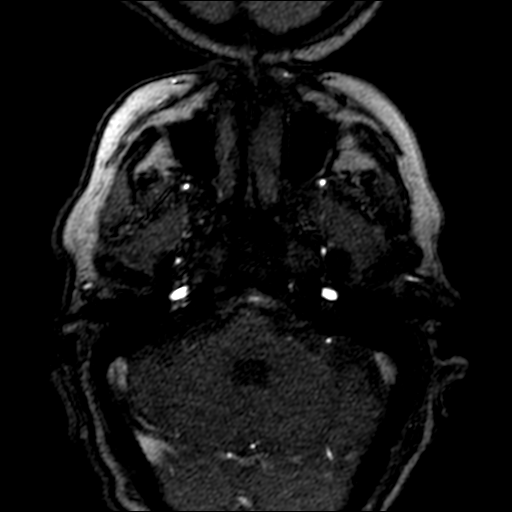

[Series 7: fl_tof_2d_mip_tra · axial · 101.5mm · 0.39mm/px · 1 of 1 slices shown]
[im 1/1]
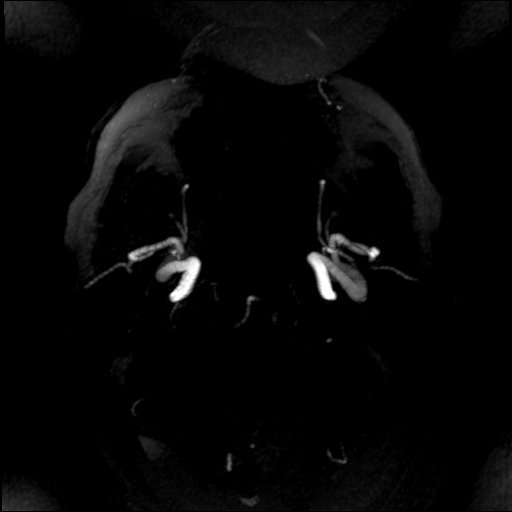

[Series 8: (id)_tt=1.0s · coronal · 0.8mm · 0.78mm/px · 10 of 80 slices shown (1 of 2)]
[im 1/80]
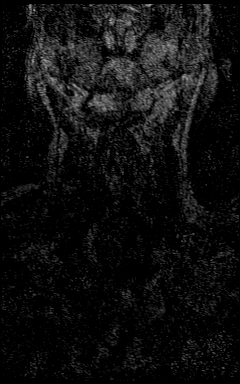
[im 7/80]
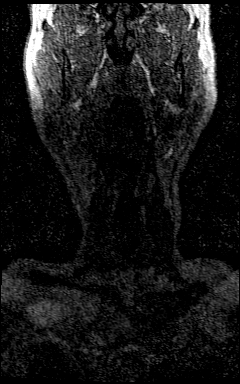
[im 14/80]
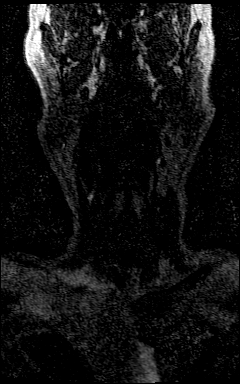
[im 27/80]
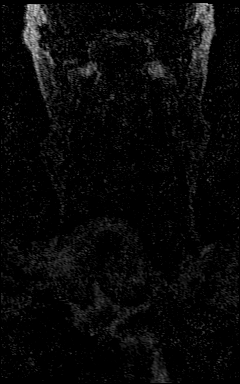
[im 33/80]
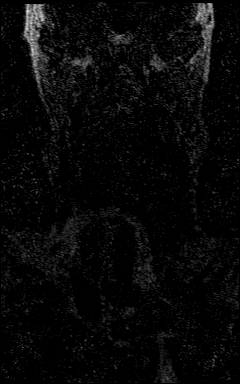
[im 40/80]
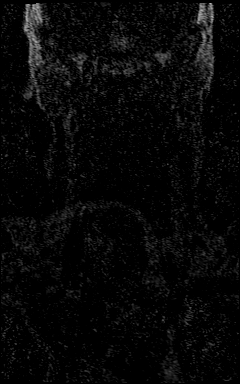
[im 47/80]
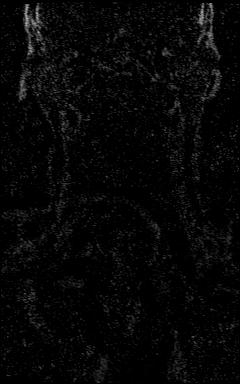
[im 53/80]
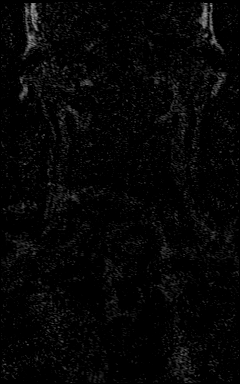
[im 66/80]
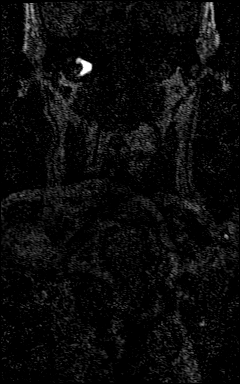
[im 80/80]
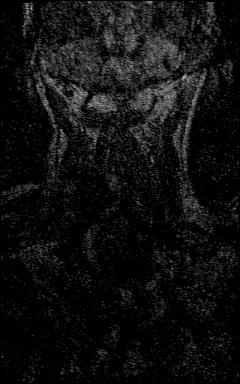

[Series 10: (id)_tt=1.0s · coronal · 0.8mm · 0.78mm/px · 9 of 80 slices shown (2 of 2)]
[im 1/80]
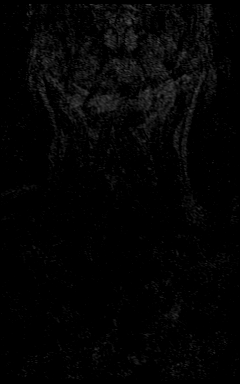
[im 14/80]
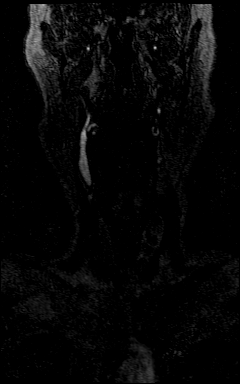
[im 27/80]
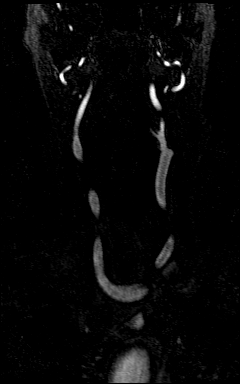
[im 33/80]
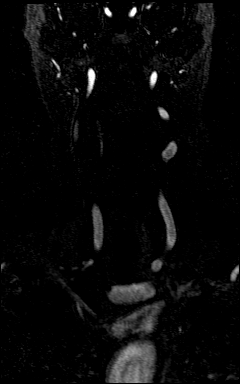
[im 40/80]
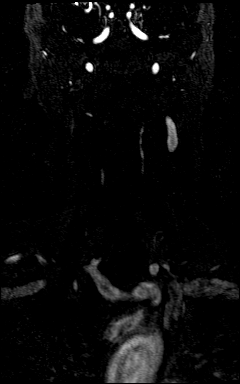
[im 47/80]
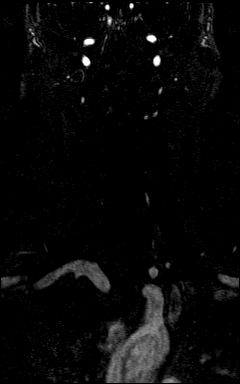
[im 53/80]
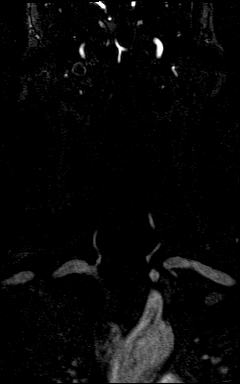
[im 66/80]
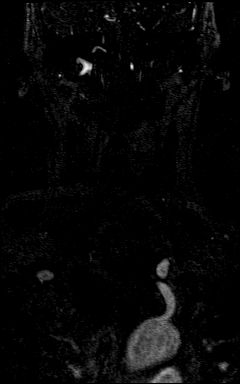
[im 80/80]
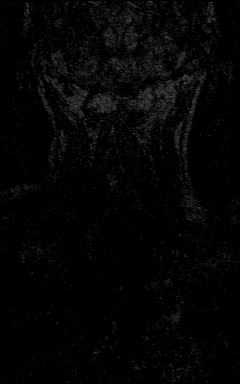

[Series 11: (id)_tt=1.0s_sub · coronal · 0.8mm · 0.78mm/px · 8 of 80 slices shown]
[im 1/80]
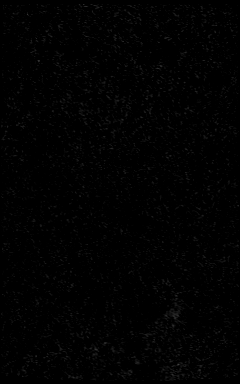
[im 14/80]
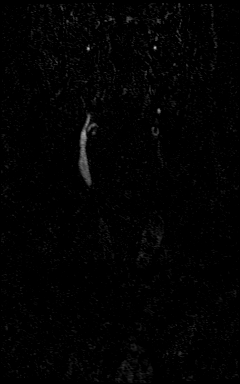
[im 27/80]
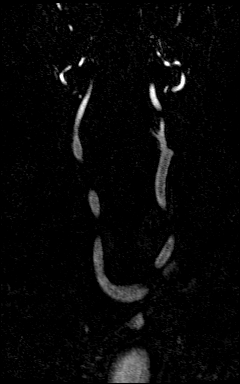
[im 33/80]
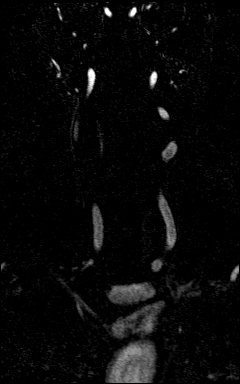
[im 47/80]
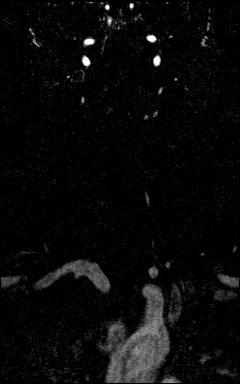
[im 53/80]
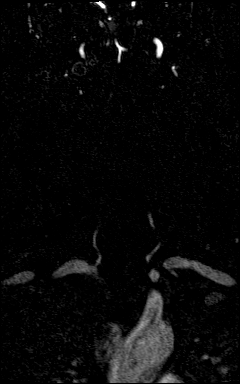
[im 66/80]
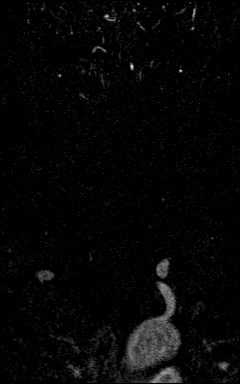
[im 80/80]
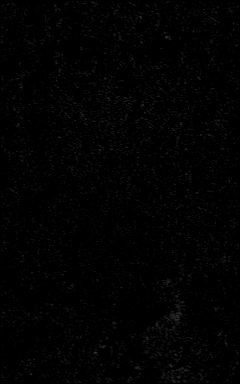

[Series 16: vertebrals · sagittal · 0.78mm/px · 1 of 3 slices shown]
[im 1/3]
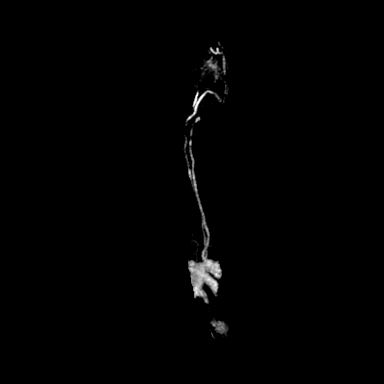

[36 of 48 positions shown; findings below may reference images not displayed]

FINDINGS: MRA NECK FINDINGS

No dilatation or dissection seen at the arch. The left subclavian
origin is distorted and partially covered. Thready, intermittent
flow in the bilateral vertebral arteries which are not seen between
the V3 segment and distal V4 segments.

The carotids appear smoothly contoured and widely patent.

MRA HEAD FINDINGS

Absent flow related signal in the mid basilar due to retrograde flow
based on neck MRA. Bilateral picas are enhancing by neck MRA. There
is a fetal type right PCA. Probable small left posterior
communicating artery. Apparent high-grade left P1 segment stenosis.

Probable mild atheromatous irregularity of MCA branches. No anterior
circulation flow limiting stenosis. Negative for aneurysm.

Partial coverage of the brain shows evidence of bilateral subdural
collection measuring up to 8-9 mm in thickness. The collections are
homogeneous and likely nonacute in this setting. Comparison is made
with the [DATE] head CT.

ASAP, these results will be called to the ordering clinician or
representative by the Radiologist Assistant, and communication
documented in the PACS or [REDACTED].
IMPRESSION: 1. Occluded appearance of the bilateral distal vertebral arteries
with distal V4 segment reconstitution from retrograde basilar flow.
Flowing bilateral vertebral arteries show extensive irregularity out
of proportion to otherwise mild atheromatous changes elsewhere in
the head/neck, question underlying vertebral dissections. CTA would
be complementary.
2. Bilateral subdural collections measuring up to 9 mm in thickness,
new from a [DATE] brain MRI.

## 2021-05-19 IMAGING — MR MR MRA HEAD W/O CM
1 series · 22 of 48 positions shown · IV contrast (multihance)
Comparison: Brain MRI [DATE]

CLINICAL DATA: Pulsatile tinnitus

EXAM:
MRA NECK WITHOUT AND WITH CONTRAST
MRA HEAD WITHOUT CONTRAST
TECHNIQUE: Multiplanar and multiecho pulse sequences of the neck were obtained
without and with intravenous contrast. Angiographic images of the
neck were obtained using MRA technique without and with intravenous
contrast; Angiographic images of the Circle of Willis were obtained
using MRA technique without intravenous contrast.
CONTRAST:  20mL MULTIHANCE GADOBENATE DIMEGLUMINE 529 MG/ML IV SOLN

[Series 3: tof_3d_multi-slab · axial · 0.7mm · 0.35mm/px · z∈[-42,+50]mm · 22 of 143 slices shown]
[im 1/143]
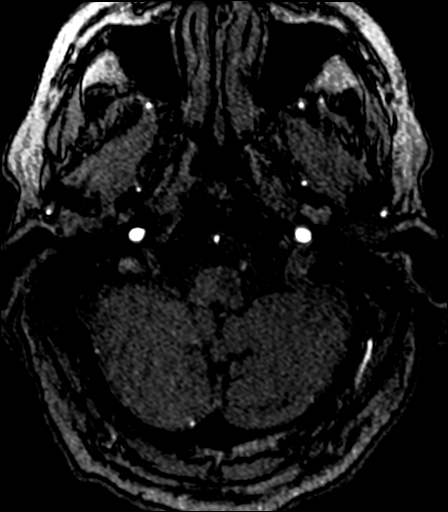
[im 4/143]
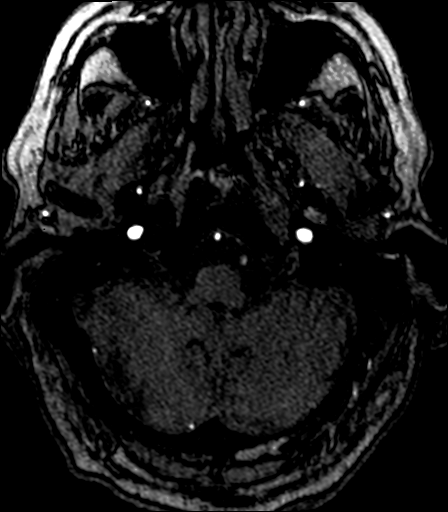
[im 7/143]
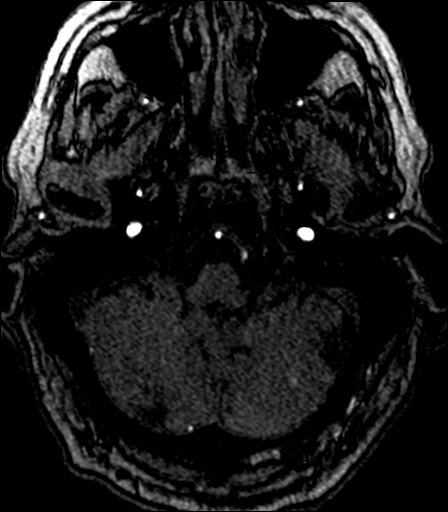
[im 10/143]
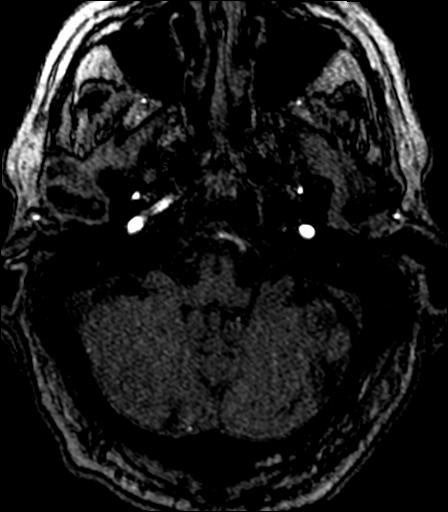
[im 13/143]
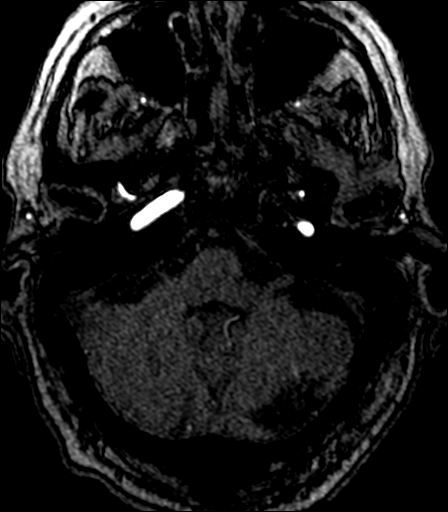
[im 16/143]
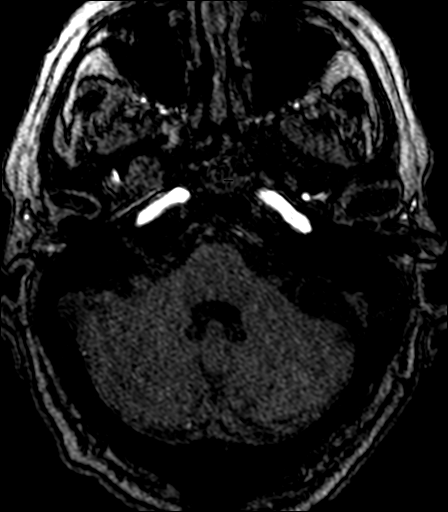
[im 19/143]
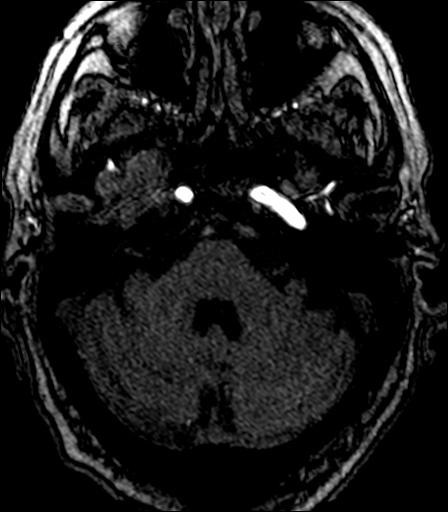
[im 22/143]
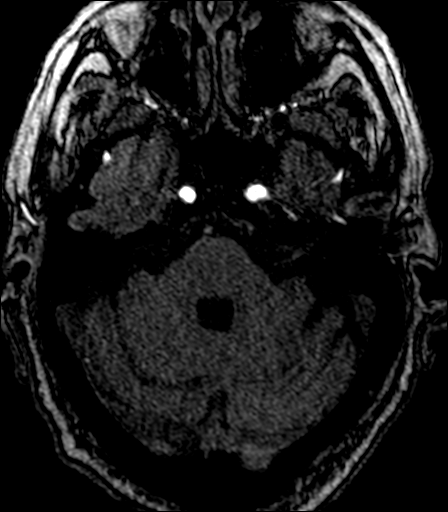
[im 25/143]
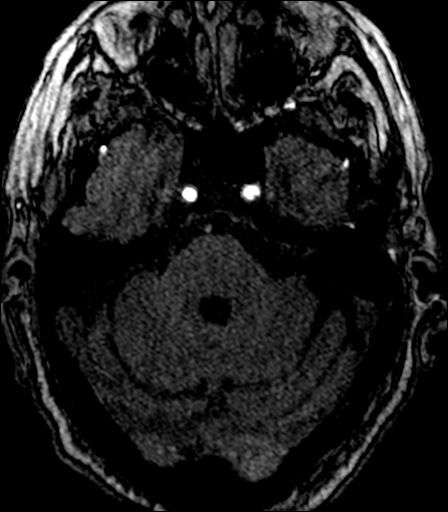
[im 28/143]
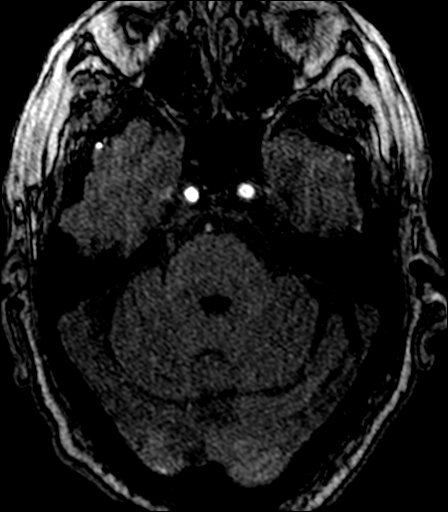
[im 31/143]
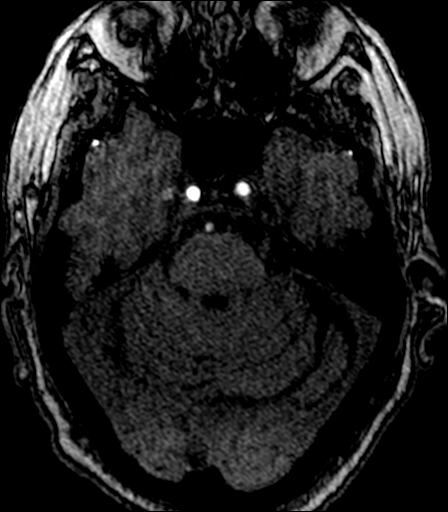
[im 34/143]
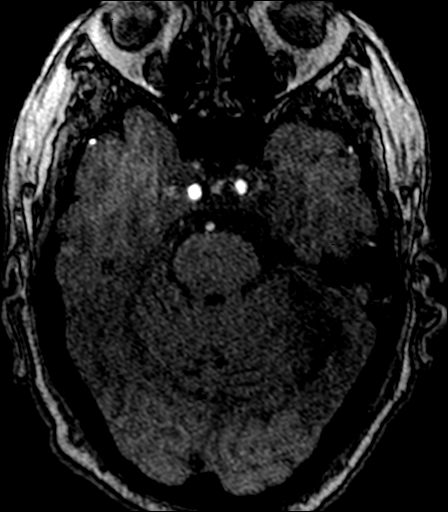
[im 37/143]
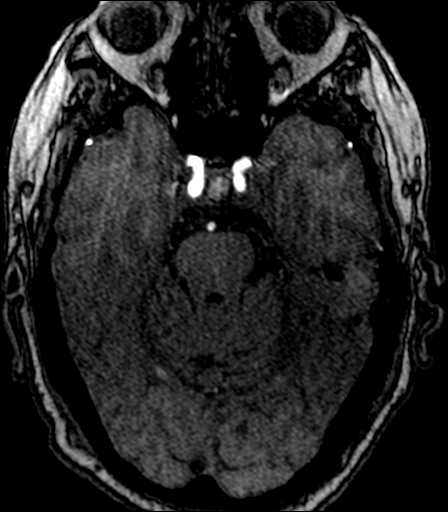
[im 40/143]
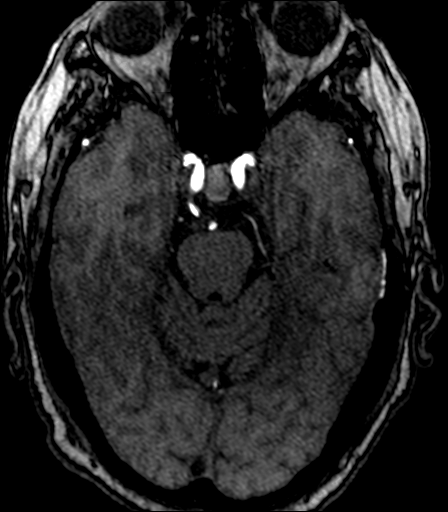
[im 46/143]
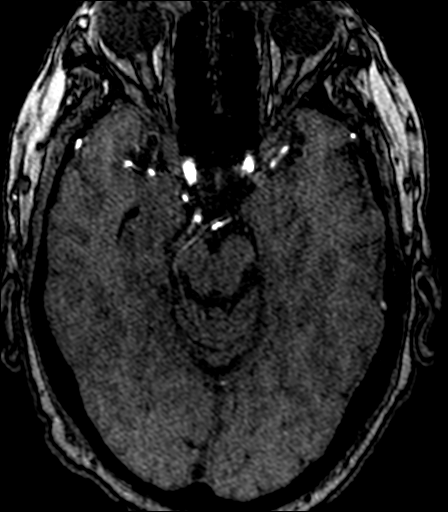
[im 64/143]
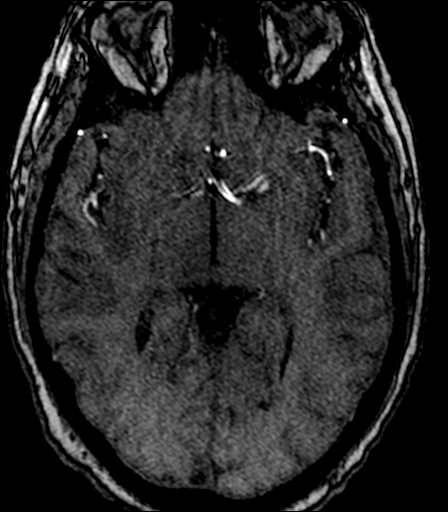
[im 73/143]
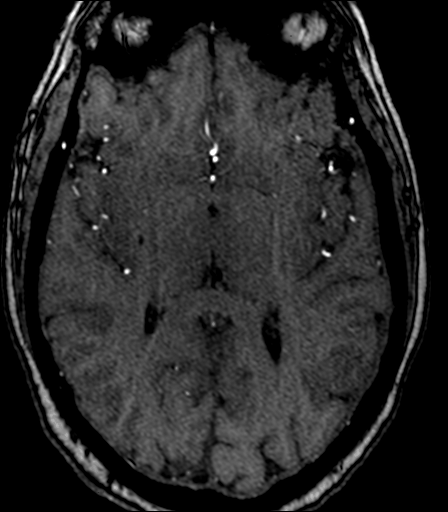
[im 82/143]
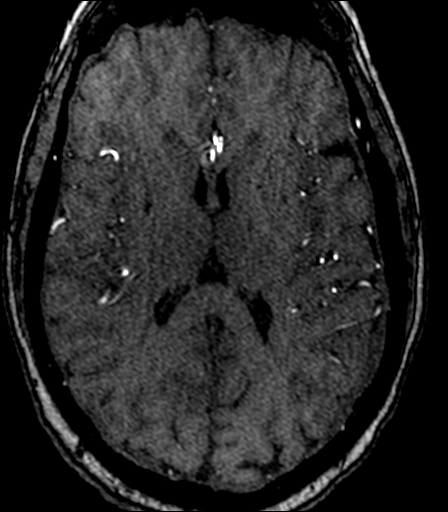
[im 100/143]
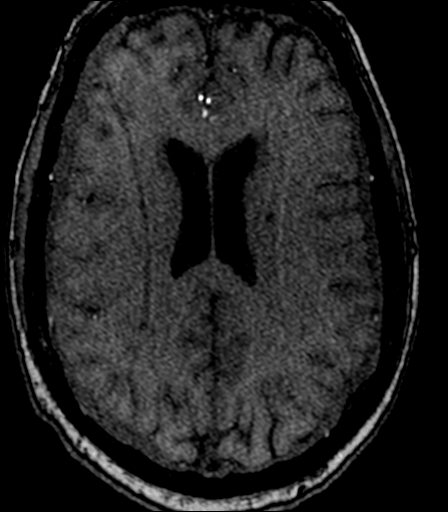
[im 118/143]
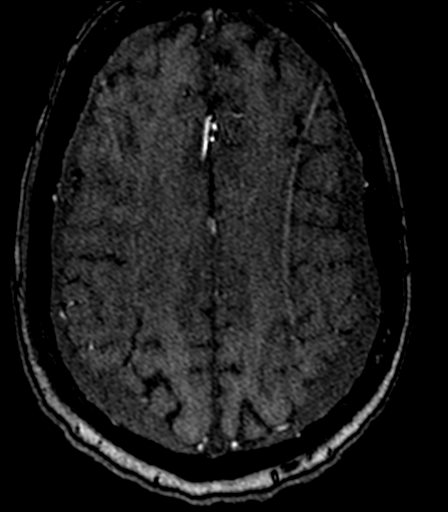
[im 121/143]
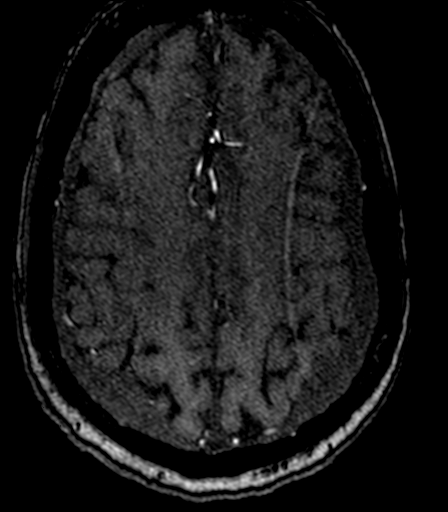
[im 136/143]
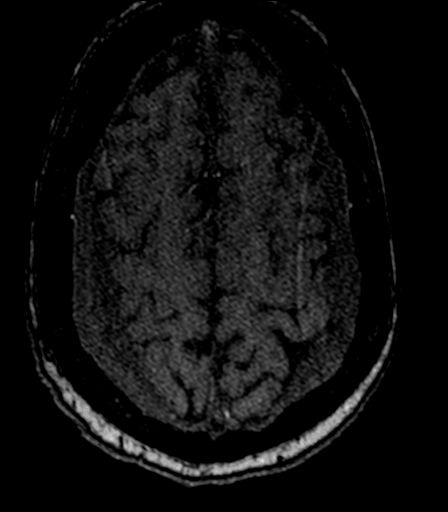

[22 of 48 positions shown; findings below may reference images not displayed]

FINDINGS: MRA NECK FINDINGS

No dilatation or dissection seen at the arch. The left subclavian
origin is distorted and partially covered. Thready, intermittent
flow in the bilateral vertebral arteries which are not seen between
the V3 segment and distal V4 segments.

The carotids appear smoothly contoured and widely patent.

MRA HEAD FINDINGS

Absent flow related signal in the mid basilar due to retrograde flow
based on neck MRA. Bilateral picas are enhancing by neck MRA. There
is a fetal type right PCA. Probable small left posterior
communicating artery. Apparent high-grade left P1 segment stenosis.

Probable mild atheromatous irregularity of MCA branches. No anterior
circulation flow limiting stenosis. Negative for aneurysm.

Partial coverage of the brain shows evidence of bilateral subdural
collection measuring up to 8-9 mm in thickness. The collections are
homogeneous and likely nonacute in this setting. Comparison is made
with the [DATE] head CT.

ASAP, these results will be called to the ordering clinician or
representative by the Radiologist Assistant, and communication
documented in the PACS or [REDACTED].
IMPRESSION: 1. Occluded appearance of the bilateral distal vertebral arteries
with distal V4 segment reconstitution from retrograde basilar flow.
Flowing bilateral vertebral arteries show extensive irregularity out
of proportion to otherwise mild atheromatous changes elsewhere in
the head/neck, question underlying vertebral dissections. CTA would
be complementary.
2. Bilateral subdural collections measuring up to 9 mm in thickness,
new from a [DATE] brain MRI.

## 2021-05-19 MED ORDER — GADOBENATE DIMEGLUMINE 529 MG/ML IV SOLN
20.0000 mL | Freq: Once | INTRAVENOUS | Status: AC | PRN
  Administered 2021-05-19: 20 mL via INTRAVENOUS

## 2021-05-24 ENCOUNTER — Telehealth: Payer: Self-pay

## 2021-05-24 DIAGNOSIS — R9089 Other abnormal findings on diagnostic imaging of central nervous system: Secondary | ICD-10-CM

## 2021-05-24 MED ORDER — DIPHENHYDRAMINE HCL 50 MG PO TABS: ORAL_TABLET | ORAL | 0 refills | Status: DC

## 2021-05-24 MED ORDER — PREDNISONE 50 MG PO TABS: ORAL_TABLET | ORAL | 0 refills | Status: DC

## 2021-05-24 NOTE — Telephone Encounter (Signed)
-----   Message from Pieter Partridge, DO sent at 05/22/2021 10:27 AM EDT ----- The MRI shows blockage of arteries in the  back of head.  Sometimes, an MRI overcalls this but a CT would be more accurate.  Would like to check CTA of head and neck for better visualization of the blood vessels

## 2021-05-24 NOTE — Telephone Encounter (Signed)
Lmovm need to know which contrast dye pt has an allergy to?

## 2021-05-24 NOTE — Telephone Encounter (Signed)
Pt wife said she isn't sure about the allergy, he may not be allergic now..that was many years ago. You can call her back if you need to

## 2021-05-25 NOTE — Telephone Encounter (Signed)
CTA head and neck ordered. Premedication sent to the pharmacy. Pt is aware.

## 2021-05-28 ENCOUNTER — Telehealth: Payer: Self-pay

## 2021-05-28 NOTE — Telephone Encounter (Signed)
Called pt to discuss his allergy to CT contrast given he has a CT scan with contrast scheduled with [place] on 06/01/21 at 3 pm. I spoke to the patients wife, who reported the patient broke out in hives after receiving CT contrast. Per the patients wife, the ordering doctor had already prescribed the pt a 13 hr prep that included prednisone and benadryl. Pt/cg verbalized understanding of the times of when to take the medications. Pt/cg has our direct number if any questions were to arise.

## 2021-05-31 ENCOUNTER — Telehealth: Payer: Self-pay

## 2021-05-31 DIAGNOSIS — R9089 Other abnormal findings on diagnostic imaging of central nervous system: Secondary | ICD-10-CM

## 2021-05-31 NOTE — Telephone Encounter (Signed)
Telephone call from pt wife. Wife noticed on the pt my chart that the CTA was cancelled Please explain.  Advise pt wife the CTA was cancelled due to elevated Creatine levels 2.01.  Per Dr.Jaffe further recommendations.

## 2021-05-31 NOTE — Telephone Encounter (Signed)
Spoke with patient's wife who is listed as a contact.   Previous MRA showed bilateral subdural collections (new since MRI from February) as well as bilateral distal vertebral artery occlusions with reconstitution from retrograde basilar flow.  Unclear if due to underlying vertebral dissections.  Denies falls since last MRI.  Unable to get CTA due to elevated creatine.  Will send to interventional radiology for cerebral angiogram.  Explained topatient's wife to her satisfaction.     Order placed

## 2021-06-01 ENCOUNTER — Other Ambulatory Visit: Payer: Medicare Other

## 2021-06-01 ENCOUNTER — Telehealth (HOSPITAL_COMMUNITY): Payer: Self-pay | Admitting: Radiology

## 2021-06-01 ENCOUNTER — Inpatient Hospital Stay: Admission: RE | Admit: 2021-06-01 | Payer: Medicare Other | Source: Ambulatory Visit

## 2021-06-01 NOTE — Telephone Encounter (Signed)
Called pt's wife to schedule cerebral angiogram at the request of Dr. Tomi Likens. Referral sent to Dr. Estanislado Pandy. Pt's wife states that she would like to be sure that this procedure is safe for the pt due to his severe kidney disease. I have sent a message to the pt's PCP to see who handles the care and treatment of the pt's kidney issues. Will call the wife back once I have gotten a recommendation concerning his kidneys and receiving contrast. JM

## 2021-06-08 ENCOUNTER — Telehealth: Payer: Self-pay | Admitting: Neurology

## 2021-06-08 ENCOUNTER — Telehealth: Payer: Self-pay | Admitting: Family Medicine

## 2021-06-08 NOTE — Telephone Encounter (Signed)
Pt wife called in, she is worried about Dave Wright. She never recvd a call from dr. Hyman Bower. She needs more info on what he needs to do. Doesn't know if he needs a blood thinner. He has a blood clot and they cant do test and she needs more answers. Please call her [phone]

## 2021-06-08 NOTE — Telephone Encounter (Signed)
I have called Dr Arlean Hopping office and spoken to Clarise Cruz who does his referrals [phone]).  She stated that she have spoken to patient's wife who refuse for patient to do the angiogram because of the contrast due to patient's kidneys.   Clarise Cruz stated that she send a message to patient's pcp Dr Anitra Lauth and have not heard anything back. After talking to me, Clarise Cruz stated that she is going to call the pcp.  I have called patient's wife and relay the message that Clarise Cruz will contact pcp. However, patient's wife stated patient does see a nephrologist. I have inform to please contact Dr Rosana Hoes office with that information. She verbalized understanding and will call them.

## 2021-06-08 NOTE — Telephone Encounter (Signed)
I called pt and spoke with his wife. IR is trying to get him set up for cerebral angiogram but needs nephrology clearance first---pt's nephrologist is Dr. Justin Mend. Wife said she has left a message with Dr. Jason Nest office today and is waiting to hear back.   I told her our office would be available if needed for help with this process. Wife expressed understanding and appreciation. I called the IR scheduler, Miles Costain, and reached VM.  I left a message notifying her of the pt's nephrologist.  I will e-mail her as well.  Signed:  Crissie Sickles, MD           06/08/2021

## 2021-06-08 NOTE — Telephone Encounter (Signed)
Dr Tomi Likens is aware of the information below

## 2021-06-08 NOTE — Telephone Encounter (Signed)
What should we do about this message?

## 2021-06-14 ENCOUNTER — Other Ambulatory Visit (HOSPITAL_COMMUNITY): Payer: Self-pay | Admitting: Interventional Radiology

## 2021-06-14 DIAGNOSIS — H93A9 Pulsatile tinnitus, unspecified ear: Secondary | ICD-10-CM

## 2021-06-15 ENCOUNTER — Other Ambulatory Visit: Payer: Self-pay | Admitting: Family Medicine

## 2021-06-18 ENCOUNTER — Ambulatory Visit (HOSPITAL_COMMUNITY)
Admission: RE | Admit: 2021-06-18 | Discharge: 2021-06-18 | Disposition: A | Discharge status: Home / Self Care | Payer: Medicare Other | Source: Ambulatory Visit | Attending: Interventional Radiology | Admitting: Interventional Radiology

## 2021-06-18 ENCOUNTER — Other Ambulatory Visit: Payer: Self-pay

## 2021-06-18 DIAGNOSIS — H93A9 Pulsatile tinnitus, unspecified ear: Secondary | ICD-10-CM

## 2021-06-20 HISTORY — PX: IR RADIOLOGIST EVAL & MGMT: IMG5224

## 2021-07-04 ENCOUNTER — Other Ambulatory Visit (HOSPITAL_COMMUNITY): Payer: Self-pay | Admitting: Interventional Radiology

## 2021-07-04 DIAGNOSIS — H93A9 Pulsatile tinnitus, unspecified ear: Secondary | ICD-10-CM

## 2021-07-05 ENCOUNTER — Ambulatory Visit (INDEPENDENT_AMBULATORY_CARE_PROVIDER_SITE_OTHER): Payer: Medicare Other | Admitting: Family Medicine

## 2021-07-05 ENCOUNTER — Telehealth (HOSPITAL_COMMUNITY): Payer: Self-pay | Admitting: Radiology

## 2021-07-05 ENCOUNTER — Other Ambulatory Visit: Payer: Self-pay

## 2021-07-05 ENCOUNTER — Telehealth (HOSPITAL_COMMUNITY): Payer: Self-pay

## 2021-07-05 ENCOUNTER — Encounter: Payer: Self-pay | Admitting: Family Medicine

## 2021-07-05 VITALS — BP 127/79 | HR 82 | Temp 97.5°F | Ht 72.0 in | Wt 226.8 lb

## 2021-07-05 DIAGNOSIS — R809 Proteinuria, unspecified: Secondary | ICD-10-CM | POA: Diagnosis not present

## 2021-07-05 DIAGNOSIS — I10 Essential (primary) hypertension: Secondary | ICD-10-CM

## 2021-07-05 DIAGNOSIS — E7800 Pure hypercholesterolemia, unspecified: Secondary | ICD-10-CM

## 2021-07-05 DIAGNOSIS — N183 Chronic kidney disease, stage 3 unspecified: Secondary | ICD-10-CM | POA: Diagnosis not present

## 2021-07-05 DIAGNOSIS — I6503 Occlusion and stenosis of bilateral vertebral arteries: Secondary | ICD-10-CM | POA: Diagnosis not present

## 2021-07-05 DIAGNOSIS — E1129 Type 2 diabetes mellitus with other diabetic kidney complication: Secondary | ICD-10-CM

## 2021-07-05 DIAGNOSIS — G450 Vertebro-basilar artery syndrome: Secondary | ICD-10-CM

## 2021-07-05 DIAGNOSIS — R519 Headache, unspecified: Secondary | ICD-10-CM

## 2021-07-05 LAB — COMPREHENSIVE METABOLIC PANEL
ALT: 20 U/L (ref 0–53)
AST: 31 U/L (ref 0–37)
Albumin: 3.9 g/dL (ref 3.5–5.2)
Alkaline Phosphatase: 51 U/L (ref 39–117)
BUN: 32 mg/dL — ABNORMAL HIGH (ref 6–23)
CO2: 27 mEq/L (ref 19–32)
Calcium: 9.5 mg/dL (ref 8.4–10.5)
Chloride: 100 mEq/L (ref 96–112)
Creatinine, Ser: 1.92 mg/dL — ABNORMAL HIGH (ref 0.40–1.50)
GFR: 32.16 mL/min — ABNORMAL LOW (ref >60.00)
Glucose, Bld: 135 mg/dL — ABNORMAL HIGH (ref 70–99)
Potassium: 4.7 mEq/L (ref 3.5–5.1)
Sodium: 135 mEq/L (ref 135–145)
Total Bilirubin: 0.7 mg/dL (ref 0.2–1.2)
Total Protein: 6.7 g/dL (ref 6.0–8.3)

## 2021-07-05 LAB — HEMOGLOBIN A1C: Hgb A1c MFr Bld: 6.8 % — ABNORMAL HIGH (ref 4.6–6.5)

## 2021-07-05 MED ORDER — FINASTERIDE 5 MG PO TABS: 5.0000 mg | ORAL_TABLET | Freq: Every day | ORAL | 3 refills | Status: DC

## 2021-07-05 NOTE — Telephone Encounter (Signed)
-----   Message from Edrick Oh, MD sent at 06/14/2021 10:06 AM EDT ----- Claris Gladden  I don't think it would hurt. Sometimes that could help mitigate an increase in creatinine after the dye.   Hassell Done    ----- Message ----- From: Edrick Oh, MD Sent: 06/13/2021  11:09 AM EDT To: [name]  Creatinine has been stable at about 2 mg/dl   I think that minimizing contrast and proceeding with caution. If the angiogram is absolutely needed then it should not be delayed   ----- Message ----- From: [name] Sent: 06/12/2021  11:23 AM EDT To: Edrick Oh, MD  Dr. Justin Mend,  Mr. Cowans has been referred by neurology to Dr. Estanislado Pandy for a cerebral angiogram. Due to his kidney disease Dr. Estanislado Pandy is requiring clearance from you due to contrast usage during the angiogram. Please advise and thank you!  Thanks [employer]
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
## 2021-07-05 NOTE — Telephone Encounter (Signed)
-----   Message from Edrick Oh, MD sent at 06/14/2021 10:06 AM EDT ----- Dave Wright  I don't think it would hurt. Sometimes that could help mitigate an increase in creatinine after the dye.   Hassell Done    ----- Message ----- From: Edrick Oh, MD Sent: 06/13/2021  11:09 AM EDT To: [name]  Creatinine has been stable at about 2 mg/dl   I think that minimizing contrast and proceeding with caution. If the angiogram is absolutely needed then it should not be delayed   ----- Message ----- From: [name] Sent: 06/12/2021  11:23 AM EDT To: Edrick Oh, MD  Dr. Justin Mend,  Mr. [name] has been referred by neurology to Dr. Estanislado Pandy for a cerebral angiogram. Due to his kidney disease Dr. Estanislado Pandy is requiring clearance from you due to contrast usage during the angiogram. Please advise and thank you!  Thanks [employer]

## 2021-07-05 NOTE — Telephone Encounter (Signed)
Called to schedule angiogram, no answer, left vm. AW 

## 2021-07-06 ENCOUNTER — Telehealth: Payer: Self-pay | Admitting: Family Medicine

## 2021-07-06 NOTE — Telephone Encounter (Signed)
Patient returned call for lab results. Please call patient to advise.

## 2021-07-06 NOTE — Telephone Encounter (Signed)
Spoke with patient regarding results/recommendations,voiced understanding.  

## 2021-07-16 ENCOUNTER — Other Ambulatory Visit: Payer: Self-pay | Admitting: Radiology

## 2021-07-17 ENCOUNTER — Other Ambulatory Visit (HOSPITAL_COMMUNITY): Payer: Self-pay | Admitting: Interventional Radiology

## 2021-07-17 ENCOUNTER — Encounter (HOSPITAL_COMMUNITY): Payer: Self-pay

## 2021-07-17 ENCOUNTER — Ambulatory Visit (HOSPITAL_COMMUNITY)
Admission: RE | Admit: 2021-07-17 | Discharge: 2021-07-17 | Disposition: A | Discharge status: Home / Self Care | Payer: Medicare Other | Source: Ambulatory Visit | Attending: Interventional Radiology | Admitting: Interventional Radiology

## 2021-07-17 ENCOUNTER — Other Ambulatory Visit: Payer: Self-pay

## 2021-07-17 DIAGNOSIS — Z794 Long term (current) use of insulin: Secondary | ICD-10-CM | POA: Insufficient documentation

## 2021-07-17 DIAGNOSIS — Z79899 Other long term (current) drug therapy: Secondary | ICD-10-CM | POA: Insufficient documentation

## 2021-07-17 DIAGNOSIS — N183 Chronic kidney disease, stage 3 unspecified: Secondary | ICD-10-CM | POA: Diagnosis not present

## 2021-07-17 DIAGNOSIS — E1122 Type 2 diabetes mellitus with diabetic chronic kidney disease: Secondary | ICD-10-CM | POA: Insufficient documentation

## 2021-07-17 DIAGNOSIS — I129 Hypertensive chronic kidney disease with stage 1 through stage 4 chronic kidney disease, or unspecified chronic kidney disease: Secondary | ICD-10-CM | POA: Diagnosis not present

## 2021-07-17 DIAGNOSIS — Z87891 Personal history of nicotine dependence: Secondary | ICD-10-CM | POA: Insufficient documentation

## 2021-07-17 DIAGNOSIS — H93A2 Pulsatile tinnitus, left ear: Secondary | ICD-10-CM | POA: Insufficient documentation

## 2021-07-17 DIAGNOSIS — Z91041 Radiographic dye allergy status: Secondary | ICD-10-CM | POA: Diagnosis not present

## 2021-07-17 DIAGNOSIS — Z7982 Long term (current) use of aspirin: Secondary | ICD-10-CM | POA: Diagnosis not present

## 2021-07-17 DIAGNOSIS — E785 Hyperlipidemia, unspecified: Secondary | ICD-10-CM | POA: Diagnosis not present

## 2021-07-17 DIAGNOSIS — H93A9 Pulsatile tinnitus, unspecified ear: Secondary | ICD-10-CM

## 2021-07-17 DIAGNOSIS — Z880 Allergy status to penicillin: Secondary | ICD-10-CM | POA: Insufficient documentation

## 2021-07-17 DIAGNOSIS — R42 Dizziness and giddiness: Secondary | ICD-10-CM | POA: Diagnosis not present

## 2021-07-17 HISTORY — PX: IR ANGIO INTRA EXTRACRAN SEL COM CAROTID INNOMINATE BILAT MOD SED: IMG5360

## 2021-07-17 HISTORY — PX: IR ANGIO VERTEBRAL SEL SUBCLAVIAN INNOMINATE BILAT MOD SED: IMG5366

## 2021-07-17 HISTORY — PX: IR US GUIDE VASC ACCESS RIGHT: IMG2390

## 2021-07-17 LAB — CBC
HCT: 38.9 % — ABNORMAL LOW (ref 39.0–52.0)
Hemoglobin: 12.4 g/dL — ABNORMAL LOW (ref 13.0–17.0)
MCH: 31.4 pg (ref 26.0–34.0)
MCHC: 31.9 g/dL (ref 30.0–36.0)
MCV: 98.5 fL (ref 80.0–100.0)
Platelets: 182 10*3/uL (ref 150–400)
RBC: 3.95 MIL/uL — ABNORMAL LOW (ref 4.22–5.81)
RDW: 14.5 % (ref 11.5–15.5)
WBC: 3.6 10*3/uL — ABNORMAL LOW (ref 4.0–10.5)
nRBC: 0 % (ref 0.0–0.2)

## 2021-07-17 LAB — BASIC METABOLIC PANEL
Anion gap: 8 (ref 5–15)
BUN: 34 mg/dL — ABNORMAL HIGH (ref 8–23)
CO2: 21 mmol/L — ABNORMAL LOW (ref 22–32)
Calcium: 9.4 mg/dL (ref 8.9–10.3)
Chloride: 101 mmol/L (ref 98–111)
Creatinine, Ser: 1.84 mg/dL — ABNORMAL HIGH (ref 0.61–1.24)
GFR, Estimated: 36 mL/min — ABNORMAL LOW (ref >60)
Glucose, Bld: 316 mg/dL — ABNORMAL HIGH (ref 70–99)
Potassium: 4.7 mmol/L (ref 3.5–5.1)
Sodium: 130 mmol/L — ABNORMAL LOW (ref 135–145)

## 2021-07-17 LAB — GLUCOSE, CAPILLARY
Glucose-Capillary: 312 mg/dL — ABNORMAL HIGH (ref 70–99)
Glucose-Capillary: 287 mg/dL — ABNORMAL HIGH (ref 70–99)

## 2021-07-17 LAB — PROTIME-INR
INR: 1.1 (ref 0.8–1.2)
Prothrombin Time: 14.4 seconds (ref 11.4–15.2)

## 2021-07-17 IMAGING — XA IR ANGIO INTRA EXTRACRAN SEL COM CAROTID INNOMINATE BILAT MOD SE
1 of 2 series · 12 of 24 positions shown · IV contrast (IODINE)
Comparison: MRI MRA of the brain [DATE].

CLINICAL DATA: Symptoms of disabling vertebrobasilar ischemia.
Suspected occluded both vertebral arteries at the skull base on MRA
of the brain.

EXAM:
BILATERAL COMMON CAROTID AND INNOMINATE ANGIOGRAPHY
TECHNIQUE: Informed written consent was obtained from the patient after a
thorough discussion of the procedural risks, benefits and
alternatives. All questions were addressed. Maximal Sterile Barrier
Technique was utilized including caps, mask, sterile gowns, sterile
gloves, sterile drape, hand hygiene and skin antiseptic. A timeout
was performed prior to the initiation of the procedure.

[Series 300: ir angio intra extracran sel com carotid · 12 of 170 slices shown]
[im 1/170]
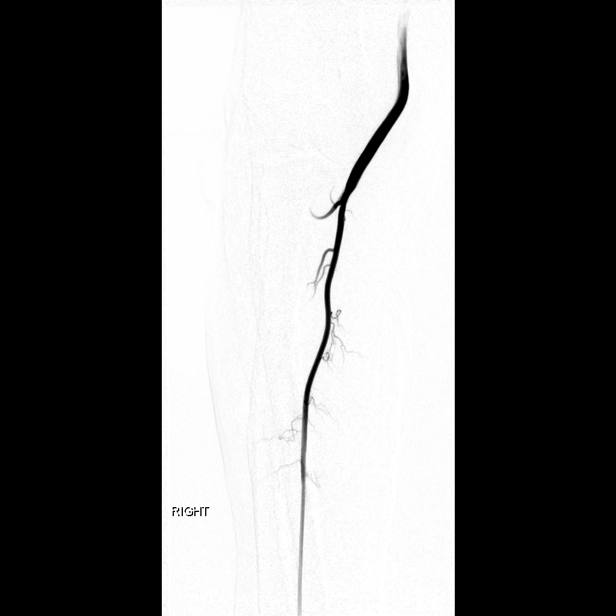
[im 16/170]
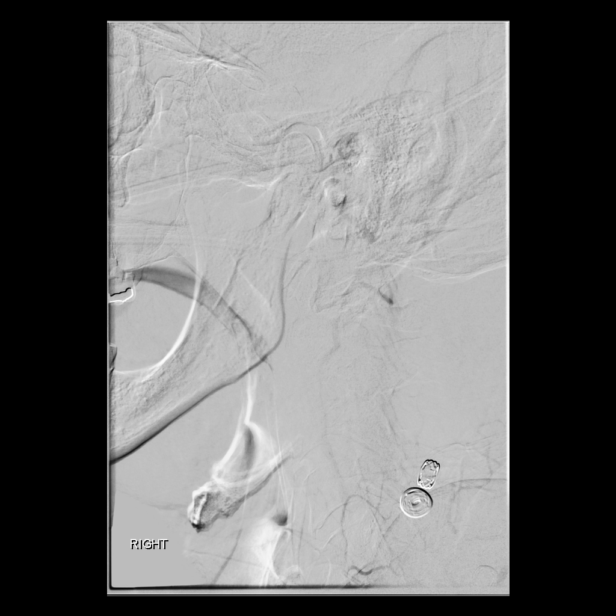
[im 31/170]
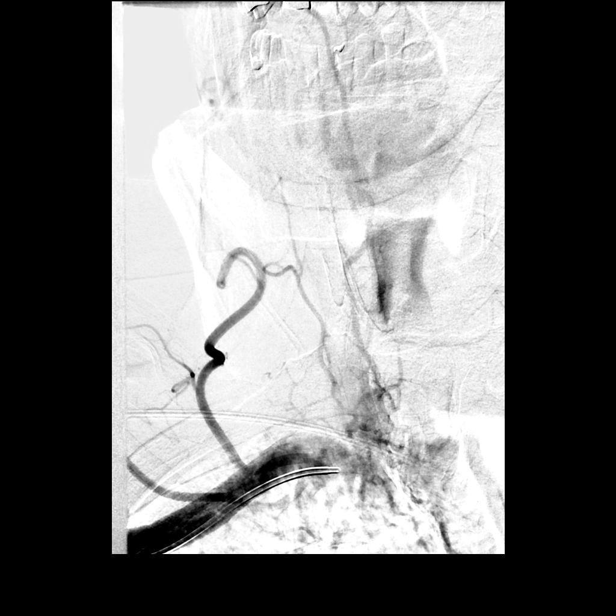
[im 47/170]
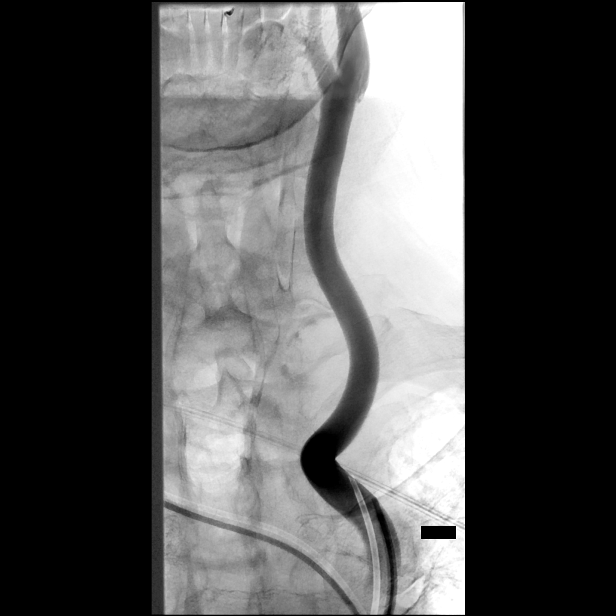
[im 62/170]
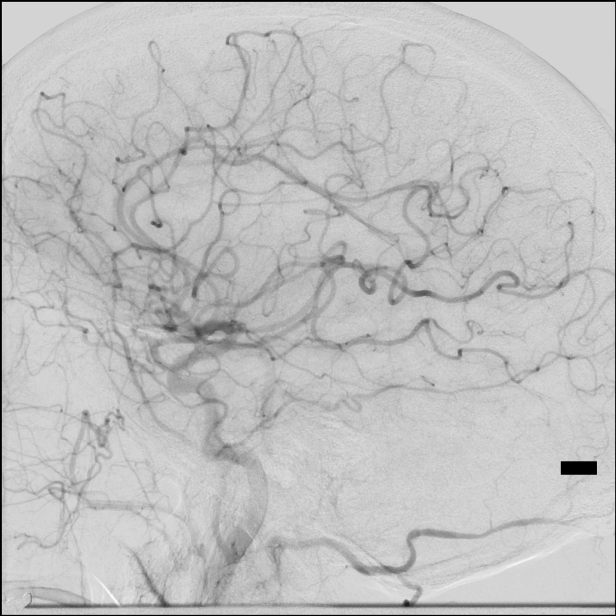
[im 77/170]
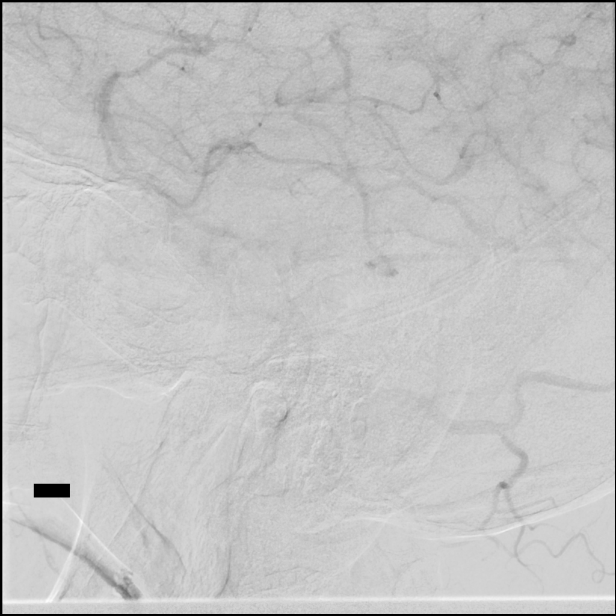
[im 93/170]
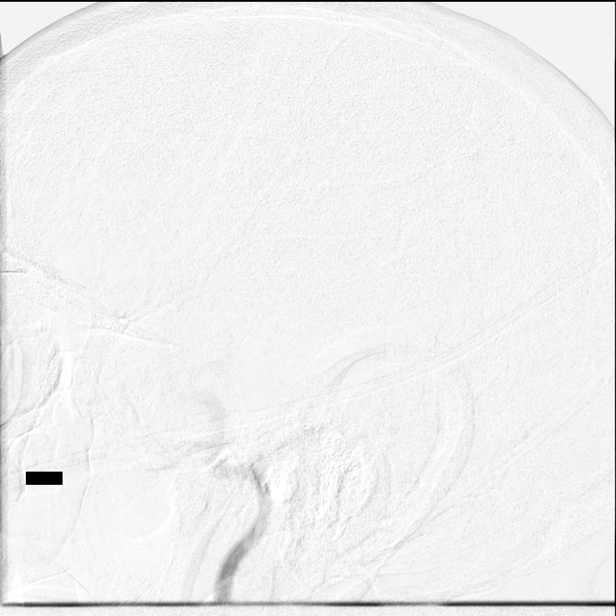
[im 108/170]
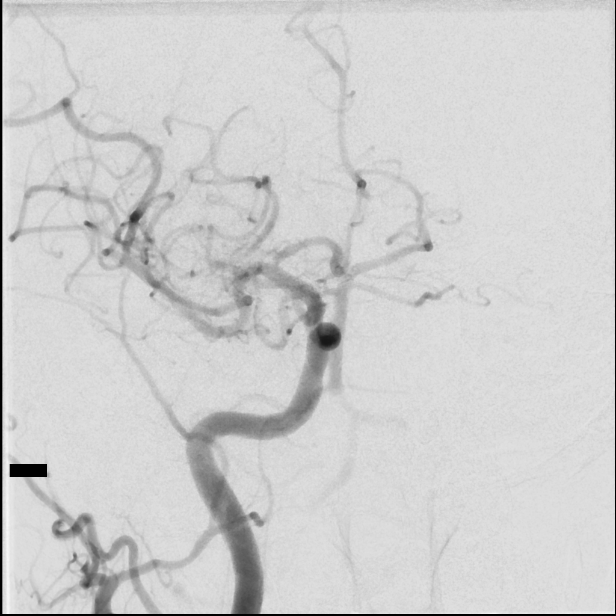
[im 123/170]
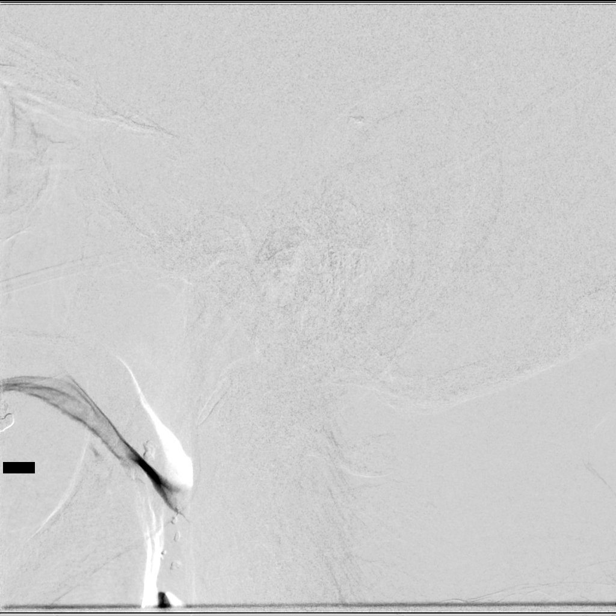
[im 139/170]
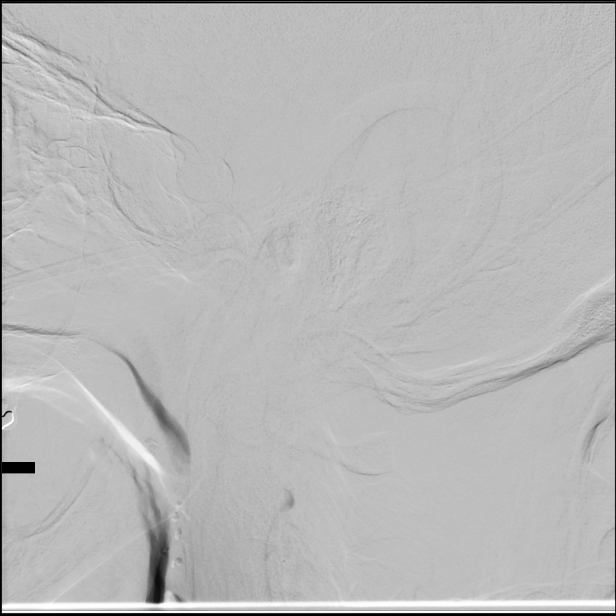
[im 154/170]
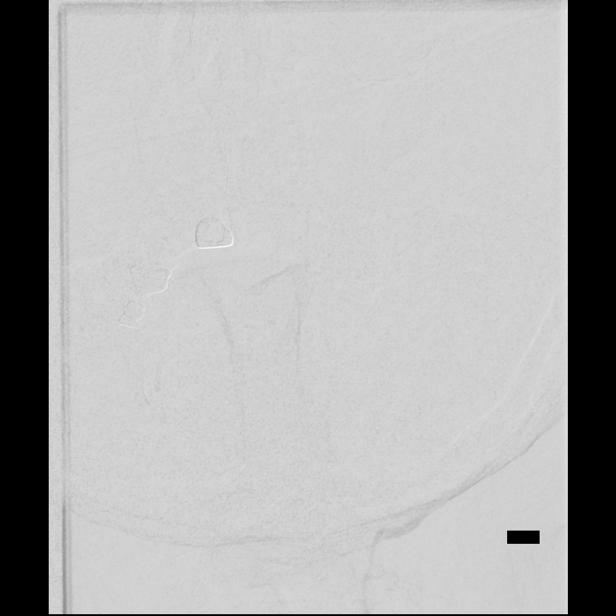
[im 170/170]
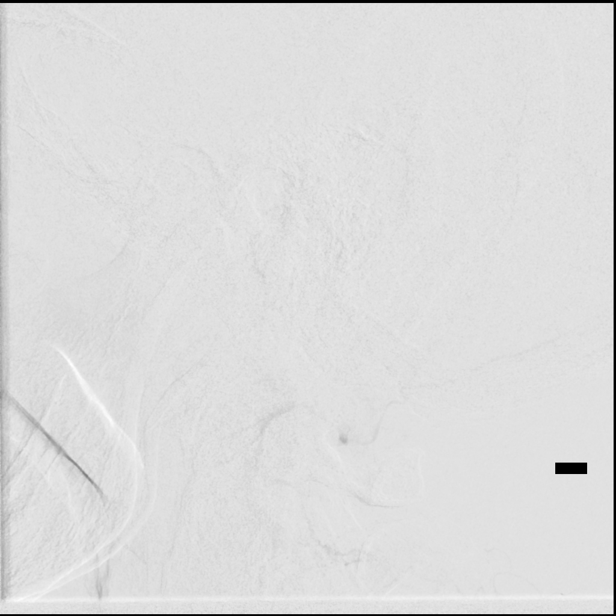

[12 of 24 positions shown; findings below may reference images not displayed]

MEDICATIONS:
Heparin [9L] units IA. No antibiotic was administered within 1 hour
of the procedure.

ANESTHESIA/SEDATION:
Versed 1 mg IV; Fentanyl 25 mcg IV

Moderate Sedation Time:  46 minutes

The patient was continuously monitored during the procedure by the
interventional radiology nurse under my direct supervision.

CONTRAST:  Omnipaque 240 80 mL.

FLUOROSCOPY TIME:  Fluoroscopy Time: 8 minutes 24 seconds ([9L]
mGy).

COMPLICATIONS:
None immediate.
The right forearm to the wrist was prepped and draped in the usual
sterile manner. The right radial artery was identified with
ultrasound, and its morphology documented by ultrasound. A dorsal
palmar anastomosis was verified to be present.

Using ultrasound guidance and a micropuncture set access into the
right radial artery was obtained over a 0.018 inch micro guidewire.

A [DATE] French radial sheath was inserted over the micro guidewire.
The obturator, and micro guidewire were removed. Good aspiration was
obtained from the side port of the sheath. Cocktail of [9L] units of
heparin, 2.5 mg of verapamil, and 200 mcg of nitroglycerin was then
infused in diluted form through the sheath without event. A right
radial arteriogram was performed.

Over a 0.035 inch regular Glidewire, [place] 2 5 French diagnostic
catheter was advanced to the aortic arch region, and positioned at
the origin of the right vertebral artery, the right common carotid
artery, the left common carotid artery and the left subclavian
artery.

A wrist band was applied at the right radial puncture site for
hemostasis. The distal right radial pulse was verified to be
present.
FINDINGS: The origin of the right vertebral artery appears mildly narrowed
with mild tortuosity.

The vessel is seen to opacify to the cranial skull base were a
disuse segment of severe stenosis is seen at the level of the
occipital bone. Distal to this the vessel assumes normal caliber
supplying the right posterior-inferior cerebellar artery. Complete
occlusion is seen of the right vertebrobasilar junction just distal
to the origin of the right posterior-inferior cerebellar artery.

The left common carotid arteriogram demonstrates the left external
carotid artery and its major branches to be widely patent.

The left internal carotid artery at the bulb to the cranial skull
base is widely patent.

The petrous, the cavernous and the supraclinoid segments are widely
patent.

The left middle cerebral artery and the left anterior cerebral
artery opacify into the capillary and venous phases.

Prompt cross filling via the anterior communicating artery of the
right anterior cerebral A2 segment and distally is noted. Also noted
is a diminutive right posterior communicating artery slowly
opacifying the distal basilar artery to the level of the distal left
vertebrobasilar junction. The right common carotid arteriogram
demonstrates the right external carotid artery and its major
branches to be widely patent.

The right internal carotid artery at the bulb to the cranial skull
base is widely patent.

The petrous, the cavernous and the supraclinoid segments are widely
patent.

There is a prominent right posterior communicating artery opacifying
the right posterior cerebral arteries bilaterally.

Also demonstrated is retrograde opacification of the basilar artery
to the distal left vertebrobasilar junctions bilaterally. The right
middle cerebral artery opacifies into the capillary and venous
phases.

The right anterior cerebral artery demonstrates a moderate stenosis
at its origin, with distal opacification of the right anterior
cerebral artery.

The left subclavian arteriogram demonstrates a more dominant left
vertebral artery at its origin with slow ascent of contrast to the
cranial skull base to the level of the occipital bone, associated
with a moderate severe stenosis. Opacification of a diminutive left
posterior meningeal artery is demonstrated.

No distal left vertebrobasilar junction is opacified.
IMPRESSION: Occluded non dominant right vertebrobasilar junction distal to the
right posterior-inferior cerebellar artery, associated with severe
segmental stenosis of the right vertebrobasilar junction proximal to
this.

Occluded dominant left vertebrobasilar junction at the level of C1
vertebral body.

Retrograde opacification of vertebrobasilar junctions bilaterally
from the internal carotid arteries via the posterior communicating
arteries right greater than left, to the level of the occluded
vertebrobasilar junctions bilaterally.

PLAN:
Findings reviewed with patient and spouse. Follow-up in the clinic
in the next week to discuss angiographic findings, and management
considerations.

## 2021-07-17 MED ORDER — LIDOCAINE HCL (PF) 1 % IJ SOLN
INTRAMUSCULAR | Status: AC | PRN
Start: 2021-07-17 — End: 2021-07-17
  Administered 2021-07-17: 2 mL

## 2021-07-17 MED ORDER — NITROGLYCERIN 1 MG/10 ML FOR IR/CATH LAB
INTRA_ARTERIAL | Status: AC
  Filled 2021-07-17: qty 10

## 2021-07-17 MED ORDER — LIDOCAINE HCL 1 % IJ SOLN
INTRAMUSCULAR | Status: AC
  Filled 2021-07-17: qty 20

## 2021-07-17 MED ORDER — VERAPAMIL HCL 2.5 MG/ML IV SOLN
INTRAVENOUS | Status: AC
  Filled 2021-07-17: qty 2

## 2021-07-17 MED ORDER — ACETAMINOPHEN 325 MG PO TABS
650.0000 mg | ORAL_TABLET | Freq: Once | ORAL | Status: AC
  Administered 2021-07-17: 650 mg via ORAL

## 2021-07-17 MED ORDER — MIDAZOLAM HCL 2 MG/2ML IJ SOLN
INTRAMUSCULAR | Status: AC
  Filled 2021-07-17: qty 2

## 2021-07-17 MED ORDER — MIDAZOLAM HCL 2 MG/2ML IJ SOLN
INTRAMUSCULAR | Status: AC | PRN
  Administered 2021-07-17: 1 mg via INTRAVENOUS

## 2021-07-17 MED ORDER — IOHEXOL 240 MG/ML SOLN
50.0000 mL | Freq: Once | INTRAMUSCULAR | Status: AC | PRN
  Administered 2021-07-17: 29 mL via INTRAVENOUS

## 2021-07-17 MED ORDER — SODIUM CHLORIDE (PF) 0.9 % IJ SOLN
INTRAVENOUS | Status: AC | PRN
  Administered 2021-07-17: 200 ug via INTRA_ARTERIAL

## 2021-07-17 MED ORDER — VERAPAMIL HCL 2.5 MG/ML IV SOLN: INTRA_ARTERIAL | Status: AC | PRN

## 2021-07-17 MED ORDER — SODIUM CHLORIDE 0.9 % IV SOLN: INTRAVENOUS | Status: AC

## 2021-07-17 MED ORDER — HEPARIN SODIUM (PORCINE) 1000 UNIT/ML IJ SOLN
INTRAMUSCULAR | Status: AC
  Filled 2021-07-17: qty 1

## 2021-07-17 MED ORDER — SODIUM CHLORIDE 0.9 % IV SOLN: Freq: Once | INTRAVENOUS | Status: AC

## 2021-07-17 MED ORDER — FENTANYL CITRATE (PF) 100 MCG/2ML IJ SOLN
INTRAMUSCULAR | Status: AC | PRN
  Administered 2021-07-17: 25 ug via INTRAVENOUS

## 2021-07-17 MED ORDER — ACETAMINOPHEN 325 MG PO TABS
ORAL_TABLET | ORAL | Status: AC
  Filled 2021-07-17: qty 2

## 2021-07-17 MED ORDER — FENTANYL CITRATE (PF) 100 MCG/2ML IJ SOLN
INTRAMUSCULAR | Status: AC
  Filled 2021-07-17: qty 2

## 2021-07-17 MED ORDER — HYDRALAZINE HCL 20 MG/ML IJ SOLN
INTRAMUSCULAR | Status: AC
  Filled 2021-07-17: qty 1

## 2021-07-17 NOTE — Sedation Documentation (Signed)
Dr. Estanislado Pandy at bedside to discuss case with pt.

## 2021-07-17 NOTE — Progress Notes (Signed)
Pt ambulated without difficulty or bleeding.   Discharged home with his wife who will drive and stay with pt x 24 hrs. 

## 2021-07-17 NOTE — Progress Notes (Signed)
Pt reports he took prednisone 13 hours before procedure , 7 hours before procedure and  will take 1 hour before procedure   Reports he took benadryl 7 hours before procedure and will take 1 hour before procedure

## 2021-07-17 NOTE — Procedures (Signed)
S/P 4 vessel cerebra arteriogram. RT Rad approach. Findings. 1.Bilaterally occluded VAs at the VBJs distal to PICAs. 2.Retrograde filling of basilar artery via  Rt PCOM  to the Lt PICA.  S.Vennesa Bastedo MD

## 2021-07-17 NOTE — Sedation Documentation (Signed)
Dr. Estanislado Pandy reviewing images and plan of care with pt and wife at bedside.
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
## 2021-07-18 ENCOUNTER — Other Ambulatory Visit: Payer: Self-pay | Admitting: Family Medicine

## 2021-07-18 ENCOUNTER — Telehealth: Payer: Self-pay

## 2021-07-18 DIAGNOSIS — E118 Type 2 diabetes mellitus with unspecified complications: Secondary | ICD-10-CM

## 2021-07-18 DIAGNOSIS — D6859 Other primary thrombophilia: Secondary | ICD-10-CM

## 2021-07-18 DIAGNOSIS — Z01818 Encounter for other preprocedural examination: Secondary | ICD-10-CM

## 2021-07-18 DIAGNOSIS — N183 Chronic kidney disease, stage 3 unspecified: Secondary | ICD-10-CM

## 2021-07-18 NOTE — Telephone Encounter (Signed)
Patient wife (DPR) called to schedule appt for labs only for patient.  Stated that Dr. Anitra Lauth told them that patient could have labs drawn here and then sent to Dr. Estanislado Pandy .  I could not find any orders for labs.  Patient scheduled for tomorrow 9/1

## 2021-07-18 NOTE — Telephone Encounter (Signed)
Unable to find anything from 8/18 OV note or labs stating the following below.   Please review and advise

## 2021-07-18 NOTE — Telephone Encounter (Signed)
Spoke with pt's wife, Stanton Kidney to confirm appt and ok for labs.

## 2021-07-18 NOTE — Telephone Encounter (Signed)
Ok orders are in

## 2021-07-19 ENCOUNTER — Ambulatory Visit (INDEPENDENT_AMBULATORY_CARE_PROVIDER_SITE_OTHER): Payer: Medicare Other

## 2021-07-19 ENCOUNTER — Other Ambulatory Visit: Payer: Self-pay

## 2021-07-19 ENCOUNTER — Other Ambulatory Visit (HOSPITAL_COMMUNITY): Payer: Self-pay | Admitting: Interventional Radiology

## 2021-07-19 DIAGNOSIS — N183 Chronic kidney disease, stage 3 unspecified: Secondary | ICD-10-CM

## 2021-07-19 DIAGNOSIS — Z01818 Encounter for other preprocedural examination: Secondary | ICD-10-CM

## 2021-07-19 DIAGNOSIS — D6859 Other primary thrombophilia: Secondary | ICD-10-CM

## 2021-07-19 DIAGNOSIS — H93A9 Pulsatile tinnitus, unspecified ear: Secondary | ICD-10-CM

## 2021-07-19 DIAGNOSIS — E118 Type 2 diabetes mellitus with unspecified complications: Secondary | ICD-10-CM | POA: Diagnosis not present

## 2021-07-19 LAB — CBC WITH DIFFERENTIAL/PLATELET
Basophils Absolute: 0 10*3/uL (ref 0.0–0.1)
Basophils Relative: 0.4 % (ref 0.0–3.0)
Eosinophils Absolute: 0 10*3/uL (ref 0.0–0.7)
Eosinophils Relative: 0.4 % (ref 0.0–5.0)
HCT: 42 % (ref 39.0–52.0)
Hemoglobin: 13.3 g/dL (ref 13.0–17.0)
Lymphocytes Relative: 14.3 % (ref 12.0–46.0)
Lymphs Abs: 1.2 10*3/uL (ref 0.7–4.0)
MCHC: 31.7 g/dL (ref 30.0–36.0)
MCV: 98.9 fl (ref 78.0–100.0)
Monocytes Absolute: 1.2 10*3/uL — ABNORMAL HIGH (ref 0.1–1.0)
Monocytes Relative: 15.3 % — ABNORMAL HIGH (ref 3.0–12.0)
Neutro Abs: 5.6 10*3/uL (ref 1.4–7.7)
Neutrophils Relative %: 69.6 % (ref 43.0–77.0)
Platelets: 236 10*3/uL (ref 150.0–400.0)
RBC: 4.24 Mil/uL (ref 4.22–5.81)
RDW: 16.2 % — ABNORMAL HIGH (ref 11.5–15.5)
WBC: 8.1 10*3/uL (ref 4.0–10.5)

## 2021-07-19 LAB — BASIC METABOLIC PANEL
BUN: 43 mg/dL — ABNORMAL HIGH (ref 6–23)
CO2: 26 mEq/L (ref 19–32)
Calcium: 9.4 mg/dL (ref 8.4–10.5)
Chloride: 100 mEq/L (ref 96–112)
Creatinine, Ser: 2.06 mg/dL — ABNORMAL HIGH (ref 0.40–1.50)
GFR: 29.55 mL/min — ABNORMAL LOW (ref >60.00)
Glucose, Bld: 206 mg/dL — ABNORMAL HIGH (ref 70–99)
Potassium: 5.1 mEq/L (ref 3.5–5.1)
Sodium: 136 mEq/L (ref 135–145)

## 2021-07-19 LAB — PROTIME-INR
INR: 1.1 ratio — ABNORMAL HIGH (ref 0.8–1.0)
Prothrombin Time: 11.6 s (ref 9.6–13.1)

## 2021-07-19 MED FILL — Lidocaine HCl Local Preservative Free (PF) Inj 1%: INTRAMUSCULAR | Qty: 2 | Status: AC

## 2021-07-24 ENCOUNTER — Other Ambulatory Visit: Payer: Self-pay

## 2021-07-24 ENCOUNTER — Ambulatory Visit (HOSPITAL_COMMUNITY)
Admission: RE | Admit: 2021-07-24 | Discharge: 2021-07-24 | Disposition: A | Discharge status: Home / Self Care | Payer: Medicare Other | Source: Ambulatory Visit | Attending: Interventional Radiology | Admitting: Interventional Radiology

## 2021-07-24 DIAGNOSIS — H93A9 Pulsatile tinnitus, unspecified ear: Secondary | ICD-10-CM

## 2021-07-25 HISTORY — PX: IR RADIOLOGIST EVAL & MGMT: IMG5224

## 2021-08-08 ENCOUNTER — Telehealth: Payer: Self-pay

## 2021-08-08 NOTE — Telephone Encounter (Signed)
New message   Fax referral with office notes to Dr. Deveshwar's office [phone]- [phone]. 

## 2021-08-09 ENCOUNTER — Other Ambulatory Visit: Payer: Self-pay | Admitting: Family Medicine

## 2021-08-10 ENCOUNTER — Other Ambulatory Visit (HOSPITAL_COMMUNITY): Payer: Self-pay | Admitting: Interventional Radiology

## 2021-08-10 ENCOUNTER — Other Ambulatory Visit (HOSPITAL_COMMUNITY): Payer: Self-pay | Admitting: Physician Assistant

## 2021-08-10 DIAGNOSIS — I771 Stricture of artery: Secondary | ICD-10-CM

## 2021-08-14 ENCOUNTER — Other Ambulatory Visit: Payer: Self-pay | Admitting: Radiology

## 2021-08-14 MED ORDER — CLOPIDOGREL BISULFATE 75 MG PO TABS: 75.0000 mg | ORAL_TABLET | Freq: Every day | ORAL | 3 refills | Status: DC

## 2021-08-15 ENCOUNTER — Telehealth: Payer: Self-pay | Admitting: Family Medicine

## 2021-08-15 NOTE — Telephone Encounter (Signed)
Called patient to schedule AWV, wife stated that he was unavailable and she would have hi call the office back at a later time to schedule his AWV.

## 2021-08-17 ENCOUNTER — Other Ambulatory Visit: Payer: Self-pay | Admitting: Student

## 2021-08-17 DIAGNOSIS — I679 Cerebrovascular disease, unspecified: Secondary | ICD-10-CM

## 2021-08-17 MED ORDER — PREDNISONE 50 MG PO TABS: ORAL_TABLET | ORAL | 0 refills | Status: DC

## 2021-08-17 NOTE — Progress Notes (Signed)
Patient has prescription for Plavix which he started 9/29.  He plans to come 10/7 for P2Y12 lab draw and COVID test.  Also called in prescription for prednisone 50 mg tablets x3 to be taken 13 hrs, 7hrs, and 1 hr prior to procedure, as well as benadryl 50mg  1 hr prior to procedure.  Spoke with wife over the phone to discuss the above who confirms understanding.   Brynda Greathouse, MS RD PA-C

## 2021-08-24 ENCOUNTER — Other Ambulatory Visit (HOSPITAL_COMMUNITY)
Admission: RE | Admit: 2021-08-24 | Discharge: 2021-08-24 | Disposition: A | Discharge status: Home / Self Care | Payer: Medicare Other | Source: Ambulatory Visit | Attending: Interventional Radiology | Admitting: Interventional Radiology

## 2021-08-24 ENCOUNTER — Encounter (HOSPITAL_COMMUNITY): Payer: Self-pay | Admitting: Interventional Radiology

## 2021-08-24 ENCOUNTER — Other Ambulatory Visit (HOSPITAL_COMMUNITY): Payer: Self-pay | Admitting: Radiology

## 2021-08-24 ENCOUNTER — Other Ambulatory Visit (HOSPITAL_COMMUNITY): Payer: Self-pay | Admitting: Interventional Radiology

## 2021-08-24 ENCOUNTER — Other Ambulatory Visit: Payer: Self-pay

## 2021-08-24 ENCOUNTER — Other Ambulatory Visit: Payer: Self-pay | Admitting: Physician Assistant

## 2021-08-24 DIAGNOSIS — H93A9 Pulsatile tinnitus, unspecified ear: Secondary | ICD-10-CM

## 2021-08-24 LAB — SARS CORONAVIRUS 2 (TAT 6-24 HRS): SARS Coronavirus 2: NEGATIVE

## 2021-08-24 NOTE — Progress Notes (Addendum)
PCP is Dr. Ricardo Jericho. Nephrologist is Dr. Justin Mend. Neurologist is Dr. Lind Guest.  Last Hemoglobin A1C was drawn on 818/22 it was 6.8. Mr. Westrup asked that I speak to his wife, Stanton Kidney, we have signed Designated Party release to speak with [facility].   Stanton Kidney states that hard time hearing on the phone.  Stanton Kidney states that patient has not had any chesty pain or shortness of breath.Stanton Kidney denies having any s/s of Covid in her household and denies any known exposure to Covid.   Mr Cho has  type II diabetes, Stanton Kidney reports that CBGs are running around 100.  I instructed mary to have patient take 1/2 of Lantus Sunday bedtime, if CBG is > 70 on Monday am take 19 units of Lantus. I asked that Mr. Tomasello check CBG after awaking and every 2 hours until arrival  to the hospital.  I Instructed patient if CBG is less than 70 to take 4 Glucose Tablets or 1 tube of Glucose Gel or 1/2 cup of a clear juice. Recheck CBG in 15 minutes if CBG is not over 70 call, pre- op desk at [phone] for further instructions. If scheduled to receive Insulin, do not take Insulin   I instructed patient to shower with antibiotic soap, if it is available.  Dry off with a clean towel. Do not put lotion, powder, cologne or deodorant or makeup.No jewelry or piercings. Men may shave their face and neck. Woman should not shave. No nail polish, artificial or acrylic nails. Wear clean clothes, brush your teeth. Glasses, contact lens,dentures or partials may not be worn in the OR. If you need to wear them, please bring a case for glasses, do not wear contacts or bring a case, the hospital does not have contact cases, dentures or partials will have to be removed , make sure they are clean, we will provide a denture cup to put them in. You will need some one to drive you home and a responsible person over the age of 41 to stay with you for the first 24 hours after surgery.

## 2021-08-27 ENCOUNTER — Ambulatory Visit (HOSPITAL_COMMUNITY)
Admission: RE | Admit: 2021-08-27 | Discharge: 2021-08-27 | Disposition: A | Discharge status: Home / Self Care | Payer: Medicare Other | Source: Ambulatory Visit | Attending: Interventional Radiology | Admitting: Interventional Radiology

## 2021-08-27 ENCOUNTER — Encounter (HOSPITAL_COMMUNITY): Payer: Self-pay | Admitting: Interventional Radiology

## 2021-08-27 ENCOUNTER — Ambulatory Visit (HOSPITAL_COMMUNITY): Payer: Medicare Other | Admitting: Certified Registered Nurse Anesthetist

## 2021-08-27 ENCOUNTER — Inpatient Hospital Stay (HOSPITAL_COMMUNITY)
Admission: RE | Admit: 2021-08-27 | Discharge: 2021-08-28 | DRG: 039 | Disposition: A | Discharge status: Home / Self Care | Payer: Medicare Other | Attending: Interventional Radiology | Admitting: Interventional Radiology

## 2021-08-27 ENCOUNTER — Encounter (HOSPITAL_COMMUNITY)
Admission: RE | Disposition: A | Discharge status: Home / Self Care | Payer: Self-pay | Source: Home / Self Care | Attending: Interventional Radiology

## 2021-08-27 ENCOUNTER — Encounter (HOSPITAL_COMMUNITY): Payer: Self-pay

## 2021-08-27 ENCOUNTER — Other Ambulatory Visit: Payer: Self-pay

## 2021-08-27 DIAGNOSIS — Z833 Family history of diabetes mellitus: Secondary | ICD-10-CM | POA: Diagnosis not present

## 2021-08-27 DIAGNOSIS — H9312 Tinnitus, left ear: Secondary | ICD-10-CM | POA: Diagnosis present

## 2021-08-27 DIAGNOSIS — Z794 Long term (current) use of insulin: Secondary | ICD-10-CM | POA: Diagnosis not present

## 2021-08-27 DIAGNOSIS — Z79899 Other long term (current) drug therapy: Secondary | ICD-10-CM | POA: Diagnosis not present

## 2021-08-27 DIAGNOSIS — Z96611 Presence of right artificial shoulder joint: Secondary | ICD-10-CM | POA: Diagnosis present

## 2021-08-27 DIAGNOSIS — E1165 Type 2 diabetes mellitus with hyperglycemia: Secondary | ICD-10-CM | POA: Diagnosis present

## 2021-08-27 DIAGNOSIS — N400 Enlarged prostate without lower urinary tract symptoms: Secondary | ICD-10-CM | POA: Diagnosis present

## 2021-08-27 DIAGNOSIS — Z7902 Long term (current) use of antithrombotics/antiplatelets: Secondary | ICD-10-CM

## 2021-08-27 DIAGNOSIS — Z880 Allergy status to penicillin: Secondary | ICD-10-CM | POA: Diagnosis not present

## 2021-08-27 DIAGNOSIS — Z87891 Personal history of nicotine dependence: Secondary | ICD-10-CM | POA: Diagnosis not present

## 2021-08-27 DIAGNOSIS — R279 Unspecified lack of coordination: Secondary | ICD-10-CM | POA: Diagnosis not present

## 2021-08-27 DIAGNOSIS — Z8249 Family history of ischemic heart disease and other diseases of the circulatory system: Secondary | ICD-10-CM

## 2021-08-27 DIAGNOSIS — Z8261 Family history of arthritis: Secondary | ICD-10-CM | POA: Diagnosis not present

## 2021-08-27 DIAGNOSIS — M109 Gout, unspecified: Secondary | ICD-10-CM | POA: Diagnosis present

## 2021-08-27 DIAGNOSIS — Z87442 Personal history of urinary calculi: Secondary | ICD-10-CM | POA: Diagnosis not present

## 2021-08-27 DIAGNOSIS — E782 Mixed hyperlipidemia: Secondary | ICD-10-CM | POA: Diagnosis present

## 2021-08-27 DIAGNOSIS — G450 Vertebro-basilar artery syndrome: Principal | ICD-10-CM | POA: Diagnosis present

## 2021-08-27 DIAGNOSIS — R2689 Other abnormalities of gait and mobility: Secondary | ICD-10-CM | POA: Diagnosis not present

## 2021-08-27 DIAGNOSIS — I771 Stricture of artery: Secondary | ICD-10-CM

## 2021-08-27 DIAGNOSIS — I10 Essential (primary) hypertension: Secondary | ICD-10-CM | POA: Diagnosis present

## 2021-08-27 DIAGNOSIS — Z823 Family history of stroke: Secondary | ICD-10-CM | POA: Diagnosis not present

## 2021-08-27 DIAGNOSIS — Z91041 Radiographic dye allergy status: Secondary | ICD-10-CM

## 2021-08-27 HISTORY — PX: IR INTRA CRAN STENT: IMG2345

## 2021-08-27 HISTORY — PX: IR CT HEAD LTD: IMG2386

## 2021-08-27 HISTORY — PX: RADIOLOGY WITH ANESTHESIA: SHX6223

## 2021-08-27 HISTORY — DX: Vertebro-basilar artery syndrome: G45.0

## 2021-08-27 HISTORY — PX: IR ANGIO VERTEBRAL SEL VERTEBRAL UNI L MOD SED: IMG5367

## 2021-08-27 HISTORY — DX: Personal history of urinary calculi: Z87.442

## 2021-08-27 LAB — GLUCOSE, CAPILLARY
Glucose-Capillary: 287 mg/dL — ABNORMAL HIGH (ref 70–99)
Glucose-Capillary: 270 mg/dL — ABNORMAL HIGH (ref 70–99)
Glucose-Capillary: 261 mg/dL — ABNORMAL HIGH (ref 70–99)
Glucose-Capillary: 222 mg/dL — ABNORMAL HIGH (ref 70–99)
Glucose-Capillary: 201 mg/dL — ABNORMAL HIGH (ref 70–99)
Glucose-Capillary: 164 mg/dL — ABNORMAL HIGH (ref 70–99)
Glucose-Capillary: 281 mg/dL — ABNORMAL HIGH (ref 70–99)

## 2021-08-27 LAB — URINALYSIS, COMPLETE (UACMP) WITH MICROSCOPIC
Bacteria, UA: NONE SEEN
Bilirubin Urine: NEGATIVE
Glucose, UA: 50 mg/dL — AB
Hgb urine dipstick: NEGATIVE
Ketones, ur: NEGATIVE mg/dL
Leukocytes,Ua: NEGATIVE
Nitrite: NEGATIVE
Protein, ur: NEGATIVE mg/dL
Specific Gravity, Urine: 1.016 (ref 1.005–1.030)
pH: 5 (ref 5.0–8.0)

## 2021-08-27 LAB — CBC
HCT: 35.9 % — ABNORMAL LOW (ref 39.0–52.0)
Hemoglobin: 11.2 g/dL — ABNORMAL LOW (ref 13.0–17.0)
MCH: 31 pg (ref 26.0–34.0)
MCHC: 31.2 g/dL (ref 30.0–36.0)
MCV: 99.4 fL (ref 80.0–100.0)
Platelets: 170 10*3/uL (ref 150–400)
RBC: 3.61 MIL/uL — ABNORMAL LOW (ref 4.22–5.81)
RDW: 14.6 % (ref 11.5–15.5)
WBC: 2.5 10*3/uL — ABNORMAL LOW (ref 4.0–10.5)
nRBC: 0 % (ref 0.0–0.2)

## 2021-08-27 LAB — BASIC METABOLIC PANEL
Anion gap: 9 (ref 5–15)
BUN: 31 mg/dL — ABNORMAL HIGH (ref 8–23)
CO2: 20 mmol/L — ABNORMAL LOW (ref 22–32)
Calcium: 9.2 mg/dL (ref 8.9–10.3)
Chloride: 104 mmol/L (ref 98–111)
Creatinine, Ser: 1.85 mg/dL — ABNORMAL HIGH (ref 0.61–1.24)
GFR, Estimated: 36 mL/min — ABNORMAL LOW (ref >60)
Glucose, Bld: 306 mg/dL — ABNORMAL HIGH (ref 70–99)
Potassium: 4.3 mmol/L (ref 3.5–5.1)
Sodium: 133 mmol/L — ABNORMAL LOW (ref 135–145)

## 2021-08-27 LAB — PROTIME-INR
INR: 1.1 (ref 0.8–1.2)
Prothrombin Time: 14.6 seconds (ref 11.4–15.2)

## 2021-08-27 LAB — POCT ACTIVATED CLOTTING TIME
Activated Clotting Time: 213 seconds
Activated Clotting Time: 225 seconds

## 2021-08-27 LAB — MRSA NEXT GEN BY PCR, NASAL: MRSA by PCR Next Gen: NOT DETECTED

## 2021-08-27 LAB — HEPARIN LEVEL (UNFRACTIONATED): Heparin Unfractionated: <0.1 IU/mL — ABNORMAL LOW (ref 0.30–0.70)

## 2021-08-27 IMAGING — XA IR INTRACRANIAL STENT (INCL PTA)
2 of 3 series · 7 of 24 positions shown · non-contrast
Comparison: Recent catheter arteriogram [DATE].

INDICATION: History of severe vertebrobasilar ischemic symptoms associated with
incoordination and gait instability due to occluded right vertebral
artery at the skull base and occluded dominant left vertebrobasilar
junction just proximal to the left posterior-inferior cerebellar
artery.

EXAM:
ENDOVASCULAR REVASCULARIZATION OF OCCLUDED DOMINANT LEFT
VERTEBROBASILAR JUNCTION, AND OF THE PRE OCCLUSIVE STENOSIS OF THE
LEFT VERTEBRAL ARTERY AT THE LEVEL OF C1 WITH STENT ASSISTED
ANGIOPLASTY.
TECHNIQUE: Informed written consent was obtained from the patient after a
thorough discussion of the procedural risks, benefits and
alternatives. All questions were addressed. Maximal Sterile Barrier
Technique was utilized including caps, mask, sterile gowns, sterile
gloves, sterile drape, hand hygiene and skin antiseptic. A timeout
was performed prior to the initiation of the procedure.

[Series 28: <mpr range>2 · coronal · 5.0mm · 0.47mm/px · 1 of 35 slices shown]
[im 35/35]
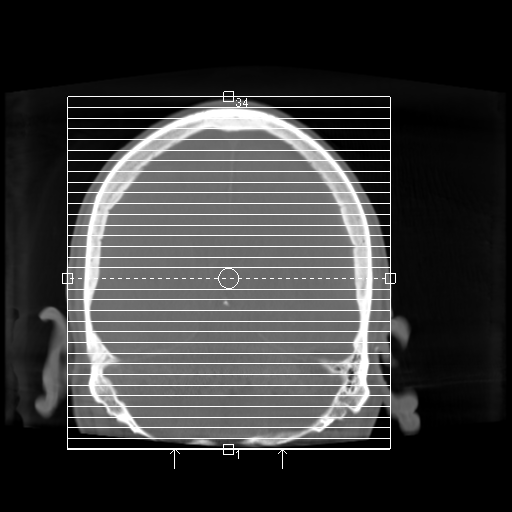

[Series 300: dr. (person_name) · 6 of 272 slices shown]
[im 1/272]
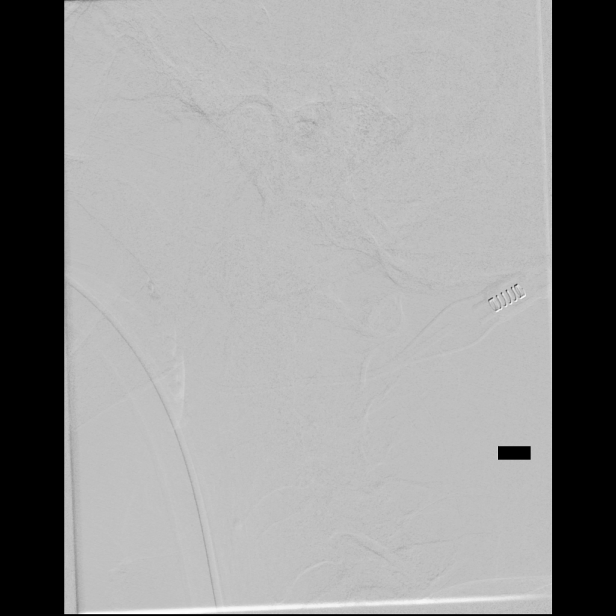
[im 46/272]
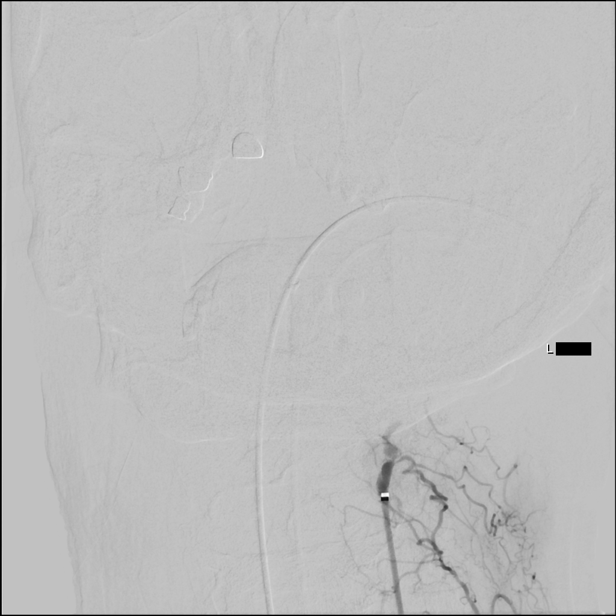
[im 106/272]
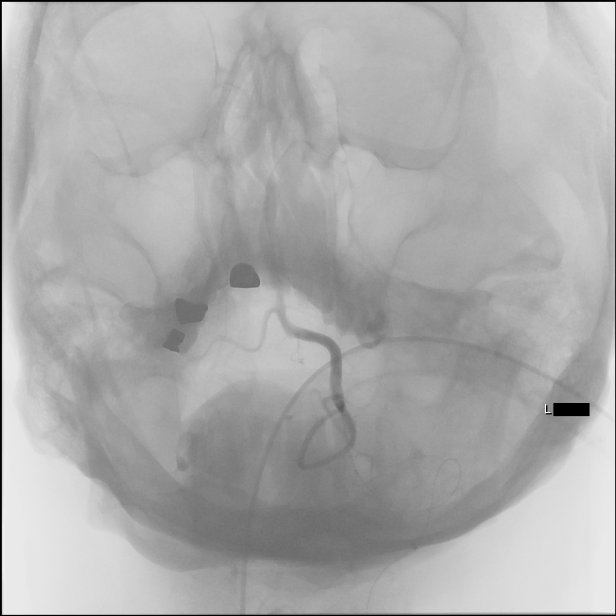
[im 151/272]
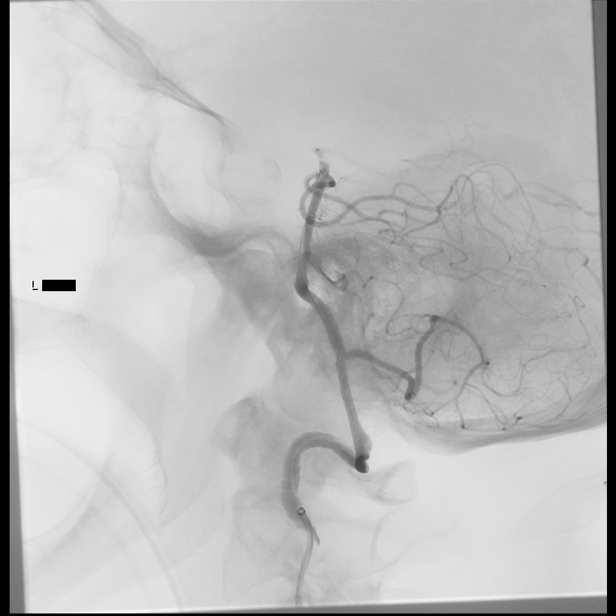
[im 196/272]
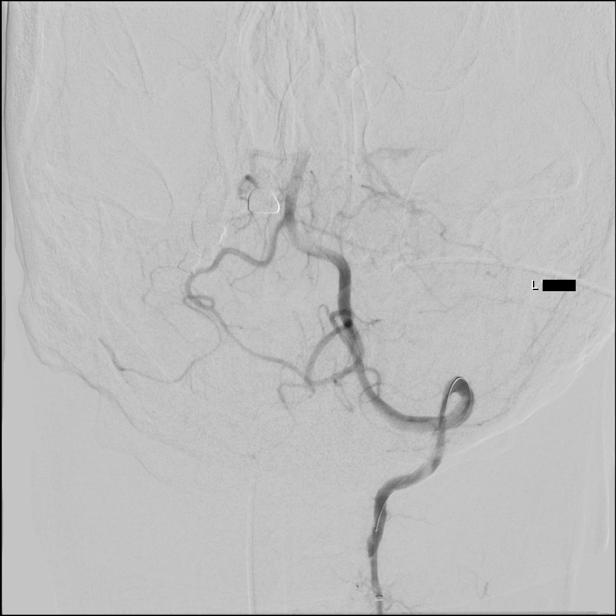
[im 256/272]
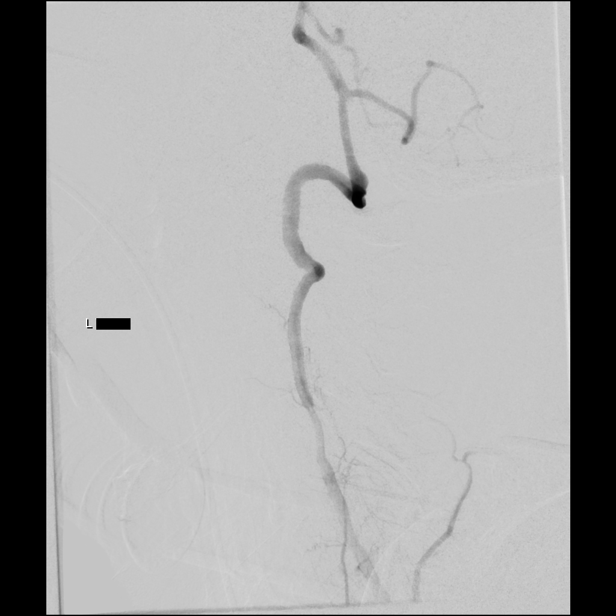

[7 of 24 positions shown; findings below may reference images not displayed]

MEDICATIONS:
Vancomycin 1 g IV was administered within 1 hour of the procedure.

ANESTHESIA/SEDATION:
General anesthesia.

CONTRAST:  Omnipaque 300 approximately 100 mL.

FLUOROSCOPY TIME:  Fluoroscopy Time: 39 minutes 1 seconds ([QM]
mGy).

COMPLICATIONS:
None immediate.
The right groin was prepped and draped in the usual sterile manner.
Using modified Seldinger technique, transfemoral access into the
right common femoral artery was obtained over a 0.035 inch J tip
guidewire. Through this, a 25 cm 8 French Pinnacle sheath was then
inserted. The obturator and the guidewire were removed. Good
aspiration obtained from the side port of the femoral sheath. This
was then connected to continuous heparinized saline infusion.

Over a 0.035 inch Roadrunner guidewire a 5 French JB 1 catheter was
advanced to the aortic arch region, and selectively positioned just
proximal to the origin of the left vertebral artery. Arteriograms
were then perfor[REDACTED]ed extra cranially and intracranially.
FINDINGS: The origin of the dominant left vertebral artery is widely patent.

There is slow ascent of contrast to the cranial skull base. Mild
stenosis of the vertebral arteries is seen at the level of
approximately C2. More distally, to this severe pre occlusive
stenosis was seen at the level of approximately C2. Slow ascent of
contrast was seen distal to this to the cranial skull base with
opacification into the suboccipital region of the left vertebral
artery. No distal antegrade flow was appreciated.

PROCEDURE:
A 5 French JB 1 catheter was then advanced distal to the origin of
the left vertebral artery and exchanged over a 0.035 inch 260 cm
Roadrunner guidewire for an 8 French 90 cm Neuron Max sheath. The
sheath was advanced distal to the origin of the left vertebral
artery. The guidewire was removed. Good aspiration obtained from the
hub of the Neuron Max sheath. Over a 0.035 inch Roadrunner
guidewire, a 115 cm 5 French Catalyst guide catheter was then
advanced to the level of C2. The guidewire was removed. Free
aspiration of blood was noted at the hub of the 5 French Catalyst
guide catheter. A gentle control arteriogram performed through this
demonstrates severe pre occlusive stenosis just distal to this at
the level of C1. Again noted was slow ascent of contrast distal to
this with no antegrade flow beyond.

Measurements were then performed of the diameter of the vertebral
artery just proximal to the severe stenosis and just distal to this.

A 2 mm x 15 mm [REDACTED] balloon angioplasty microcatheter was then
selected. This was prepped antegradely with heparinized saline
infusion in the wire port, and 75% contrast and 25% heparinized
saline infusion in the balloon port. Over a 0.014 inch standard
Synchro micro with a moderate J configuration the [REDACTED] balloon
catheter was advanced without difficulty to the distal end of the 5
French Catalyst guide catheter.

The micro guidewire was then gently maneuvered using a torque device
and access was obtained through the severely stenotic left vertebral
artery. The wire was navigated without difficulty more distally
without difficulty.

The balloon was then positioned adequate distance at the site of the
stenosis. A control inflation was then performed using micro
inflation syringe device via micro tubing to approximately
atmospheres where it was maintained for approximately a minute. The
balloon was then deflated and retrieved proximally. A gentle control
arteriogram performed through the 5 French Catalyst guide catheter
demonstrates significantly improved caliber and flow through the
angioplastied segment.

However, there continued to be stagnation in the proximal left
vertebrobasilar junction.

The micro guidewire was then gently manipulated followed by the
balloon catheter to the left suboccipital region.

The micro guidewire was removed. Good aspiration obtained from the
hub of the balloon microcatheter. Gentle control arteriogram
performed through this demonstrated completely occluded left
vertebrobasilar junction at the level of the suboccipital region.

The wire was reintroduced, and using biplane roadmap technique and
constant fluoroscopic guidance, and a torque device, the micro
guidewire was gently manipulated into the left vertebrobasilar
junction just proximal to the basilar artery. The guidewire was
removed. Again gentle control arteriogram performed through the
microcatheter now demonstrated antegrade flow into the basilar
artery with a complete occlusion noted at the level of the dominant
left posteroinferior cerebellar artery.

The balloon was then advanced and positioned at the site of the
occlusion distally. Again a control inflation was performed with a
micro inflation syringe device via micro tubing to approximately
atmospheres where it was maintained for again approximally 60
seconds. The balloon was deflated and retrieved proximally.

A control arteriogram performed through the 5 French Catalyst guide
catheter now demonstrated brisk antegrade flow through the
previously occluded dominant left vertebrobasilar junction into the
basilar artery with opacification of the left posterior cerebral
artery, the superior cerebellar arteries and the anterior-inferior
cerebellar arteries. Also noted was retrograde flow into the non
dominant right vertebrobasilar junction including the right anterior
inferior cerebellar artery/posteroinferior cerebellar artery
complex. The micro guidewire was reintroduced, and the balloon was
advanced to the distal basilar artery.

Balloon was then exchanged for a 300 cm 014 inch Zoom exchange
microcatheter under constant fluoroscopic guidance. The distal end
of the exchange wire had a moderate J configuration.

Measurements were then performed of the left vertebrobasilar
junction proximal to the dominant left posteroinferior cerebellar
artery.

It was decided to proceed with the placement of a 0.25 mm x 22 mm
Resolute Onyx balloon mounted stent. This was prepped retrogradely
with heparinized saline infusion, and antegradely with 75% contrast
and 25% heparinized saline infusion.

Using the rapid exchange technique, the stent delivery catheter was
advanced without difficulty to the left vertebrobasilar junction.
The distal marker was positioned at the level just proximal to the
origin of the left posterior-inferior cerebellar artery.

Thereafter, a controlled inflation was then performed using a micro
inflation syringe device via micro tubing to approximately 2.2 mm
where it was maintained for approximately 25 seconds. The balloon
was then deflated and retrieved proximally.

Control arteriogram performed through the 5 French Catalyst guide
catheter which had now been advanced to the left occipital region
demonstrated excellent flow through the previously occluded dominant
left vertebrobasilar junction at the level of the posteroinferior
cerebellar artery.

The stent delivery catheter was removed. Measurements were then
performed of the severely stenotic left vertebral artery at the
level of C2.

It was elected to proceed with placement of a 3 mm x 15 mm Resolute
Onyx balloon mounted stent. After the stent delivery apparatus had
been prepped as described above, again using the rapid exchange
technique the delivery system was advanced and positioned with the
distal and proximal markers adequate distance from the site of the
severe stenosis. This stent was again deployed a using micro
inflation syringe device via micro tubing to a just over 3.1 mm.

The delivery balloon was then retrieved and removed. A control
arteriogram performed through the 5 French Catalyst guide catheter
demonstrated excellent flow through the stented segment.

However, there continued to be a significant stenosis proximal to
this.

This necessitated the use of a second 3 mm x 22 mm Resolute Onyx
stent. Again after having been prepped in the usual manner, this
delivery system was again advanced and using the rapid exchange
technique and positioned such that the distal marker of this stent
was at least inside the previous stent. This stent was then deployed
in the usual manner again using micro inflation syringe device via
micro tubing with the balloon being inflated to just over 3.1 mm
where it was maintained for approximately 30 seconds. The balloon
was then retrieved and removed. A control arteriogram performed
through the Catalyst guide catheter in the distal left vertebral
artery now demonstrated excellent flow through the entirety of the
left vertebral artery distally and the vertebrobasilar junction and
the basilar artery.

Opacification was noted of the dominant left posterior cerebral
arteries, the superior cerebellar arteries and the anterior-inferior
cerebellar arteries. The left posterior-inferior cerebellar artery
remained patent.

At this time, the patient was given a total of 4.5 mg of Integrilin
in order to prevent platelet aggregation at the site of stent
placements. The patient's ACT was maintained in the region of
approximately 220 seconds.

Control arteriograms were then performed through the Catalyst guide
catheter at 15 and 30 minutes post deployment of the most recent
stent. These continued to demonstrate excellent flow through the
stented segments of the distal left vertebral artery and
vertebrobasilar junction and distally. No evidence of intra stent
filling defects of distal filling defects or of occlusion was noted.

The Catalyst guide catheter was removed. A final control arteriogram
performed through the Neuron Max sheath at the origin of the left
vertebral artery continued to demonstrate excellent flow through the
entirety of the left vertebral artery proximally and distally
through the stented segments and the distal basilar artery, the
posterior cerebral artery on the left and the anterior-inferior
cerebellar arteries.

There continues to be mild stenosis of the left vertebral artery at
the level of approximately C3.

The Neuron Max sheath was removed. The 8 French Pinnacle sheath was
removed with successful hemostasis at the right groin puncture site
with a combination of a 6 French Angio-Seal closure device and a
quick clot compression for about 15 minutes.

Distal pulses remained palpable in both feet.

A CT of the brain performed demonstrated no evidence of intracranial
hemorrhage or mass effect.

Patient's overall hemodynamic status remained stable throughout the
procedure.

The patient was given approximately 5 mg of protamine sulfate for
partial reversal of heparin.

Patient's general anesthesia was then reversed and the patient was
then extubated without difficulty. Upon recovery, the patient
reported no nausea, vomiting, headaches or vision difficulties.

He was able to move all his four limbs spontaneously and to command
equally.

He was then transferred to the PACU and then neuro ICU to continue
on low-dose IV heparin, blood pressure monitoring and management,
and close neurologic observations.

No issues occurred overnight. The following morning, neurologically
the patient remained asymptomatic. He denies again any headaches,
nausea, vomiting or visual aberrations. A nonfocal neurologic
examination was encountered.

The right groin appeared soft without evidence of hematoma. Distal
pulses remained palpable.

Patient was able to take oral liquids and solids. He was then
ambulated initially with assistance and then independently. Patient
was then discharged under the care of his spouse.

Patient was advised to maintain adequate hydration and continue on
81 mg aspirin, and 75 mg of Plavix. Patient was advised to resume
his preprocedural medications.

He was also advised against driving for a couple of weeks. Also he
was advised to refrain from stooping, bending or lifting weights
above 10 pounds for 2 weeks.

Should the patient develop stroke-like symptoms, the patient was
advised to call 911. Patient expressed understanding and agreement
with the above management plan.
IMPRESSION: Status post revascularization of occluded dominant left
vertebrobasilar junction at the level of the left posterior-inferior
cerebellar artery with stent assisted angioplasty.

Status post revascularization of pre occlusive distal left vertebral
artery stenosis with stent assisted angioplasty.

PLAN:
Follow-up in the clinic in 2 weeks time.

Patient instructed to have his renal function checked by his primary
toward the end of the week.

## 2021-08-27 SURGERY — IR WITH ANESTHESIA
Anesthesia: General

## 2021-08-27 MED ORDER — HEPARIN SODIUM (PORCINE) 1000 UNIT/ML IJ SOLN
INTRAMUSCULAR | Status: DC | PRN
  Administered 2021-08-27: 3000 [IU] via INTRAVENOUS

## 2021-08-27 MED ORDER — PROTAMINE SULFATE 10 MG/ML IV SOLN
INTRAVENOUS | Status: DC | PRN
  Administered 2021-08-27: 5 mg via INTRAVENOUS

## 2021-08-27 MED ORDER — VANCOMYCIN HCL 1000 MG IV SOLR: 1000.0000 mg | INTRAVENOUS | Status: DC

## 2021-08-27 MED ORDER — EPTIFIBATIDE 20 MG/10ML IV SOLN
INTRAVENOUS | Status: DC | PRN
  Administered 2021-08-27 (×3): 1.5 mg via INTRAVENOUS

## 2021-08-27 MED ORDER — NIMODIPINE 30 MG PO CAPS
ORAL_CAPSULE | ORAL | Status: AC
  Administered 2021-08-27: 30 mg via ORAL
  Filled 2021-08-27: qty 2

## 2021-08-27 MED ORDER — HEPARIN (PORCINE) 25000 UT/250ML-% IV SOLN: 500.0000 [IU]/h | INTRAVENOUS | Status: DC

## 2021-08-27 MED ORDER — SODIUM CHLORIDE (PF) 0.9 % IJ SOLN
INTRAVENOUS | Status: DC | PRN
  Administered 2021-08-27: 25 ug via INTRA_ARTERIAL

## 2021-08-27 MED ORDER — ASPIRIN 81 MG PO CHEW
81.0000 mg | CHEWABLE_TABLET | Freq: Every day | ORAL | Status: DC
  Administered 2021-08-28: 81 mg via ORAL
  Filled 2021-08-27: qty 1

## 2021-08-27 MED ORDER — HEPARIN (PORCINE) 25000 UT/250ML-% IV SOLN
INTRAVENOUS | Status: AC
  Filled 2021-08-27: qty 250

## 2021-08-27 MED ORDER — ASPIRIN EC 325 MG PO TBEC
DELAYED_RELEASE_TABLET | ORAL | Status: AC
  Administered 2021-08-27: 325 mg via ORAL
  Filled 2021-08-27: qty 1

## 2021-08-27 MED ORDER — SODIUM CHLORIDE 0.9 % IV SOLN: INTRAVENOUS | Status: DC

## 2021-08-27 MED ORDER — ASPIRIN 81 MG PO CHEW: 81.0000 mg | CHEWABLE_TABLET | Freq: Every day | ORAL | Status: DC

## 2021-08-27 MED ORDER — VANCOMYCIN HCL IN DEXTROSE 1-5 GM/200ML-% IV SOLN
INTRAVENOUS | Status: AC
  Filled 2021-08-27: qty 200

## 2021-08-27 MED ORDER — CLOPIDOGREL BISULFATE 75 MG PO TABS: 75.0000 mg | ORAL_TABLET | Freq: Once | ORAL | Status: AC

## 2021-08-27 MED ORDER — INSULIN ASPART 100 UNIT/ML IJ SOLN
INTRAMUSCULAR | Status: AC
  Filled 2021-08-27: qty 1

## 2021-08-27 MED ORDER — CHLORHEXIDINE GLUCONATE 0.12 % MT SOLN
15.0000 mL | Freq: Once | OROMUCOSAL | Status: AC
  Administered 2021-08-27: 15 mL via OROMUCOSAL
  Filled 2021-08-27: qty 15

## 2021-08-27 MED ORDER — CLOPIDOGREL BISULFATE 75 MG PO TABS
75.0000 mg | ORAL_TABLET | Freq: Every day | ORAL | Status: DC
  Administered 2021-08-28: 75 mg via ORAL
  Filled 2021-08-27: qty 1

## 2021-08-27 MED ORDER — VANCOMYCIN HCL IN DEXTROSE 1-5 GM/200ML-% IV SOLN
1000.0000 mg | Freq: Once | INTRAVENOUS | Status: AC
  Administered 2021-08-27: 1000 mg via INTRAVENOUS

## 2021-08-27 MED ORDER — INSULIN ASPART 100 UNIT/ML IJ SOLN
6.0000 [IU] | Freq: Once | INTRAMUSCULAR | Status: AC
  Administered 2021-08-27: 6 [IU] via SUBCUTANEOUS

## 2021-08-27 MED ORDER — ACETAMINOPHEN 325 MG PO TABS: 650.0000 mg | ORAL_TABLET | Freq: Four times a day (QID) | ORAL | Status: DC | PRN

## 2021-08-27 MED ORDER — EPTIFIBATIDE 20 MG/10ML IV SOLN
INTRAVENOUS | Status: AC
  Filled 2021-08-27: qty 10

## 2021-08-27 MED ORDER — LACTATED RINGERS IV SOLN: INTRAVENOUS | Status: DC

## 2021-08-27 MED ORDER — NIMODIPINE 30 MG PO CAPS
0.0000 mg | ORAL_CAPSULE | ORAL | Status: DC
  Filled 2021-08-27: qty 2

## 2021-08-27 MED ORDER — HEPARIN (PORCINE) 25000 UT/250ML-% IV SOLN
850.0000 [IU]/h | INTRAVENOUS | Status: DC
  Filled 2021-08-27: qty 250

## 2021-08-27 MED ORDER — IOHEXOL 300 MG/ML  SOLN
100.0000 mL | Freq: Once | INTRAMUSCULAR | Status: AC | PRN
  Administered 2021-08-27: 30 mL via INTRA_ARTERIAL

## 2021-08-27 MED ORDER — CHLORHEXIDINE GLUCONATE CLOTH 2 % EX PADS
6.0000 | MEDICATED_PAD | Freq: Every day | CUTANEOUS | Status: DC
  Administered 2021-08-27 – 2021-08-28 (×2): 6 via TOPICAL

## 2021-08-27 MED ORDER — CLEVIDIPINE BUTYRATE 0.5 MG/ML IV EMUL
INTRAVENOUS | Status: AC
  Filled 2021-08-27: qty 50

## 2021-08-27 MED ORDER — VANCOMYCIN HCL IN DEXTROSE 1-5 GM/200ML-% IV SOLN: 1000.0000 mg | INTRAVENOUS | Status: DC

## 2021-08-27 MED ORDER — ACETAMINOPHEN 325 MG PO TABS
650.0000 mg | ORAL_TABLET | ORAL | Status: DC | PRN
  Administered 2021-08-27 (×2): 650 mg via ORAL
  Filled 2021-08-27 (×2): qty 2

## 2021-08-27 MED ORDER — CLOPIDOGREL BISULFATE 75 MG PO TABS: 75.0000 mg | ORAL_TABLET | Freq: Every day | ORAL | Status: DC

## 2021-08-27 MED ORDER — CLEVIDIPINE BUTYRATE 0.5 MG/ML IV EMUL
INTRAVENOUS | Status: DC | PRN
  Administered 2021-08-27: 2 mg/h via INTRAVENOUS

## 2021-08-27 MED ORDER — PROPOFOL 10 MG/ML IV BOLUS
INTRAVENOUS | Status: AC
  Filled 2021-08-27: qty 20

## 2021-08-27 MED ORDER — FENTANYL CITRATE (PF) 100 MCG/2ML IJ SOLN
INTRAMUSCULAR | Status: AC
  Filled 2021-08-27: qty 2

## 2021-08-27 MED ORDER — FENTANYL CITRATE (PF) 100 MCG/2ML IJ SOLN
25.0000 ug | INTRAMUSCULAR | Status: DC | PRN
  Administered 2021-08-27: 50 ug via INTRAVENOUS

## 2021-08-27 MED ORDER — IOHEXOL 300 MG/ML  SOLN
100.0000 mL | Freq: Once | INTRAMUSCULAR | Status: AC | PRN
  Administered 2021-08-27: 90 mL via INTRA_ARTERIAL

## 2021-08-27 MED ORDER — LIDOCAINE 2% (20 MG/ML) 5 ML SYRINGE
INTRAMUSCULAR | Status: DC | PRN
  Administered 2021-08-27: 60 mg via INTRAVENOUS

## 2021-08-27 MED ORDER — ACETAMINOPHEN 160 MG/5ML PO SOLN: 650.0000 mg | ORAL | Status: DC | PRN

## 2021-08-27 MED ORDER — FENTANYL CITRATE (PF) 250 MCG/5ML IJ SOLN
INTRAMUSCULAR | Status: AC
  Filled 2021-08-27: qty 5

## 2021-08-27 MED ORDER — CLEVIDIPINE BUTYRATE 0.5 MG/ML IV EMUL
0.0000 mg/h | INTRAVENOUS | Status: AC
  Administered 2021-08-27: 21 mg/h via INTRAVENOUS
  Administered 2021-08-27: 18 mg/h via INTRAVENOUS
  Administered 2021-08-27: 21 mg/h via INTRAVENOUS
  Administered 2021-08-27: 4 mg/h via INTRAVENOUS
  Administered 2021-08-27: 1 mg/h via INTRAVENOUS
  Administered 2021-08-28 (×3): 21 mg/h via INTRAVENOUS
  Filled 2021-08-27 (×4): qty 100
  Filled 2021-08-27: qty 200
  Filled 2021-08-27: qty 100

## 2021-08-27 MED ORDER — INSULIN ASPART 100 UNIT/ML IJ SOLN
0.0000 [IU] | Freq: Three times a day (TID) | INTRAMUSCULAR | Status: DC
  Administered 2021-08-27: 3 [IU] via SUBCUTANEOUS
  Administered 2021-08-28 (×2): 8 [IU] via SUBCUTANEOUS

## 2021-08-27 MED ORDER — FINASTERIDE 5 MG PO TABS
5.0000 mg | ORAL_TABLET | Freq: Every day | ORAL | Status: DC
  Administered 2021-08-27 – 2021-08-28 (×2): 5 mg via ORAL
  Filled 2021-08-27 (×2): qty 1

## 2021-08-27 MED ORDER — FENTANYL CITRATE (PF) 250 MCG/5ML IJ SOLN
INTRAMUSCULAR | Status: DC | PRN
  Administered 2021-08-27 (×3): 50 ug via INTRAVENOUS
  Administered 2021-08-27: 100 ug via INTRAVENOUS

## 2021-08-27 MED ORDER — ONDANSETRON HCL 4 MG/2ML IJ SOLN
INTRAMUSCULAR | Status: DC | PRN
  Administered 2021-08-27: 4 mg via INTRAVENOUS

## 2021-08-27 MED ORDER — CLOPIDOGREL BISULFATE 75 MG PO TABS: 75.0000 mg | ORAL_TABLET | ORAL | Status: DC

## 2021-08-27 MED ORDER — ORAL CARE MOUTH RINSE: 15.0000 mL | Freq: Once | OROMUCOSAL | Status: AC

## 2021-08-27 MED ORDER — MAGNESIUM OXIDE -MG SUPPLEMENT 400 (240 MG) MG PO TABS
800.0000 mg | ORAL_TABLET | Freq: Every day | ORAL | Status: DC
  Administered 2021-08-27: 800 mg via ORAL
  Filled 2021-08-27 (×3): qty 2

## 2021-08-27 MED ORDER — NITROGLYCERIN 1 MG/10 ML FOR IR/CATH LAB
INTRA_ARTERIAL | Status: AC
  Filled 2021-08-27: qty 10

## 2021-08-27 MED ORDER — POLYVINYL ALCOHOL 1.4 % OP SOLN: 1.0000 [drp] | Freq: Three times a day (TID) | OPHTHALMIC | Status: DC | PRN

## 2021-08-27 MED ORDER — ALLOPURINOL 300 MG PO TABS
300.0000 mg | ORAL_TABLET | Freq: Every day | ORAL | Status: DC
  Administered 2021-08-27: 300 mg via ORAL
  Filled 2021-08-27: qty 1

## 2021-08-27 MED ORDER — DEXAMETHASONE SODIUM PHOSPHATE 10 MG/ML IJ SOLN
INTRAMUSCULAR | Status: DC | PRN
  Administered 2021-08-27: 5 mg via INTRAVENOUS

## 2021-08-27 MED ORDER — ASPIRIN EC 325 MG PO TBEC: 325.0000 mg | DELAYED_RELEASE_TABLET | ORAL | Status: DC

## 2021-08-27 MED ORDER — HEPARIN (PORCINE) 25000 UT/250ML-% IV SOLN
750.0000 [IU]/h | INTRAVENOUS | Status: DC
  Administered 2021-08-27: 750 [IU]/h via INTRAVENOUS
  Filled 2021-08-27: qty 250

## 2021-08-27 MED ORDER — CLOPIDOGREL BISULFATE 75 MG PO TABS
ORAL_TABLET | ORAL | Status: AC
  Administered 2021-08-27: 75 mg via ORAL
  Filled 2021-08-27: qty 1

## 2021-08-27 MED ORDER — ASPIRIN EC 325 MG PO TBEC: 325.0000 mg | DELAYED_RELEASE_TABLET | Freq: Once | ORAL | Status: AC

## 2021-08-27 MED ORDER — ROCURONIUM BROMIDE 10 MG/ML (PF) SYRINGE
PREFILLED_SYRINGE | INTRAVENOUS | Status: DC | PRN
  Administered 2021-08-27: 100 mg via INTRAVENOUS

## 2021-08-27 MED ORDER — PROPOFOL 10 MG/ML IV BOLUS
INTRAVENOUS | Status: DC | PRN
  Administered 2021-08-27: 50 mg via INTRAVENOUS
  Administered 2021-08-27: 100 mg via INTRAVENOUS

## 2021-08-27 MED ORDER — PHENYLEPHRINE HCL-NACL 20-0.9 MG/250ML-% IV SOLN
INTRAVENOUS | Status: DC | PRN
  Administered 2021-08-27: 20 ug/min via INTRAVENOUS

## 2021-08-27 MED ORDER — ACETAMINOPHEN 650 MG RE SUPP: 650.0000 mg | RECTAL | Status: DC | PRN

## 2021-08-27 MED ORDER — SUGAMMADEX SODIUM 200 MG/2ML IV SOLN
INTRAVENOUS | Status: DC | PRN
  Administered 2021-08-27: 200 mg via INTRAVENOUS

## 2021-08-27 MED ORDER — NIMODIPINE 30 MG PO CAPS: 30.0000 mg | ORAL_CAPSULE | Freq: Once | ORAL | Status: AC
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
## 2021-08-27 NOTE — Sedation Documentation (Signed)
Stent #2 deployed

## 2021-08-27 NOTE — Sedation Documentation (Signed)
Stent deployed

## 2021-08-27 NOTE — Progress Notes (Addendum)
Patient came to 4N ICU with one PIV. Patient has blood pressure in the 518Z systolic but cleviprex gtt cannot be started due to incompatibility with heparin gtt. Placed STAT PIV consult; MD (Deveshwar) notified.   Normajean Baxter, RN

## 2021-08-27 NOTE — Procedures (Signed)
S/P Lt vertebral arteriogram followed by revascularization of occluded Lt VBJ and near occlusive distal Lt vertebral artery with stent assisted angioplasty . Post CT brain No ICH  Hemostasis in the RT groin with 101F angioseal and quick clot. Distal pulses dopplerable in both feet. Extubated. Denies any H/As ,N/V or visual changes. Pupils 2 mm RT = LT  EOMS full.  No facial asymmetry. Tongue mid line  Moves all 4s spontaneously and to command. S.Cohan Stipes MD

## 2021-08-27 NOTE — Anesthesia Procedure Notes (Signed)
Procedure Name: Intubation Date/Time: 08/27/2021 9:12 AM Performed by: Reece Agar, CRNA Pre-anesthesia Checklist: Patient identified, Emergency Drugs available, Suction available and Patient being monitored Patient Re-evaluated:Patient Re-evaluated prior to induction Oxygen Delivery Method: Circle System Utilized Preoxygenation: Pre-oxygenation with 100% oxygen Induction Type: IV induction Ventilation: Mask ventilation without difficulty Laryngoscope Size: Mac and 4 Grade View: Grade I Tube type: Oral Tube size: 7.5 mm Number of attempts: 1 Airway Equipment and Method: Stylet Placement Confirmation: ETT inserted through vocal cords under direct vision, positive ETCO2 and breath sounds checked- equal and bilateral Secured at: 23 cm Tube secured with: Tape Dental Injury: Teeth and Oropharynx as per pre-operative assessment

## 2021-08-27 NOTE — Anesthesia Procedure Notes (Signed)
Arterial Line Insertion Start/End10/08/2021 8:05 AM, 08/27/2021 8:15 AM Performed by: Reece Agar, CRNA, CRNA  Patient location: Pre-op. Preanesthetic checklist: patient identified, IV checked, site marked, risks and benefits discussed, surgical consent, monitors and equipment checked, pre-op evaluation, timeout performed and anesthesia consent Lidocaine 1% used for infiltration Left, radial was placed Catheter size: 20 G Hand hygiene performed , maximum sterile barriers used  and Seldinger technique used Allen's test indicative of satisfactory collateral circulation Attempts: 1 Procedure performed without using ultrasound guided technique. Following insertion, dressing applied and Biopatch. Post procedure assessment: normal and unchanged  Patient tolerated the procedure well with no immediate complications.
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
## 2021-08-27 NOTE — Sedation Documentation (Signed)
Report given to PACU RN. Reviewed groin with RN at bedside, unremarkable. Pt vitals stable at handoff. CRNA present for transport and handoff as well.
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
## 2021-08-27 NOTE — Sedation Documentation (Signed)
Stent #3 deployed

## 2021-08-27 NOTE — Progress Notes (Signed)
Per Maudie Mercury in main lab, P2Y12 result is 121.

## 2021-08-27 NOTE — Transfer of Care (Signed)
Immediate Anesthesia Transfer of Care Note  Patient: Dave Wright  Procedure(s) Performed: STENTING  Patient Location: PACU  Anesthesia Type:General  Level of Consciousness: awake and alert   Airway & Oxygen Therapy: Patient Spontanous Breathing and Patient connected to face mask oxygen  Post-op Assessment: Report given to RN, Post -op Vital signs reviewed and stable, Patient moving all extremities X 4 and Patient able to stick tongue midline  Post vital signs: Reviewed and stable  Last Vitals:  Vitals Value Taken Time  BP 120/65 08/27/21 1238  Temp    Pulse 74 08/27/21 1243  Resp 11 08/27/21 1243  SpO2 96 % 08/27/21 1243  Vitals shown include unvalidated device data.  Last Pain:  Vitals:   08/27/21 0648  TempSrc:   PainSc: 2          Complications: No notable events documented.

## 2021-08-27 NOTE — Progress Notes (Signed)
Pt is s/p  revascularization of occluded Lt VBJ and near occlusive distal Lt vertebral artery with stent assisted angioplasty with Dr. Estanislado Pandy this morning.   Patient seen in pt's room with Dr. Estanislado Pandy.  Patient states that he is doing fine, just complaints of chronic left shoulder pain.   Alert, awake, and oriented x 3  Speech and comprehension intact EOMs intact  No facial asymmetry. Tongue midline  No pronator drift. Fine motor and coordination intact Distal pulses 1+  Positive dressing on R CFA puncture site. Site unremarkable with no erythema, edema, tenderness, bleeding or drainage. Minimal amount of blood noted on the dressing. Dressing otherwise clean, dry, and intact. Pressure dressing applied by Dr. Estanislado Pandy.   Plan on discharge tomorrow is stable.  Please call NIR for questions and concerns.   Armando Gang Zakeria Kulzer PA-C 08/27/2021 4:15 PM
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
## 2021-08-27 NOTE — Progress Notes (Signed)
This RN consulted with Deveshwar about goals for SBP. Was unclear when pt arrived to PACU. Cuff and ART line did not correlate, ART has a whip. Cuff is reading 140s/70s while the ART line is reading 190s/60s. Deveshwar verified with this RN to go off the cuff and is OK with cuff pressures (last being 145/79). Goal is SBP (on cuff) 120s-140s.

## 2021-08-27 NOTE — H&P (Deleted)
  The note originally documented on this encounter has been moved the the encounter in which it belongs.  

## 2021-08-27 NOTE — Progress Notes (Signed)
Anesthesia present for case 

## 2021-08-27 NOTE — Anesthesia Postprocedure Evaluation (Signed)
Anesthesia Post Note  Patient: TRUETT MCFARLAN  Procedure(s) Performed: STENTING     Patient location during evaluation: PACU Anesthesia Type: General Level of consciousness: awake and alert Pain management: pain level controlled Vital Signs Assessment: post-procedure vital signs reviewed and stable Respiratory status: spontaneous breathing, nonlabored ventilation, respiratory function stable and patient connected to nasal cannula oxygen Cardiovascular status: blood pressure returned to baseline and stable Postop Assessment: no apparent nausea or vomiting Anesthetic complications: no   No notable events documented.  Last Vitals:  Vitals:   08/27/21 1323 08/27/21 1338  BP: (!) 141/68 (!) 145/79  Pulse: 66 64  Resp: 10 10  Temp:    SpO2: 96% 98%    Last Pain:  Vitals:   08/27/21 1338  TempSrc:   PainSc: 4                  Mignon Bechler L Kemi Gell

## 2021-08-28 ENCOUNTER — Encounter (HOSPITAL_COMMUNITY): Payer: Self-pay | Admitting: Interventional Radiology

## 2021-08-28 LAB — GLUCOSE, CAPILLARY
Glucose-Capillary: 220 mg/dL — ABNORMAL HIGH (ref 70–99)
Glucose-Capillary: 254 mg/dL — ABNORMAL HIGH (ref 70–99)

## 2021-08-28 LAB — BASIC METABOLIC PANEL
Anion gap: 6 (ref 5–15)
BUN: 36 mg/dL — ABNORMAL HIGH (ref 8–23)
CO2: 21 mmol/L — ABNORMAL LOW (ref 22–32)
Calcium: 8.3 mg/dL — ABNORMAL LOW (ref 8.9–10.3)
Chloride: 107 mmol/L (ref 98–111)
Creatinine, Ser: 1.95 mg/dL — ABNORMAL HIGH (ref 0.61–1.24)
GFR, Estimated: 34 mL/min — ABNORMAL LOW (ref >60)
Glucose, Bld: 273 mg/dL — ABNORMAL HIGH (ref 70–99)
Potassium: 4.3 mmol/L (ref 3.5–5.1)
Sodium: 134 mmol/L — ABNORMAL LOW (ref 135–145)

## 2021-08-28 LAB — CBC WITH DIFFERENTIAL/PLATELET
Abs Immature Granulocytes: 0.13 10*3/uL — ABNORMAL HIGH (ref 0.00–0.07)
Basophils Absolute: 0 10*3/uL (ref 0.0–0.1)
Basophils Relative: 0 %
Eosinophils Absolute: 0 10*3/uL (ref 0.0–0.5)
Eosinophils Relative: 0 %
HCT: 32.5 % — ABNORMAL LOW (ref 39.0–52.0)
Hemoglobin: 10.3 g/dL — ABNORMAL LOW (ref 13.0–17.0)
Immature Granulocytes: 1 %
Lymphocytes Relative: 11 %
Lymphs Abs: 1 10*3/uL (ref 0.7–4.0)
MCH: 31.1 pg (ref 26.0–34.0)
MCHC: 31.7 g/dL (ref 30.0–36.0)
MCV: 98.2 fL (ref 80.0–100.0)
Monocytes Absolute: 1.3 10*3/uL — ABNORMAL HIGH (ref 0.1–1.0)
Monocytes Relative: 15 %
Neutro Abs: 6.7 10*3/uL (ref 1.7–7.7)
Neutrophils Relative %: 73 %
Platelets: 256 10*3/uL (ref 150–400)
RBC: 3.31 MIL/uL — ABNORMAL LOW (ref 4.22–5.81)
RDW: 14.9 % (ref 11.5–15.5)
WBC: 9.2 10*3/uL (ref 4.0–10.5)
nRBC: 0 % (ref 0.0–0.2)

## 2021-08-28 LAB — PLATELET INHIBITION P2Y12

## 2021-08-28 MED ORDER — HYDRALAZINE HCL 20 MG/ML IJ SOLN: 5.0000 mg | INTRAMUSCULAR | Status: DC | PRN

## 2021-08-28 NOTE — Discharge Summary (Addendum)
Patient ID: Dave Wright MRN: [id number] DOB/AGE: 82/29/1942 82 y.o.  Admit date: 08/27/2021 Discharge date: 08/28/2021  Supervising Physician: Luanne Bras  Patient Status: [facility] - In-pt  Admission Diagnoses: Left vertebrobasilar junction stenosis  Discharge Diagnoses:  Active Problems:   VBI (vertebrobasilar insufficiency)   Discharged Condition: stable  Hospital Course:   Pt admitted post Left vertebral arteriogram with angioplasty/stent placement to Left vertebrobasilar junction stenosis with Dr Estanislado Pandy 08/27/21 Overnight stay was without complication. Pt was premedicated with 13 hr prep--- glucose high but decreasing Denies pain; denies headache Eating well; No N/V Foley removed this am--- await urination on own. Dr Estanislado Pandy has seen and examined pt. Rt groin is clean and dry No hematoma; soft Plan to advance diet; up to chair and up to ambulate; DC to home today  Consults: None  Significant Diagnostic Studies: Cerebral arteriogram 10/10 with Dr Estanislado Pandy  Treatments: S/P Lt vertebral arteriogram followed by revascularization of occluded Lt VBJ and near occlusive distal Lt vertebral artery with stent assisted angioplasty . Post CT brain No ICH  Hemostasis in the RT groin with 51F angioseal and quick clot. Distal pulses dopplerable in both feet. Extubated. Denies any H/As ,N/V or visual changes. Pupils 2 mm RT = LT  EOMS full.  No facial asymmetry. Tongue mid line  Moves all 4s spontaneously and to command.  Discharge Exam: Blood pressure (!) 134/53, pulse 86, temperature 97.8 F (36.6 C), temperature source Oral, resp. rate 17, height 6' (1.829 m), weight 227 lb (103 kg), SpO2 94 %.  A/O Pleasant Face symmetrical Tongue midline EOM Heart; RRR Lungs: CTA Abd : soft- no masses Extr: FROM Good strength= Good sensation = Rt groin NT no bleeding; soft No hematoma Rt foot good pulses   Results for orders placed or performed during the  hospital encounter of 08/27/21  MRSA Next Gen by PCR, Nasal   Specimen: Nasal Mucosa; Nasal Swab  Result Value Ref Range   MRSA by PCR Next Gen NOT DETECTED NOT DETECTED  CBC per protocol  Result Value Ref Range   WBC 2.5 (L) 4.0 - 10.5 K/uL   RBC 3.61 (L) 4.22 - 5.81 MIL/uL   Hemoglobin 11.2 (L) 13.0 - 17.0 g/dL   HCT 35.9 (L) 39.0 - 52.0 %   MCV 99.4 80.0 - 100.0 fL   MCH 31.0 26.0 - 34.0 pg   MCHC 31.2 30.0 - 36.0 g/dL   RDW 14.6 11.5 - 15.5 %   Platelets 170 150 - 400 K/uL   nRBC 0.0 0.0 - 0.2 %  Glucose, capillary  Result Value Ref Range   Glucose-Capillary 287 (H) 70 - 99 mg/dL   Comment 1 Notify RN   Glucose, capillary  Result Value Ref Range   Glucose-Capillary 270 (H) 70 - 99 mg/dL  Heparin level (unfractionated)  Result Value Ref Range   Heparin Unfractionated <0.10 (L) 0.30 - 0.70 IU/mL  Glucose, capillary  Result Value Ref Range   Glucose-Capillary 201 (H) 70 - 99 mg/dL  CBC with Differential/Platelet  Result Value Ref Range   WBC 9.2 4.0 - 10.5 K/uL   RBC 3.31 (L) 4.22 - 5.81 MIL/uL   Hemoglobin 10.3 (L) 13.0 - 17.0 g/dL   HCT 32.5 (L) 39.0 - 52.0 %   MCV 98.2 80.0 - 100.0 fL   MCH 31.1 26.0 - 34.0 pg   MCHC 31.7 30.0 - 36.0 g/dL   RDW 14.9 11.5 - 15.5 %   Platelets 256 150 - 400 K/uL  nRBC 0.0 0.0 - 0.2 %   Neutrophils Relative % 73 %   Neutro Abs 6.7 1.7 - 7.7 K/uL   Lymphocytes Relative 11 %   Lymphs Abs 1.0 0.7 - 4.0 K/uL   Monocytes Relative 15 %   Monocytes Absolute 1.3 (H) 0.1 - 1.0 K/uL   Eosinophils Relative 0 %   Eosinophils Absolute 0.0 0.0 - 0.5 K/uL   Basophils Relative 0 %   Basophils Absolute 0.0 0.0 - 0.1 K/uL   Immature Granulocytes 1 %   Abs Immature Granulocytes 0.13 (H) 0.00 - 0.07 K/uL  Basic metabolic panel  Result Value Ref Range   Sodium 134 (L) 135 - 145 mmol/L   Potassium 4.3 3.5 - 5.1 mmol/L   Chloride 107 98 - 111 mmol/L   CO2 21 (L) 22 - 32 mmol/L   Glucose, Bld 273 (H) 70 - 99 mg/dL   BUN 36 (H) 8 - 23 mg/dL    Creatinine, Ser 1.95 (H) 0.61 - 1.24 mg/dL   Calcium 8.3 (L) 8.9 - 10.3 mg/dL   GFR, Estimated 34 (L) >60 mL/min   Anion gap 6 5 - 15  Glucose, capillary  Result Value Ref Range   Glucose-Capillary 164 (H) 70 - 99 mg/dL  Glucose, capillary  Result Value Ref Range   Glucose-Capillary 281 (H) 70 - 99 mg/dL  Glucose, capillary  Result Value Ref Range   Glucose-Capillary 220 (H) 70 - 99 mg/dL        Disposition:   Discharge Instructions     Call MD for:  difficulty breathing, headache or visual disturbances   Complete by: As directed    Call MD for:  hives   Complete by: As directed    Call MD for:  persistant dizziness or light-headedness   Complete by: As directed    Call MD for:  persistant nausea and vomiting   Complete by: As directed    Call MD for:  redness, tenderness, or signs of infection (pain, swelling, redness, odor or green/yellow discharge around incision site)   Complete by: As directed    Call MD for:  severe uncontrolled pain   Complete by: As directed    Call MD for:  temperature >100.4   Complete by: As directed    Diet - low sodium heart healthy   Complete by: As directed    Discharge instructions   Complete by: As directed    Continue all home meds; you will hear from scheduler for 2 week follow appt time with Dr Estanislado Pandy--- call [phone] if any needs or questions   Increase activity slowly   Complete by: As directed    No wound care   Complete by: As directed    Keep dressing on Rt groin for 24 hrs; may shower tomorrow; replace bandaid to rt groin daily x 7 days      Allergies as of 08/28/2021       Reactions   Ivp Dye [iodinated Diagnostic Agents] Hives   Penicillins Hives        Medication List     TAKE these medications    acetaminophen 325 MG tablet Commonly known as: TYLENOL Take 650 mg by mouth every 6 (six) hours as needed for moderate pain.   allopurinol 300 MG tablet Commonly known as: ZYLOPRIM TAKE 1 TABLET BY  MOUTH AT  BEDTIME   aspirin EC 81 MG tablet Take 81 mg by mouth daily. Swallow whole.   atorvastatin 10 MG tablet Commonly known  as: LIPITOR Take 1 tablet (10 mg total) by mouth daily. What changed: when to take this   carboxymethylcellulose 0.5 % Soln Commonly known as: REFRESH PLUS Place 1 drop into both eyes 3 (three) times daily as needed (dry eyes).   clopidogrel 75 MG tablet Commonly known as: Plavix Take 1 tablet (75 mg total) by mouth daily.   diphenhydrAMINE 50 MG tablet Commonly known as: BENADRYL Take one tab within one hour of CTA   fenofibrate 54 MG tablet TAKE 2 TABLETS BY MOUTH  DAILY What changed:  when to take this additional instructions   finasteride 5 MG tablet Commonly known as: PROSCAR Take 1 tablet (5 mg total) by mouth daily. What changed: when to take this   Lantus 100 UNIT/ML injection Generic drug: insulin glargine INJECT SUBCUTANEOUSLY 39  UNITS IN THE MORNING AND 28 UNITS IN THE EVENING   magnesium oxide 400 MG tablet Commonly known as: MAG-OX Take 800 mg by mouth at bedtime.   predniSONE 50 MG tablet Commonly known as: DELTASONE Take 1 tablet 13 hrs, 7 hrs, and 1 hr prior to procedure scheduled 08/27/21        Follow-up Information     Luanne Bras, MD Follow up in 2 week(s).   Specialties: Interventional Radiology, Radiology Why: follow up with Dr Estanislado Pandy 2 weeks--- left VBJ angioplasty stent 08/27/21; call [phone] if any needs or questions Contact information: [address] [place] [address] [phone]                  Electronically Signed: Lavonia Drafts, PA-C 08/28/2021, 9:06 AM   I have spent Greater Than 30 Minutes discharging Lorenda Hatchet.

## 2021-08-28 NOTE — Progress Notes (Signed)
Discharge instructions reviewed and all questions answered at this time. Hospital equipment & PIV removed at this time. Belongings gathered and sent with pt. No needs identified at this time.

## 2021-08-29 ENCOUNTER — Telehealth: Payer: Self-pay | Admitting: *Deleted

## 2021-08-29 NOTE — Telephone Encounter (Signed)
Transition Care Management Follow-up Telephone Call Date of discharge and from where: [facility] 08-28-2021 How have you been since you were released from the hospital? Feeling ok little wobbly Any questions or concerns? No  Items Reviewed: Did the pt receive and understand the discharge instructions provided? Yes  Medications obtained and verified? Yes  Other? No  Any new allergies since your discharge? No  Dietary orders reviewed? Yes Do you have support at home? No   Home Care and Equipment/Supplies: Were home health services ordered? not applicable If so, what is the name of the agency?   Has the agency set up a time to come to the patient's home? not applicable Were any new equipment or medical supplies ordered?  No What is the name of the medical supply agency?  Were you able to get the supplies/equipment? not applicable Do you have any questions related to the use of the equipment or supplies? No  Functional Questionnaire: (I = Independent and D = Dependent) ADLs: D  Bathing/Dressing- D  Meal Prep- D  Eating- I  Maintaining continence- I  Transferring/Ambulation- D  Managing Meds- D  Follow up appointments reviewed:  PCP Hospital f/u appt confirmed? Yes  Scheduled to see [place] on 08-31-2021 @ 9:00. [facility] f/u appt confirmed? No   Are transportation arrangements needed? No  If their condition worsens, is the pt aware to call PCP or go to the Emergency Dept.? Yes Was the patient provided with contact information for the PCP's office or ED? Yes Was to pt encouraged to call back with questions or concerns? Yes

## 2021-08-31 ENCOUNTER — Inpatient Hospital Stay: Payer: Medicare Other | Admitting: Family Medicine

## 2021-09-05 ENCOUNTER — Encounter: Payer: Self-pay | Admitting: Family Medicine

## 2021-09-06 ENCOUNTER — Ambulatory Visit (INDEPENDENT_AMBULATORY_CARE_PROVIDER_SITE_OTHER): Payer: Medicare Other | Admitting: Family Medicine

## 2021-09-06 ENCOUNTER — Other Ambulatory Visit: Payer: Self-pay

## 2021-09-06 ENCOUNTER — Encounter: Payer: Self-pay | Admitting: Family Medicine

## 2021-09-06 VITALS — BP 155/73 | HR 85 | Temp 97.4°F | Ht 72.0 in | Wt 224.0 lb

## 2021-09-06 DIAGNOSIS — I6503 Occlusion and stenosis of bilateral vertebral arteries: Secondary | ICD-10-CM | POA: Diagnosis not present

## 2021-09-06 DIAGNOSIS — R5382 Chronic fatigue, unspecified: Secondary | ICD-10-CM

## 2021-09-06 DIAGNOSIS — N401 Enlarged prostate with lower urinary tract symptoms: Secondary | ICD-10-CM | POA: Diagnosis not present

## 2021-09-06 DIAGNOSIS — N138 Other obstructive and reflux uropathy: Secondary | ICD-10-CM

## 2021-09-06 DIAGNOSIS — N183 Chronic kidney disease, stage 3 unspecified: Secondary | ICD-10-CM

## 2021-09-06 DIAGNOSIS — D649 Anemia, unspecified: Secondary | ICD-10-CM | POA: Diagnosis not present

## 2021-09-06 DIAGNOSIS — Z23 Encounter for immunization: Secondary | ICD-10-CM

## 2021-09-06 MED ORDER — TAMSULOSIN HCL 0.4 MG PO CAPS: 0.4000 mg | ORAL_CAPSULE | Freq: Every day | ORAL | 3 refills | Status: DC
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
## 2021-09-07 LAB — BASIC METABOLIC PANEL
BUN: 38 mg/dL — ABNORMAL HIGH (ref 6–23)
CO2: 25 mEq/L (ref 19–32)
Calcium: 9.5 mg/dL (ref 8.4–10.5)
Chloride: 103 mEq/L (ref 96–112)
Creatinine, Ser: 1.62 mg/dL — ABNORMAL HIGH (ref 0.40–1.50)
GFR: 39.38 mL/min — ABNORMAL LOW (ref >60.00)
Glucose, Bld: 148 mg/dL — ABNORMAL HIGH (ref 70–99)
Potassium: 4.9 mEq/L (ref 3.5–5.1)
Sodium: 136 mEq/L (ref 135–145)

## 2021-09-07 LAB — CBC WITH DIFFERENTIAL/PLATELET
Basophils Absolute: 0.1 10*3/uL (ref 0.0–0.1)
Basophils Relative: 0.9 % (ref 0.0–3.0)
Eosinophils Absolute: 0.2 10*3/uL (ref 0.0–0.7)
Eosinophils Relative: 3.3 % (ref 0.0–5.0)
HCT: 34.4 % — ABNORMAL LOW (ref 39.0–52.0)
Hemoglobin: 11.3 g/dL — ABNORMAL LOW (ref 13.0–17.0)
Lymphocytes Relative: 14 % (ref 12.0–46.0)
Lymphs Abs: 1 10*3/uL (ref 0.7–4.0)
MCHC: 32.9 g/dL (ref 30.0–36.0)
MCV: 95.1 fl (ref 78.0–100.0)
Monocytes Absolute: 1.2 10*3/uL — ABNORMAL HIGH (ref 0.1–1.0)
Monocytes Relative: 16.8 % — ABNORMAL HIGH (ref 3.0–12.0)
Neutro Abs: 4.6 10*3/uL (ref 1.4–7.7)
Neutrophils Relative %: 65 % (ref 43.0–77.0)
Platelets: 243 10*3/uL (ref 150.0–400.0)
RBC: 3.62 Mil/uL — ABNORMAL LOW (ref 4.22–5.81)
RDW: 16.1 % — ABNORMAL HIGH (ref 11.5–15.5)
WBC: 7 10*3/uL (ref 4.0–10.5)

## 2021-09-07 LAB — IRON,TIBC AND FERRITIN PANEL
%SAT: 11 % (calc) — ABNORMAL LOW (ref 20–48)
Ferritin: 241 ng/mL (ref 24–380)
Iron: 41 ug/dL — ABNORMAL LOW (ref 50–180)
TIBC: 375 mcg/dL (calc) (ref 250–425)

## 2021-09-11 ENCOUNTER — Other Ambulatory Visit: Payer: Self-pay

## 2021-09-11 ENCOUNTER — Ambulatory Visit (HOSPITAL_COMMUNITY)
Admission: RE | Admit: 2021-09-11 | Discharge: 2021-09-11 | Disposition: A | Discharge status: Home / Self Care | Payer: Medicare Other | Source: Ambulatory Visit | Attending: Radiology | Admitting: Radiology

## 2021-09-11 DIAGNOSIS — I771 Stricture of artery: Secondary | ICD-10-CM

## 2021-09-14 HISTORY — PX: IR RADIOLOGIST EVAL & MGMT: IMG5224

## 2021-10-03 ENCOUNTER — Encounter: Payer: Self-pay | Admitting: Family Medicine

## 2021-10-03 ENCOUNTER — Other Ambulatory Visit: Payer: Self-pay

## 2021-10-03 ENCOUNTER — Ambulatory Visit (INDEPENDENT_AMBULATORY_CARE_PROVIDER_SITE_OTHER): Payer: Medicare Other | Admitting: Family Medicine

## 2021-10-03 VITALS — BP 129/65 | HR 87 | Temp 97.5°F | Ht 72.0 in | Wt 224.0 lb

## 2021-10-03 DIAGNOSIS — N183 Chronic kidney disease, stage 3 unspecified: Secondary | ICD-10-CM | POA: Diagnosis not present

## 2021-10-03 DIAGNOSIS — R5382 Chronic fatigue, unspecified: Secondary | ICD-10-CM | POA: Diagnosis not present

## 2021-10-03 DIAGNOSIS — E1121 Type 2 diabetes mellitus with diabetic nephropathy: Secondary | ICD-10-CM | POA: Diagnosis not present

## 2021-10-03 DIAGNOSIS — E7800 Pure hypercholesterolemia, unspecified: Secondary | ICD-10-CM

## 2021-10-03 DIAGNOSIS — N401 Enlarged prostate with lower urinary tract symptoms: Secondary | ICD-10-CM | POA: Diagnosis not present

## 2021-10-03 DIAGNOSIS — I6503 Occlusion and stenosis of bilateral vertebral arteries: Secondary | ICD-10-CM | POA: Diagnosis not present

## 2021-10-03 DIAGNOSIS — N138 Other obstructive and reflux uropathy: Secondary | ICD-10-CM

## 2021-10-03 LAB — COMPREHENSIVE METABOLIC PANEL
ALT: 26 U/L (ref 0–53)
AST: 40 U/L — ABNORMAL HIGH (ref 0–37)
Albumin: 3.7 g/dL (ref 3.5–5.2)
Alkaline Phosphatase: 78 U/L (ref 39–117)
BUN: 26 mg/dL — ABNORMAL HIGH (ref 6–23)
CO2: 25 mEq/L (ref 19–32)
Calcium: 8.9 mg/dL (ref 8.4–10.5)
Chloride: 102 mEq/L (ref 96–112)
Creatinine, Ser: 1.78 mg/dL — ABNORMAL HIGH (ref 0.40–1.50)
GFR: 35.16 mL/min — ABNORMAL LOW (ref >60.00)
Glucose, Bld: 189 mg/dL — ABNORMAL HIGH (ref 70–99)
Potassium: 5.1 mEq/L (ref 3.5–5.1)
Sodium: 136 mEq/L (ref 135–145)
Total Bilirubin: 0.7 mg/dL (ref 0.2–1.2)
Total Protein: 6.7 g/dL (ref 6.0–8.3)

## 2021-10-03 LAB — LIPID PANEL
Cholesterol: 78 mg/dL (ref 0–200)
HDL: 44.9 mg/dL (ref >39.00)
LDL Cholesterol: 22 mg/dL (ref 0–99)
NonHDL: 33.25
Total CHOL/HDL Ratio: 2
Triglycerides: 57 mg/dL (ref 0.0–149.0)
VLDL: 11.4 mg/dL (ref 0.0–40.0)

## 2021-10-03 LAB — MICROALBUMIN / CREATININE URINE RATIO
Creatinine,U: 151.6 mg/dL
Microalb Creat Ratio: 6.2 mg/g (ref 0.0–30.0)
Microalb, Ur: 9.4 mg/dL — ABNORMAL HIGH (ref 0.0–1.9)

## 2021-10-03 LAB — HEMOGLOBIN A1C: Hgb A1c MFr Bld: 6.7 % — ABNORMAL HIGH (ref 4.6–6.5)

## 2021-10-03 MED ORDER — TAMSULOSIN HCL 0.4 MG PO CAPS: 0.8000 mg | ORAL_CAPSULE | Freq: Every day | ORAL | 3 refills | Status: DC

## 2021-10-03 MED ORDER — LANTUS 100 UNIT/ML ~~LOC~~ SOLN: SUBCUTANEOUS | 1 refills | Status: DC

## 2021-10-03 MED ORDER — ZOSTER VAC RECOMB ADJUVANTED 50 MCG/0.5ML IM SUSR: 0.5000 mL | Freq: Once | INTRAMUSCULAR | 0 refills | Status: AC
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
## 2021-10-03 NOTE — Patient Instructions (Signed)
Increase your flomax (tamsulosin) to TWO of the 0.4mg  tabs daily-->new rx sent in to [facility].  Continue 38 units lantus every morning and decrease lantus to 18 units in evening.  Ask for shingles vaccine at your pharmacy (Shingrix).

## 2021-10-04 ENCOUNTER — Telehealth: Payer: Self-pay

## 2021-10-04 NOTE — Telephone Encounter (Signed)
Ret'd voicemail from patient.  Patient states we called him and he is returning call

## 2021-10-04 NOTE — Telephone Encounter (Signed)
Dave Wright, [place]  10/04/2021  2:40 PM EST     Spoke with pt's wife regarding results/recommendations,voiced understanding

## 2021-10-23 ENCOUNTER — Telehealth (HOSPITAL_COMMUNITY): Payer: Self-pay

## 2021-10-23 NOTE — Telephone Encounter (Signed)
Returned call, no answer, left vm. AW
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
## 2021-10-29 ENCOUNTER — Ambulatory Visit (INDEPENDENT_AMBULATORY_CARE_PROVIDER_SITE_OTHER): Payer: Medicare Other | Admitting: Neurology

## 2021-10-29 ENCOUNTER — Other Ambulatory Visit: Payer: Self-pay

## 2021-10-29 ENCOUNTER — Encounter: Payer: Self-pay | Admitting: Neurology

## 2021-10-29 VITALS — BP 116/58 | HR 110 | Ht 72.0 in | Wt 226.6 lb

## 2021-10-29 DIAGNOSIS — I6503 Occlusion and stenosis of bilateral vertebral arteries: Secondary | ICD-10-CM | POA: Diagnosis not present

## 2021-10-29 DIAGNOSIS — I6502 Occlusion and stenosis of left vertebral artery: Secondary | ICD-10-CM | POA: Diagnosis not present

## 2021-10-29 NOTE — Patient Instructions (Signed)
Continue walking Follow up 7-8 months.

## 2021-10-30 ENCOUNTER — Encounter: Payer: Self-pay | Admitting: Family Medicine

## 2021-10-30 ENCOUNTER — Ambulatory Visit (INDEPENDENT_AMBULATORY_CARE_PROVIDER_SITE_OTHER): Payer: Medicare Other | Admitting: Family Medicine

## 2021-10-30 VITALS — BP 115/66 | HR 76 | Temp 97.7°F | Ht 72.0 in | Wt 226.8 lb

## 2021-10-30 DIAGNOSIS — E1129 Type 2 diabetes mellitus with other diabetic kidney complication: Secondary | ICD-10-CM

## 2021-10-30 DIAGNOSIS — R809 Proteinuria, unspecified: Secondary | ICD-10-CM

## 2021-10-30 DIAGNOSIS — D649 Anemia, unspecified: Secondary | ICD-10-CM

## 2021-10-30 DIAGNOSIS — I6503 Occlusion and stenosis of bilateral vertebral arteries: Secondary | ICD-10-CM | POA: Diagnosis not present

## 2021-10-30 DIAGNOSIS — L304 Erythema intertrigo: Secondary | ICD-10-CM | POA: Diagnosis not present

## 2021-10-30 DIAGNOSIS — R5382 Chronic fatigue, unspecified: Secondary | ICD-10-CM | POA: Diagnosis not present

## 2021-10-30 DIAGNOSIS — R5381 Other malaise: Secondary | ICD-10-CM

## 2021-10-30 DIAGNOSIS — N401 Enlarged prostate with lower urinary tract symptoms: Secondary | ICD-10-CM

## 2021-10-30 DIAGNOSIS — N138 Other obstructive and reflux uropathy: Secondary | ICD-10-CM

## 2021-10-30 DIAGNOSIS — D509 Iron deficiency anemia, unspecified: Secondary | ICD-10-CM | POA: Diagnosis not present

## 2021-10-30 DIAGNOSIS — Z794 Long term (current) use of insulin: Secondary | ICD-10-CM

## 2021-10-30 LAB — CBC
HCT: 34.4 % — ABNORMAL LOW (ref 39.0–52.0)
Hemoglobin: 11.1 g/dL — ABNORMAL LOW (ref 13.0–17.0)
MCHC: 32.2 g/dL (ref 30.0–36.0)
MCV: 97.9 fl (ref 78.0–100.0)
Platelets: 174 10*3/uL (ref 150.0–400.0)
RBC: 3.51 Mil/uL — ABNORMAL LOW (ref 4.22–5.81)
RDW: 16.4 % — ABNORMAL HIGH (ref 11.5–15.5)
WBC: 3.7 10*3/uL — ABNORMAL LOW (ref 4.0–10.5)

## 2021-10-30 MED ORDER — NYSTATIN 100000 UNIT/GM EX CREA: 1.0000 "application " | TOPICAL_CREAM | Freq: Two times a day (BID) | CUTANEOUS | 1 refills | Status: DC
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
## 2021-10-30 NOTE — Patient Instructions (Signed)
Apply the nystatin cream to anal area twice daily. AFTER each application, put a thick layer of desitin ointment over it.

## 2021-10-31 LAB — IRON,TIBC AND FERRITIN PANEL
%SAT: 13 % (calc) — ABNORMAL LOW (ref 20–48)
Ferritin: 152 ng/mL (ref 24–380)
Iron: 48 ug/dL — ABNORMAL LOW (ref 50–180)
TIBC: 377 mcg/dL (calc) (ref 250–425)

## 2021-11-05 ENCOUNTER — Ambulatory Visit: Payer: Medicare Other

## 2021-11-05 ENCOUNTER — Other Ambulatory Visit: Payer: Self-pay

## 2021-11-05 DIAGNOSIS — D509 Iron deficiency anemia, unspecified: Secondary | ICD-10-CM

## 2021-11-05 DIAGNOSIS — D649 Anemia, unspecified: Secondary | ICD-10-CM

## 2021-11-05 DIAGNOSIS — R5382 Chronic fatigue, unspecified: Secondary | ICD-10-CM

## 2021-11-06 LAB — HEMOCCULT SLIDES (X 3 CARDS)
Fecal Occult Blood: NEGATIVE
OCCULT 1: NEGATIVE
OCCULT 2: NEGATIVE
OCCULT 3: NEGATIVE
OCCULT 4: NEGATIVE
OCCULT 5: NEGATIVE

## 2021-11-25 ENCOUNTER — Other Ambulatory Visit: Payer: Self-pay | Admitting: Family Medicine

## 2021-12-13 ENCOUNTER — Other Ambulatory Visit: Payer: Self-pay | Admitting: Family Medicine

## 2021-12-15 ENCOUNTER — Other Ambulatory Visit: Payer: Self-pay

## 2021-12-15 ENCOUNTER — Encounter (HOSPITAL_COMMUNITY): Payer: Self-pay

## 2021-12-15 ENCOUNTER — Emergency Department (HOSPITAL_COMMUNITY)
Admission: EM | Admit: 2021-12-15 | Discharge: 2021-12-15 | Disposition: A | Discharge status: Home / Self Care | Payer: Medicare Other | Attending: Emergency Medicine | Admitting: Emergency Medicine

## 2021-12-15 DIAGNOSIS — E860 Dehydration: Secondary | ICD-10-CM | POA: Diagnosis not present

## 2021-12-15 DIAGNOSIS — Z7982 Long term (current) use of aspirin: Secondary | ICD-10-CM | POA: Insufficient documentation

## 2021-12-15 DIAGNOSIS — Z79899 Other long term (current) drug therapy: Secondary | ICD-10-CM | POA: Diagnosis not present

## 2021-12-15 DIAGNOSIS — Z7902 Long term (current) use of antithrombotics/antiplatelets: Secondary | ICD-10-CM | POA: Diagnosis not present

## 2021-12-15 DIAGNOSIS — L89319 Pressure ulcer of right buttock, unspecified stage: Secondary | ICD-10-CM | POA: Insufficient documentation

## 2021-12-15 DIAGNOSIS — T148XXA Other injury of unspecified body region, initial encounter: Secondary | ICD-10-CM

## 2021-12-15 DIAGNOSIS — X58XXXA Exposure to other specified factors, initial encounter: Secondary | ICD-10-CM | POA: Insufficient documentation

## 2021-12-15 DIAGNOSIS — S31819D Unspecified open wound of right buttock, subsequent encounter: Secondary | ICD-10-CM | POA: Diagnosis present

## 2021-12-15 DIAGNOSIS — L89309 Pressure ulcer of unspecified buttock, unspecified stage: Secondary | ICD-10-CM

## 2021-12-15 DIAGNOSIS — Z794 Long term (current) use of insulin: Secondary | ICD-10-CM | POA: Insufficient documentation

## 2021-12-15 LAB — CBC WITH DIFFERENTIAL/PLATELET
Abs Immature Granulocytes: 0.03 10*3/uL (ref 0.00–0.07)
Basophils Absolute: 0 10*3/uL (ref 0.0–0.1)
Basophils Relative: 0 %
Eosinophils Absolute: 0.1 10*3/uL (ref 0.0–0.5)
Eosinophils Relative: 4 %
HCT: 32.5 % — ABNORMAL LOW (ref 39.0–52.0)
Hemoglobin: 10.5 g/dL — ABNORMAL LOW (ref 13.0–17.0)
Immature Granulocytes: 1 %
Lymphocytes Relative: 12 %
Lymphs Abs: 0.4 10*3/uL — ABNORMAL LOW (ref 0.7–4.0)
MCH: 31.4 pg (ref 26.0–34.0)
MCHC: 32.3 g/dL (ref 30.0–36.0)
MCV: 97.3 fL (ref 80.0–100.0)
Monocytes Absolute: 0.6 10*3/uL (ref 0.1–1.0)
Monocytes Relative: 16 %
Neutro Abs: 2.5 10*3/uL (ref 1.7–7.7)
Neutrophils Relative %: 67 %
Platelets: 137 10*3/uL — ABNORMAL LOW (ref 150–400)
RBC: 3.34 MIL/uL — ABNORMAL LOW (ref 4.22–5.81)
RDW: 15.6 % — ABNORMAL HIGH (ref 11.5–15.5)
WBC: 3.7 10*3/uL — ABNORMAL LOW (ref 4.0–10.5)
nRBC: 0 % (ref 0.0–0.2)

## 2021-12-15 LAB — BASIC METABOLIC PANEL
Anion gap: 7 (ref 5–15)
BUN: 31 mg/dL — ABNORMAL HIGH (ref 8–23)
CO2: 24 mmol/L (ref 22–32)
Calcium: 9 mg/dL (ref 8.9–10.3)
Chloride: 106 mmol/L (ref 98–111)
Creatinine, Ser: 2.24 mg/dL — ABNORMAL HIGH (ref 0.61–1.24)
GFR, Estimated: 29 mL/min — ABNORMAL LOW (ref >60)
Glucose, Bld: 159 mg/dL — ABNORMAL HIGH (ref 70–99)
Potassium: 4.3 mmol/L (ref 3.5–5.1)
Sodium: 137 mmol/L (ref 135–145)

## 2021-12-15 MED ORDER — SODIUM CHLORIDE 0.9 % IV BOLUS
1000.0000 mL | Freq: Once | INTRAVENOUS | Status: AC
  Administered 2021-12-15: 1000 mL via INTRAVENOUS

## 2021-12-15 NOTE — Discharge Instructions (Signed)
You were seen in the emergency department for bleeding from a pressure injury area on your buttocks.  The area was not actively bleeding here.  Your lab work showed you to be dehydrated and you were given some IV fluids.  We have placed a referral into physical therapy for wound care.  Please contact your primary care doctor for close follow-up.  They may also help you arrange getting seen in the wound clinic.  Return if any worsening or concerning symptoms

## 2021-12-15 NOTE — ED Triage Notes (Signed)
Per EMS pt has a sore that was bleeding throughout the night. Pt reports it has been developing x3 months. Takes aspirin and Plavix.  CBG 230 BP 140/72 HR 77 SPO2 95%
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
## 2021-12-15 NOTE — ED Provider Notes (Signed)
[facility] EMERGENCY DEPARTMENT Provider Note   CSN: [id number] Arrival date & time: 12/15/21  1101     History  Chief Complaint  Patient presents with   Wound Check    Right buttock    Dave Wright is a 81 y.o. male.  He has a prior history of stroke and that has left him with some limited mobility secondary to unsteady gait and shortness of breath.  He is brought in today from home by ambulance for bleeding from right buttock wound.  He has had pressure injury to both buttock for the last 10 months due to sitting in a recliner a lot of the time.  Today while at [facility] walking he started experiencing some bleeding from his wound.  He denies any fevers chills nausea vomiting.  They have been trying some cream to the area.  It does not sound like to have very much for services at home.  The history is provided by the patient.  Wound Check This is a chronic problem. Episode onset: 10 months. The problem occurs constantly. The problem has not changed since onset.Pertinent negatives include no chest pain, no abdominal pain, no headaches and no shortness of breath. Nothing aggravates the symptoms. Nothing relieves the symptoms. He has tried rest (Antibiotic cream) for the symptoms. The treatment provided no relief.      Home Medications Prior to Admission medications   Medication Sig Start Date End Date Taking? Authorizing Provider  acetaminophen (TYLENOL) 325 MG tablet Take 650 mg by mouth every 6 (six) hours as needed for moderate pain.    [provider]  allopurinol (ZYLOPRIM) 300 MG tablet TAKE 1 TABLET BY MOUTH AT  BEDTIME 11/26/21   McGowen, Adrian Blackwater, MD  aspirin EC 81 MG tablet Take 81 mg by mouth daily. Swallow whole.    [provider]  atorvastatin (LIPITOR) 10 MG tablet Take 1 tablet (10 mg total) by mouth daily. Patient taking differently: Take 10 mg by mouth every evening. 04/04/21 04/04/22  McGowen, Adrian Blackwater, MD  carboxymethylcellulose  (REFRESH PLUS) 0.5 % SOLN Place 1 drop into both eyes 3 (three) times daily as needed (dry eyes).    [provider]  clopidogrel (PLAVIX) 75 MG tablet Take 1 tablet (75 mg total) by mouth daily. 08/14/21   Jacqualine Mau, NP  fenofibrate 54 MG tablet TAKE 2 TABLETS BY MOUTH  DAILY 11/26/21   McGowen, Adrian Blackwater, MD  finasteride (PROSCAR) 5 MG tablet Take 1 tablet (5 mg total) by mouth daily. Patient taking differently: Take 5 mg by mouth every evening. 07/05/21   McGowen, Adrian Blackwater, MD  LANTUS 100 UNIT/ML injection INJECT SUBCUTANEOUSLY 38  UNITS IN THE MORNING AND 18 UNITS IN THE EVENING 10/03/21   McGowen, Adrian Blackwater, MD  magnesium oxide (MAG-OX) 400 MG tablet Take 800 mg by mouth at bedtime.    [provider]  nystatin cream (MYCOSTATIN) Apply 1 application topically 2 (two) times daily. 10/30/21   McGowen, Adrian Blackwater, MD  tamsulosin (FLOMAX) 0.4 MG CAPS capsule Take 2 capsules (0.8 mg total) by mouth daily. 10/03/21   McGowen, Adrian Blackwater, MD      Allergies    Ivp dye [iodinated contrast media], Penicillins, and Ferrous sulfate    Review of Systems   Review of Systems  Constitutional:  Negative for fever.  Respiratory:  Negative for shortness of breath.   Cardiovascular:  Negative for chest pain.  Gastrointestinal:  Negative for abdominal pain.  Genitourinary:  Negative for dysuria.  Musculoskeletal:  Positive for gait problem.  Skin:  Positive for wound.  Neurological:  Positive for weakness. Negative for headaches.   Physical Exam Updated Vital Signs BP (!) 155/69 (BP Location: Right Arm)    Pulse 93    Temp 97.7 F (36.5 C) (Oral)    Resp 18    SpO2 98%  Physical Exam Vitals and nursing note reviewed.  Constitutional:      General: He is not in acute distress.    Appearance: Normal appearance. He is well-developed.  HENT:     Head: Normocephalic and atraumatic.  Eyes:     Conjunctiva/sclera: Conjunctivae normal.  Cardiovascular:     Rate and Rhythm: Normal  rate and regular rhythm.     Heart sounds: No murmur heard. Pulmonary:     Effort: Pulmonary effort is normal. No respiratory distress.     Breath sounds: Normal breath sounds.  Abdominal:     Palpations: Abdomen is soft.     Tenderness: There is no abdominal tenderness. There is no guarding.  Genitourinary:    Comments: He has bilateral buttock pressure injuries.  There is some erythema but minimal induration.  No fluctuance.  No active bleeding.  Picture included below. Musculoskeletal:        General: No swelling.     Cervical back: Neck supple.  Skin:    General: Skin is warm and dry.     Capillary Refill: Capillary refill takes less than 2 seconds.  Neurological:     General: No focal deficit present.     Mental Status: He is alert. Mental status is at baseline.     ED Results / Procedures / Treatments   Labs (all labs ordered are listed, but only abnormal results are displayed) Labs Reviewed  BASIC METABOLIC PANEL - Abnormal; Notable for the following components:      Result Value   Glucose, Bld 159 (*)    BUN 31 (*)    Creatinine, Ser 2.24 (*)    GFR, Estimated 29 (*)    All other components within normal limits  CBC WITH DIFFERENTIAL/PLATELET - Abnormal; Notable for the following components:   WBC 3.7 (*)    RBC 3.34 (*)    Hemoglobin 10.5 (*)    HCT 32.5 (*)    RDW 15.6 (*)    Platelets 137 (*)    Lymphs Abs 0.4 (*)    All other components within normal limits    EKG None  Radiology No results found.  Procedures Procedures    Medications Ordered in ED Medications  sodium chloride 0.9 % bolus 1,000 mL (0 mLs Intravenous Stopped 12/15/21 1415)    ED Course/ Medical Decision Making/ A&P                           Medical Decision Making Amount and/or Complexity of Data Reviewed Labs: ordered.  This patient complains of bleeding from buttock wound; this involves an extensive number of treatment Options and is a complaint that carries with it a  high risk of complications and Morbidity. The differential includes hematoma, decubitus, pressure injury, osteo-  I ordered, reviewed and interpreted labs, which included CBC with low white count low hemoglobin and low platelets not significantly different from prior, chemistries with elevated BUN and creatinine slightly above priors I ordered medication IV fluids Additional history obtained from EMS and patient's wife who confirmed his history Previous records obtained  and reviewed in epic, saw PCP visit about a month and a half ago that did not mention his buttock pressure injury I consulted transitions of care and discussed lab and imaging findings.  They are putting in a referral for home health and wound care and referral to hyperbaric and outpatient wound care  Critical Interventions: None  After the interventions stated above, I reevaluated the patient and found patient to be hemodynamically stable and no evidence of active bleeding.  Discussed work-up so far and he and wife are comfortable plan for outpatient follow-up with PCP and getting some wound care in the house.  Return instructions discussed          Final Clinical Impression(s) / ED Diagnoses Final diagnoses:  Pressure injury of skin of buttock, unspecified injury stage, unspecified laterality  Dehydration  Bleeding from wound    Rx / DC Orders ED Discharge Orders     None         Hayden Rasmussen, MD 12/15/21 2002

## 2021-12-20 ENCOUNTER — Ambulatory Visit (INDEPENDENT_AMBULATORY_CARE_PROVIDER_SITE_OTHER): Payer: Medicare Other | Admitting: Family Medicine

## 2021-12-20 ENCOUNTER — Encounter: Payer: Self-pay | Admitting: Family Medicine

## 2021-12-20 ENCOUNTER — Other Ambulatory Visit: Payer: Self-pay

## 2021-12-20 VITALS — BP 132/66 | HR 80 | Temp 97.4°F | Ht 72.0 in | Wt 217.6 lb

## 2021-12-20 DIAGNOSIS — N183 Chronic kidney disease, stage 3 unspecified: Secondary | ICD-10-CM
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
## 2021-12-20 NOTE — Patient Instructions (Addendum)
Stop nystatin cream. Stop desitin.  Apply aquaphor (over the counter) ointment 2-3 times a day BUT try to leave buttocks area dry, uncovered, and open to the air as much as possible.

## 2021-12-21 LAB — BASIC METABOLIC PANEL
BUN: 29 mg/dL — ABNORMAL HIGH (ref 6–23)
CO2: 28 mEq/L (ref 19–32)
Calcium: 9.1 mg/dL (ref 8.4–10.5)
Chloride: 100 mEq/L (ref 96–112)
Creatinine, Ser: 1.97 mg/dL — ABNORMAL HIGH (ref 0.40–1.50)
GFR: 31.08 mL/min — ABNORMAL LOW (ref >60.00)
Glucose, Bld: 214 mg/dL — ABNORMAL HIGH (ref 70–99)
Potassium: 4.7 mEq/L (ref 3.5–5.1)
Sodium: 133 mEq/L — ABNORMAL LOW (ref 135–145)

## 2022-01-02 ENCOUNTER — Ambulatory Visit (INDEPENDENT_AMBULATORY_CARE_PROVIDER_SITE_OTHER): Payer: Medicare Other | Admitting: Family Medicine

## 2022-01-02 ENCOUNTER — Encounter: Payer: Self-pay | Admitting: Family Medicine

## 2022-01-02 ENCOUNTER — Telehealth: Payer: Self-pay

## 2022-01-02 ENCOUNTER — Other Ambulatory Visit: Payer: Self-pay

## 2022-01-02 VITALS — BP 100/51 | HR 80 | Temp 97.4°F | Ht 74.0 in | Wt 216.0 lb

## 2022-01-02 DIAGNOSIS — E162 Hypoglycemia, unspecified: Secondary | ICD-10-CM

## 2022-01-02 DIAGNOSIS — R809 Proteinuria, unspecified: Secondary | ICD-10-CM | POA: Diagnosis not present

## 2022-01-02 DIAGNOSIS — N401 Enlarged prostate with lower urinary tract symptoms: Secondary | ICD-10-CM

## 2022-01-02 DIAGNOSIS — E1129 Type 2 diabetes mellitus with other diabetic kidney complication: Secondary | ICD-10-CM

## 2022-01-02 DIAGNOSIS — N183 Chronic kidney disease, stage 3 unspecified: Secondary | ICD-10-CM | POA: Diagnosis not present

## 2022-01-02 DIAGNOSIS — Z794 Long term (current) use of insulin: Secondary | ICD-10-CM

## 2022-01-02 DIAGNOSIS — D649 Anemia, unspecified: Secondary | ICD-10-CM | POA: Diagnosis not present

## 2022-01-02 DIAGNOSIS — N138 Other obstructive and reflux uropathy: Secondary | ICD-10-CM

## 2022-01-02 LAB — CBC WITH DIFFERENTIAL/PLATELET
Basophils Absolute: 0 10*3/uL (ref 0.0–0.1)
Basophils Relative: 1 % (ref 0.0–3.0)
Eosinophils Absolute: 0.2 10*3/uL (ref 0.0–0.7)
Eosinophils Relative: 4.6 % (ref 0.0–5.0)
HCT: 32.4 % — ABNORMAL LOW (ref 39.0–52.0)
Hemoglobin: 10.6 g/dL — ABNORMAL LOW (ref 13.0–17.0)
Lymphocytes Relative: 14.6 % (ref 12.0–46.0)
Lymphs Abs: 0.6 10*3/uL — ABNORMAL LOW (ref 0.7–4.0)
MCHC: 32.7 g/dL (ref 30.0–36.0)
MCV: 94.7 fl (ref 78.0–100.0)
Monocytes Absolute: 0.7 10*3/uL (ref 0.1–1.0)
Monocytes Relative: 18 % — ABNORMAL HIGH (ref 3.0–12.0)
Neutro Abs: 2.4 10*3/uL (ref 1.4–7.7)
Neutrophils Relative %: 61.8 % (ref 43.0–77.0)
Platelets: 175 10*3/uL (ref 150.0–400.0)
RBC: 3.43 Mil/uL — ABNORMAL LOW (ref 4.22–5.81)
RDW: 16.7 % — ABNORMAL HIGH (ref 11.5–15.5)
WBC: 3.9 10*3/uL — ABNORMAL LOW (ref 4.0–10.5)

## 2022-01-02 LAB — POCT CBG (FASTING - GLUCOSE)-MANUAL ENTRY: Glucose Fasting, POC: 112 mg/dL — AB (ref 70–99)

## 2022-01-02 LAB — HEMOGLOBIN A1C: Hgb A1c MFr Bld: 6.3 % (ref 4.6–6.5)

## 2022-01-02 MED ORDER — LANTUS 100 UNIT/ML ~~LOC~~ SOLN: SUBCUTANEOUS | 1 refills | Status: DC

## 2022-01-02 MED ORDER — FINASTERIDE 5 MG PO TABS: 5.0000 mg | ORAL_TABLET | Freq: Every day | ORAL | 3 refills | Status: DC
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
## 2022-01-02 NOTE — Telephone Encounter (Signed)
Patient wife calling about Insulin medication.  Patient was just seen today by Dr. Anitra Lauth.  She called insurance and the medication she was checking on, the process are the same.  She said that Dr. Anitra Lauth would know what she was taking about.   Please call [phone].

## 2022-01-02 NOTE — Patient Instructions (Signed)
Decrease Lantus to 36 units every morning. Stop Lantus in the evening. Call insurer to check on coverage for Victoza, Ozempic, and Trulicity. Call us back with information about these.

## 2022-01-03 ENCOUNTER — Telehealth: Payer: Self-pay

## 2022-01-03 LAB — BASIC METABOLIC PANEL
BUN: 33 mg/dL — ABNORMAL HIGH (ref 6–23)
CO2: 24 mEq/L (ref 19–32)
Calcium: 9.1 mg/dL (ref 8.4–10.5)
Chloride: 101 mEq/L (ref 96–112)
Creatinine, Ser: 1.7 mg/dL — ABNORMAL HIGH (ref 0.40–1.50)
GFR: 37.09 mL/min — ABNORMAL LOW (ref >60.00)
Glucose, Bld: 109 mg/dL — ABNORMAL HIGH (ref 70–99)
Potassium: 4.3 mEq/L (ref 3.5–5.1)
Sodium: 135 mEq/L (ref 135–145)

## 2022-01-03 NOTE — Telephone Encounter (Signed)
Pt's wife said they might hold off until March appt. She is concerned about medication affecting his kidneys and would like to work on diet more.

## 2022-01-03 NOTE — Telephone Encounter (Signed)
Patient returning call about lab results.  No one available to take call.  Please call when available [phone]

## 2022-01-03 NOTE — Telephone Encounter (Addendum)
Mentioned in OV note yesterday: Would like to add GLP 1 receptor agonist every morning--they will check with insurer about coverage for Victoza, Ozempic, and Trulicity.   LM for pt to return call. Need to clarify if all medications need prior authorization or if one of three is fully covered by pt's insurance.

## 2022-01-03 NOTE — Telephone Encounter (Signed)
Noted  

## 2022-01-03 NOTE — Telephone Encounter (Signed)
Spoke with pt regarding results/recommendations,voiced understanding. ? ?

## 2022-01-21 ENCOUNTER — Other Ambulatory Visit (HOSPITAL_COMMUNITY): Payer: Self-pay | Admitting: Interventional Radiology

## 2022-01-21 ENCOUNTER — Telehealth (HOSPITAL_COMMUNITY): Payer: Self-pay

## 2022-01-21 DIAGNOSIS — I771 Stricture of artery: Secondary | ICD-10-CM

## 2022-01-21 NOTE — Telephone Encounter (Signed)
Called to schedule mri/mra, no answer, left vm. AW  

## 2022-01-24 ENCOUNTER — Other Ambulatory Visit: Payer: Self-pay | Admitting: Family Medicine

## 2022-01-30 ENCOUNTER — Ambulatory Visit (INDEPENDENT_AMBULATORY_CARE_PROVIDER_SITE_OTHER): Payer: Medicare Other | Admitting: Family Medicine

## 2022-01-30 ENCOUNTER — Encounter: Payer: Self-pay | Admitting: Family Medicine

## 2022-01-30 ENCOUNTER — Other Ambulatory Visit: Payer: Self-pay

## 2022-01-30 VITALS — BP 120/60 | HR 93 | Temp 97.4°F | Ht 74.0 in | Wt 216.2 lb

## 2022-01-30 DIAGNOSIS — E1121 Type 2 diabetes mellitus with diabetic nephropathy: Secondary | ICD-10-CM

## 2022-01-30 DIAGNOSIS — E162 Hypoglycemia, unspecified: Secondary | ICD-10-CM

## 2022-01-30 MED ORDER — LANTUS 100 UNIT/ML ~~LOC~~ SOLN: SUBCUTANEOUS | 1 refills | Status: DC
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
## 2022-01-30 NOTE — Patient Instructions (Signed)
Decrease lantus to 34 units every morning. ?

## 2022-02-11 ENCOUNTER — Other Ambulatory Visit: Payer: Self-pay

## 2022-02-11 ENCOUNTER — Ambulatory Visit (HOSPITAL_COMMUNITY)
Admission: RE | Admit: 2022-02-11 | Discharge: 2022-02-11 | Disposition: A | Discharge status: Home / Self Care | Payer: Medicare Other | Source: Ambulatory Visit | Attending: Interventional Radiology | Admitting: Interventional Radiology

## 2022-02-11 DIAGNOSIS — I6782 Cerebral ischemia: Secondary | ICD-10-CM | POA: Diagnosis not present

## 2022-02-11 DIAGNOSIS — I771 Stricture of artery: Secondary | ICD-10-CM | POA: Diagnosis present

## 2022-02-11 IMAGING — MR MR HEAD W/O CM
8 of 10 series · 35 of 48 positions shown · non-contrast
Comparison: MRI head [DATE].  MRA head and neck [DATE]

CLINICAL DATA: Intracranial stenosis. History of stent angioplasty
distal left vertebral artery [DATE]

EXAM:
MRI HEAD WITHOUT CONTRAST
MRA HEAD WITHOUT CONTRAST
TECHNIQUE: Multiplanar, multi-echo pulse sequences of the brain and surrounding
structures were acquired without intravenous contrast. Angiographic
images of the Circle of Willis were acquired using MRA technique
without intravenous contrast.

[Series 3: DWI · axial · 3.0mm · 1.09mm/px · z∈[-76,+72]mm · 9 of 102 slices shown (1 of 4)]
[im 1/102]
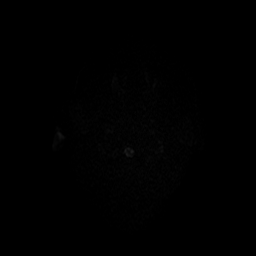
[im 13/102]
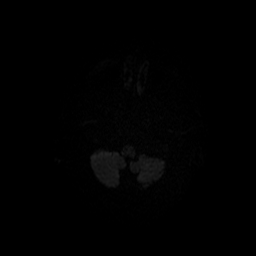
[im 26/102]
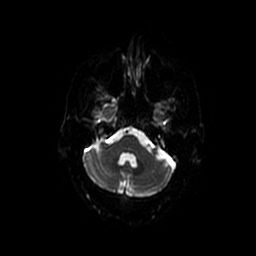
[im 38/102]
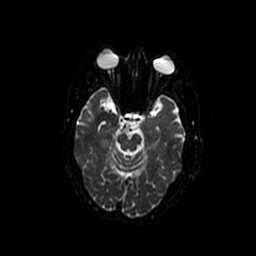
[im 51/102]
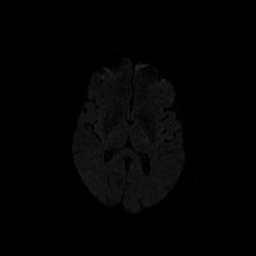
[im 64/102]
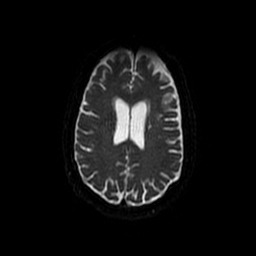
[im 76/102]
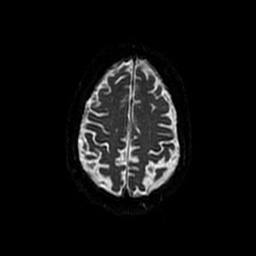
[im 89/102]
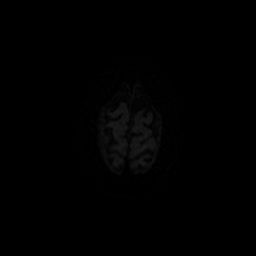
[im 102/102]
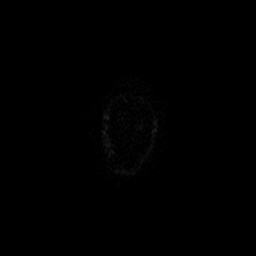

[Series 5: T1 · sagittal · 5.0mm · 0.47mm/px · 1 of 23 slices shown]
[im 1/23]
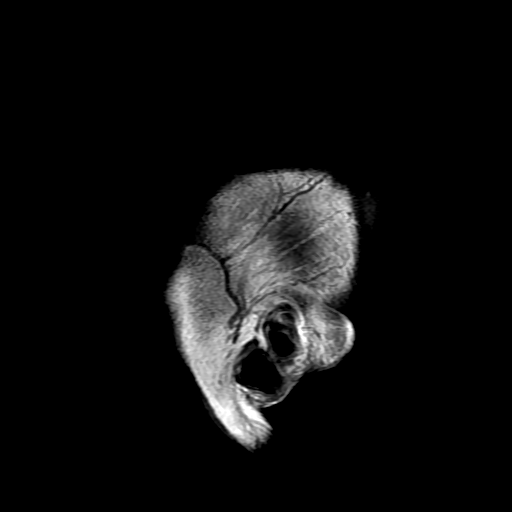

[Series 6: DWI · coronal · 5.0mm · 1.09mm/px · 7 of 74 slices shown (2 of 4)]
[im 1/74]
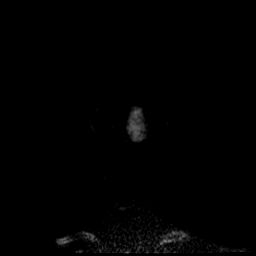
[im 13/74]
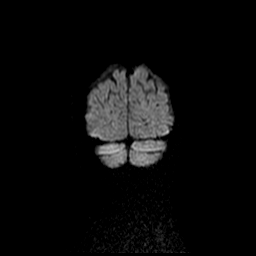
[im 25/74]
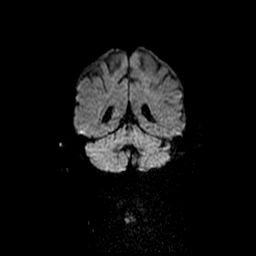
[im 37/74]
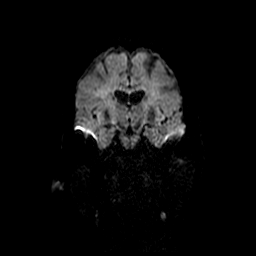
[im 49/74]
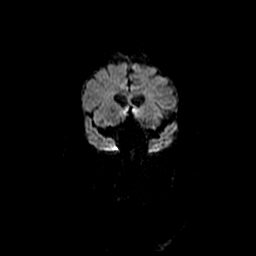
[im 61/74]
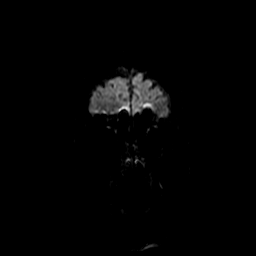
[im 74/74]
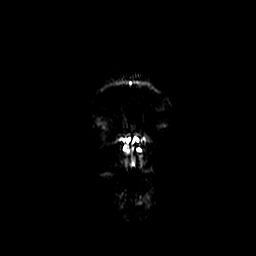

[Series 7: T2 · axial · 5.0mm · 0.43mm/px · z∈[-87,+68]mm · 3 of 27 slices shown (1 of 2)]
[im 1/27]
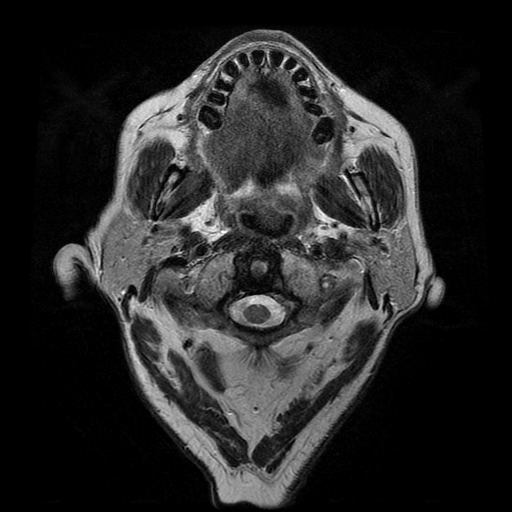
[im 14/27]
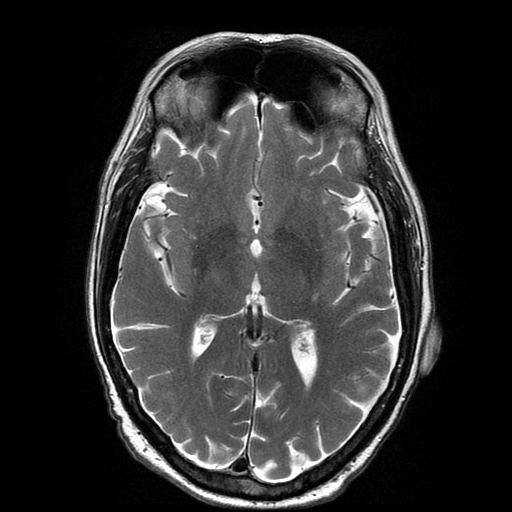
[im 27/27]
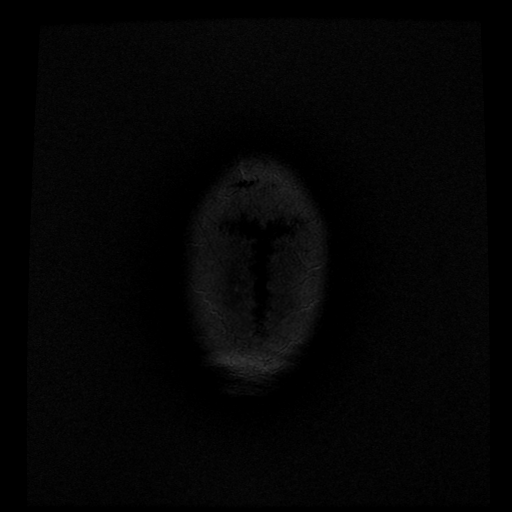

[Series 8: FLAIR · axial · 5.0mm · 0.43mm/px · z∈[-87,+68]mm · 3 of 27 slices shown]
[im 1/27]
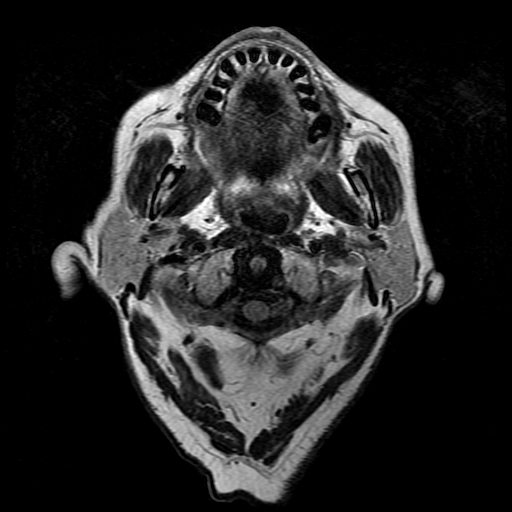
[im 14/27]
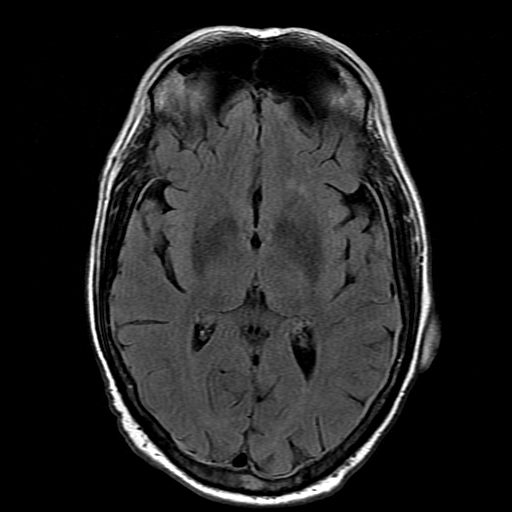
[im 27/27]
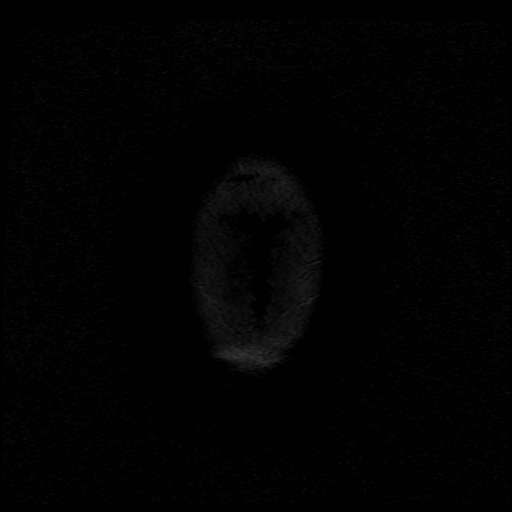

[Series 11: T2 · coronal · 5.0mm · 0.39mm/px · 3 of 28 slices shown (2 of 2)]
[im 1/28]
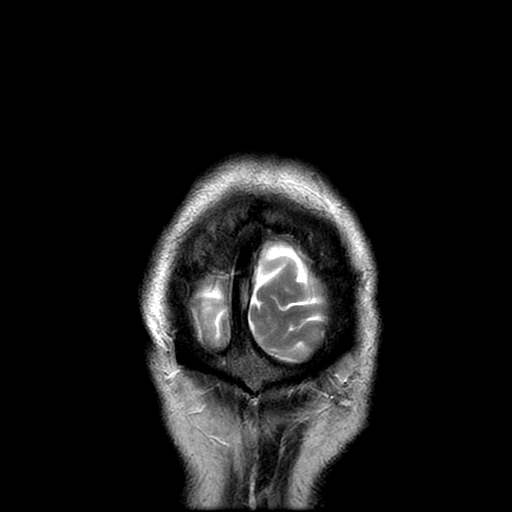
[im 14/28]
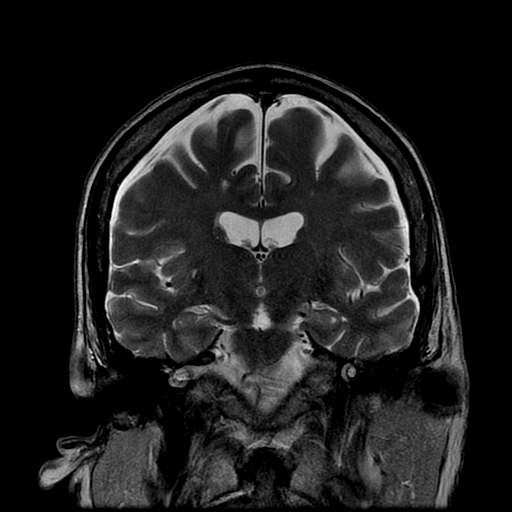
[im 28/28]
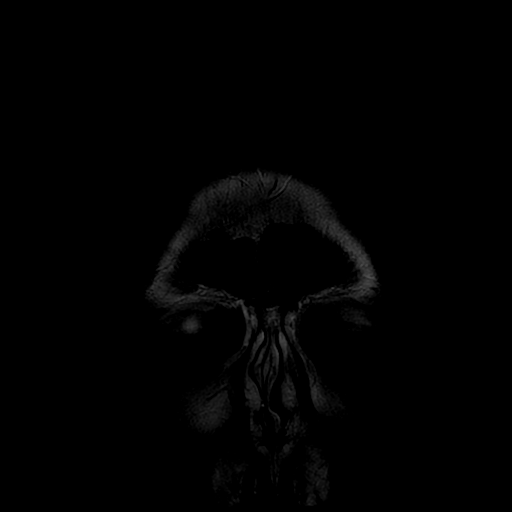

[Series 300: DWI · axial · 3.0mm · 1.09mm/px · z∈[-76,+72]mm · 5 of 51 slices shown (3 of 4)]
[im 1/51]
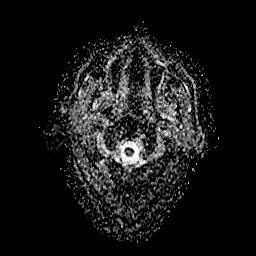
[im 13/51]
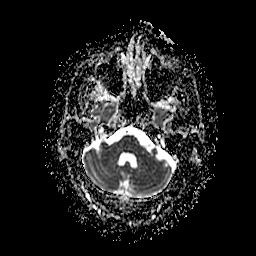
[im 26/51]
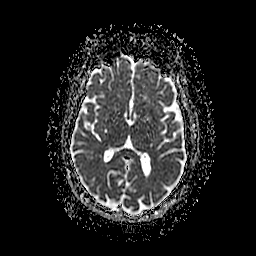
[im 38/51]
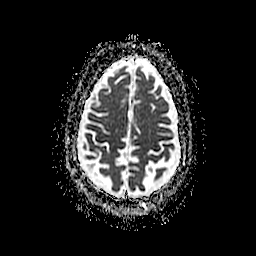
[im 51/51]
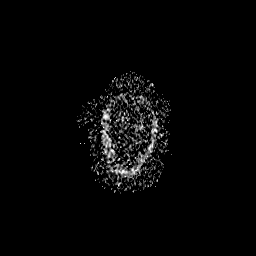

[Series 600: DWI · coronal · 5.0mm · 1.09mm/px · 4 of 37 slices shown (4 of 4)]
[im 1/37]
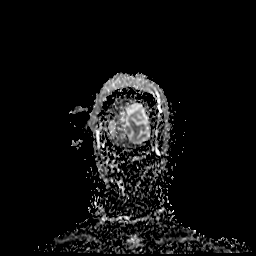
[im 13/37]
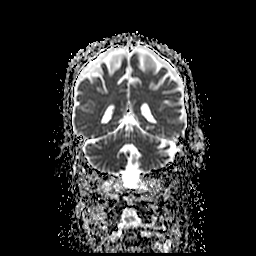
[im 25/37]
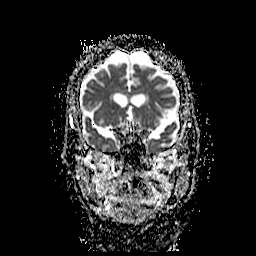
[im 37/37]
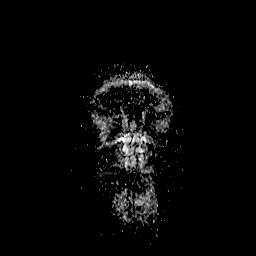

[35 of 48 positions shown; findings below may reference images not displayed]

FINDINGS: MRI HEAD FINDINGS

Brain: Negative for acute infarct. Few small white matter
hyperintensities bilaterally consistent with chronic ischemia.

Small bilateral subdural fluid collections around both cerebral
hemispheres measuring approximately 3 mm in thickness bilaterally.
These were not present on the prior MRI [DATE]. No associated
blood products identified on gradient echo imaging. No intracranial
mass or hydrocephalus.

Vascular: Normal arterial flow voids in the internal carotid artery
bilaterally.

Loss of flow void in the distal right vertebral artery at the skull
base. Stent in the left vertebral artery at the skull base. There is
faint if any flow void above the stent.

Skull and upper cervical spine: No focal skeletal lesion.

Sinuses/Orbits: Paranasal sinuses clear. Bilateral cataract
extraction

Other: None

MRA HEAD FINDINGS

Anterior circulation: Internal carotid artery patent bilaterally
without stenosis. Anterior and middle cerebral arteries patent
bilaterally without stenosis or large vessel occlusion

Posterior circulation: Stent distal left vertebral artery with loss
of flow void through the stent. There is question of flow in the
distal left vertebral artery above the stent although this could be
retrograde flow from the basilar. Left PICA patent. Distal right
vertebral artery appears to be occluded at the skull base as noted
on prior studies.

The basilar has faint flow related signal and may be retrograde flow
from the posterior communicating artery which is patent bilaterally.
Posterior cerebral arteries patent bilaterally.

Anatomic variants: None
IMPRESSION: 1. Negative for acute infarct. Mild chronic microvascular ischemic
changes
2. Interval development of small bilateral subdural fluid
collections without evidence of blood products on gradient echo
imaging. Probable subdural hygromas
3. Stent distal left vertebral artery. There is artifact through the
stent. There is faint flow related signal above the stent which may
be retrograde flow from the basilar. The basilar may have retrograde
flow from the posterior communicating arteries bilaterally
4. Occlusion distal right vertebral artery at the skull base.

## 2022-02-11 IMAGING — MR MR MRA HEAD W/O CM
2 series · 18 of 48 positions shown · non-contrast
Comparison: MRI head [DATE].  MRA head and neck [DATE]

CLINICAL DATA: Intracranial stenosis. History of stent angioplasty
distal left vertebral artery [DATE]

EXAM:
MRI HEAD WITHOUT CONTRAST
MRA HEAD WITHOUT CONTRAST
TECHNIQUE: Multiplanar, multi-echo pulse sequences of the brain and surrounding
structures were acquired without intravenous contrast. Angiographic
images of the Circle of Willis were acquired using MRA technique
without intravenous contrast.

[Series 4: (id) mt fs · axial · 1.4mm · 0.43mm/px · z∈[-81,+30]mm · 17 of 168 slices shown]
[im 1/168]
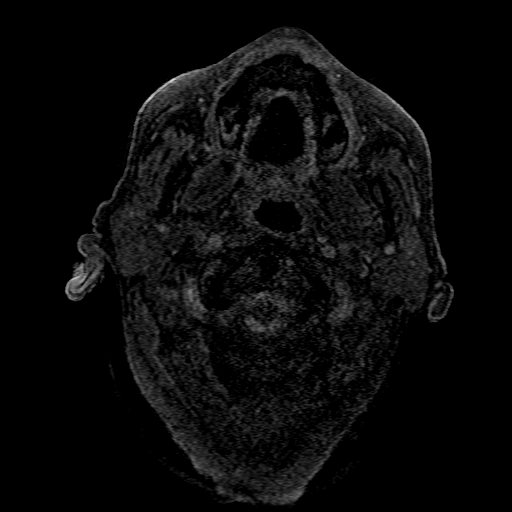
[im 4/168]
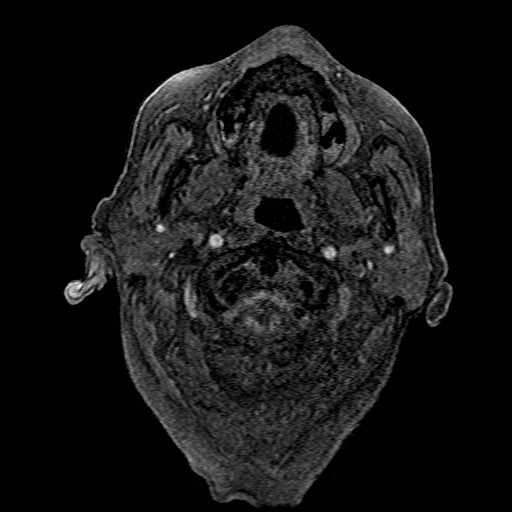
[im 8/168]
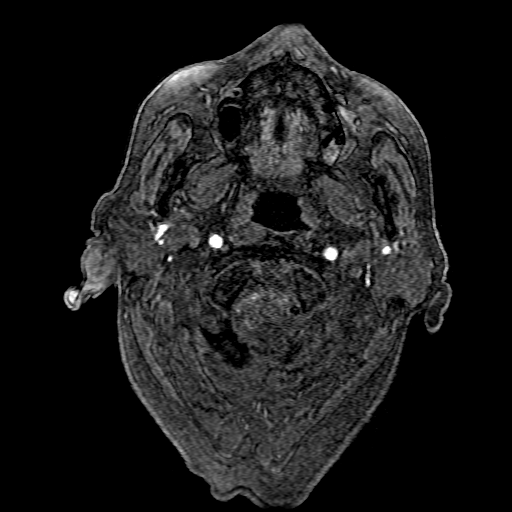
[im 11/168]
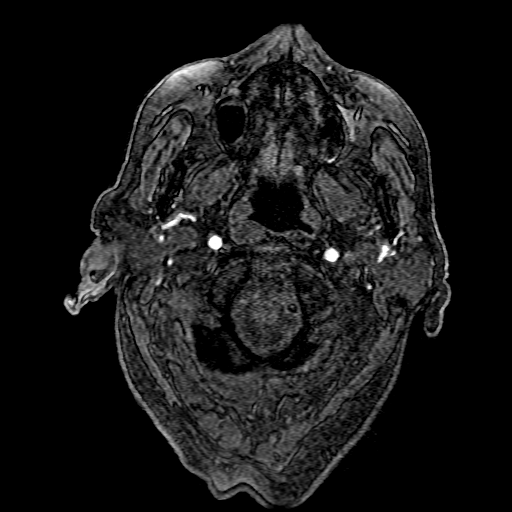
[im 15/168]
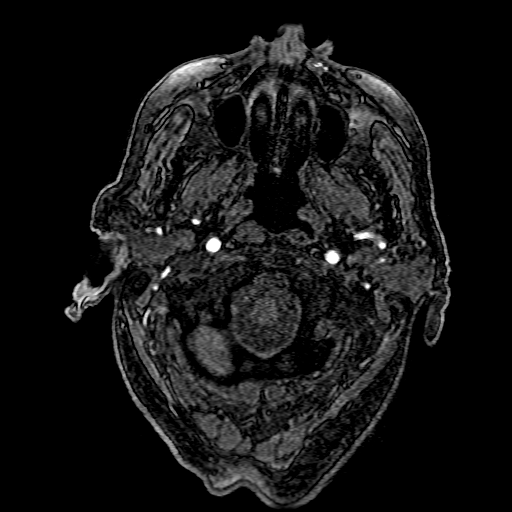
[im 19/168]
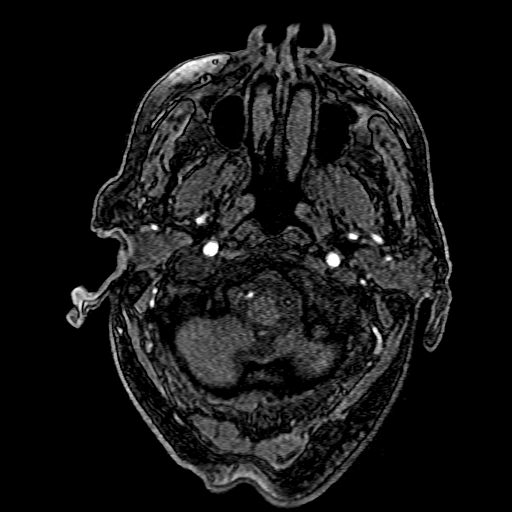
[im 22/168]
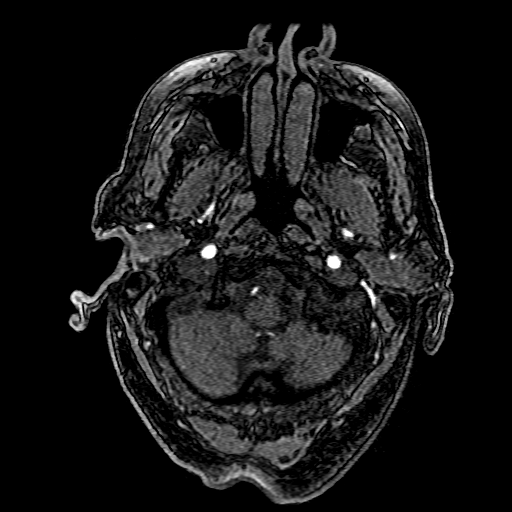
[im 26/168]
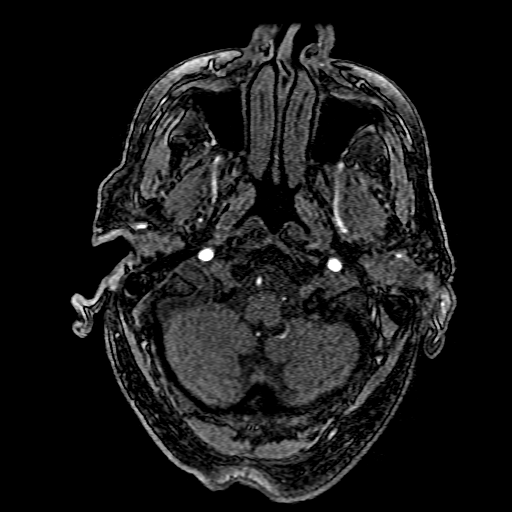
[im 30/168]
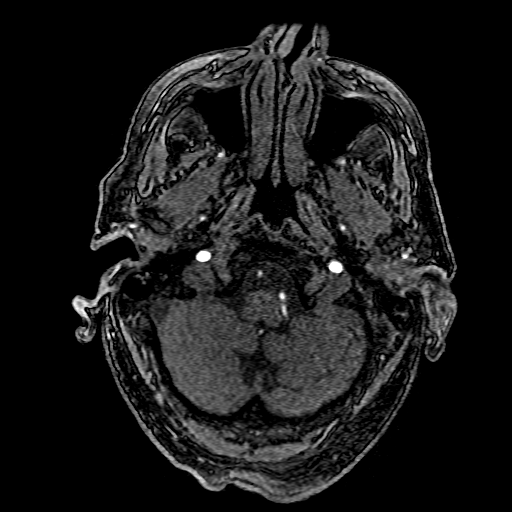
[im 51/168]
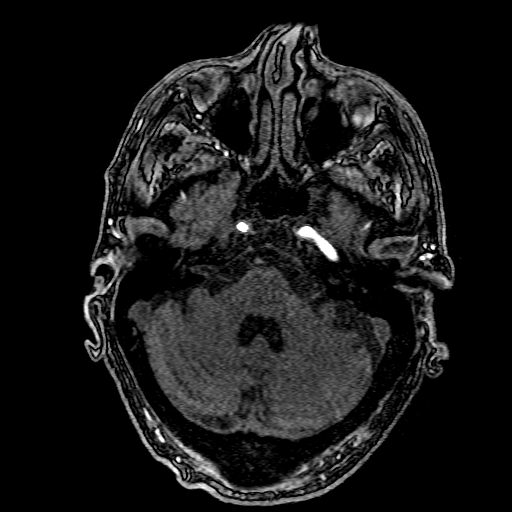
[im 73/168]
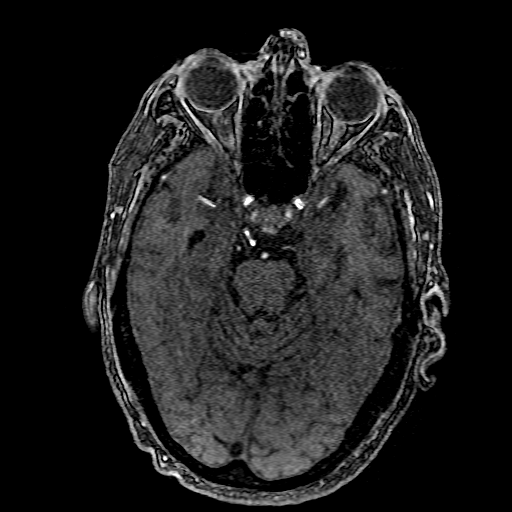
[im 84/168]
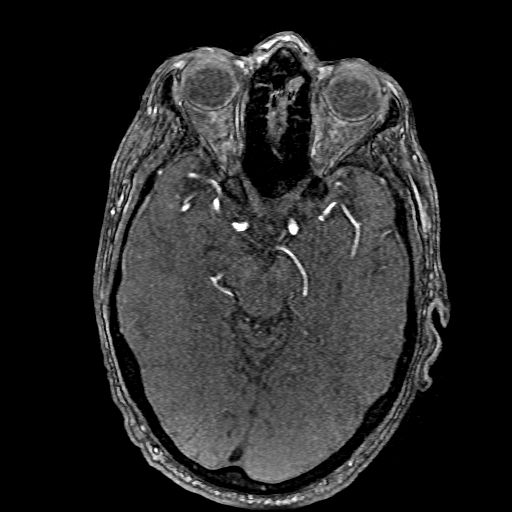
[im 95/168]
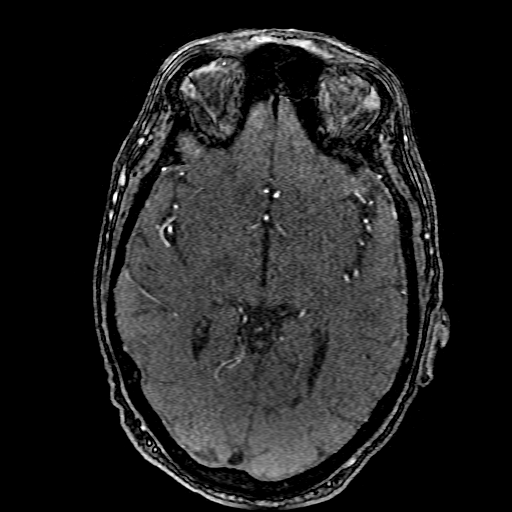
[im 117/168]
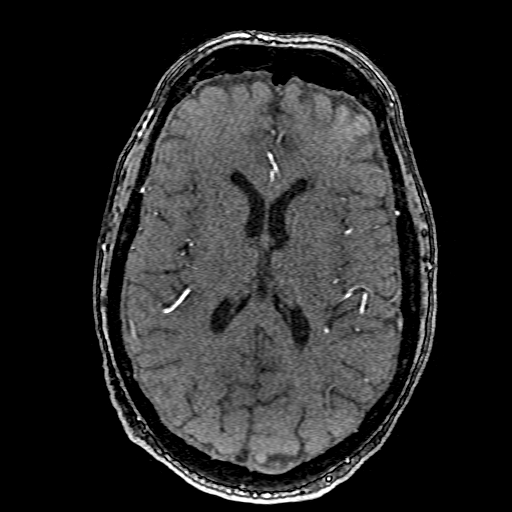
[im 138/168]
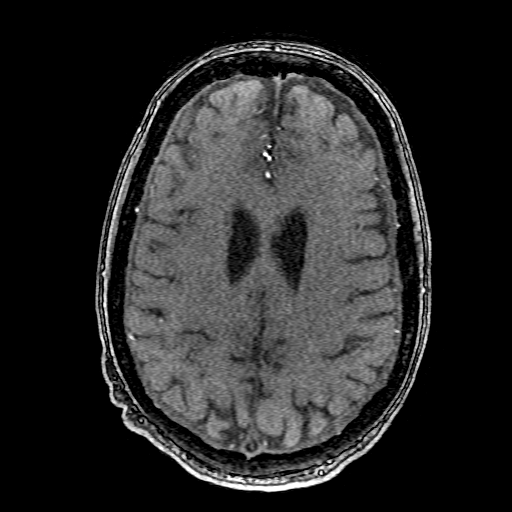
[im 142/168]
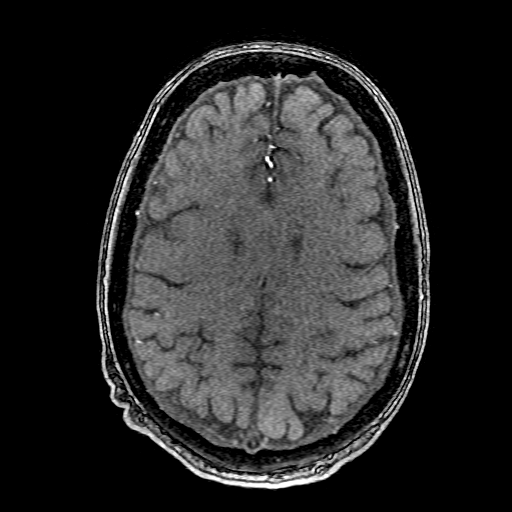
[im 160/168]
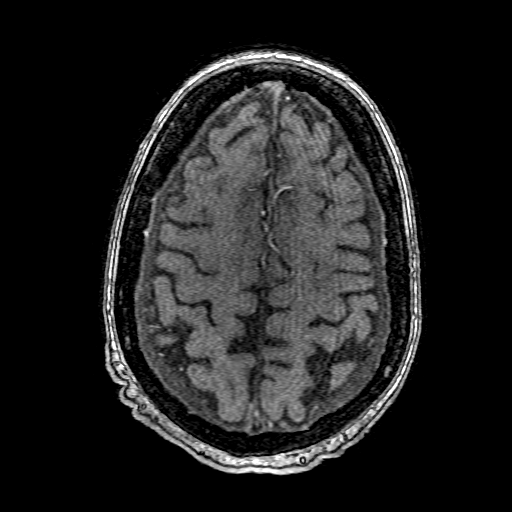

[Series 404: ant. tumble · axial · 1.4mm · 0.43mm/px · 1 of 1 slices shown]
[im 1/1]
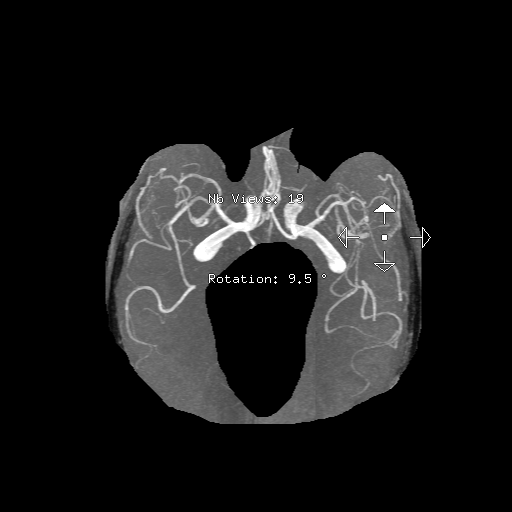

[18 of 48 positions shown; findings below may reference images not displayed]

FINDINGS: MRI HEAD FINDINGS

Brain: Negative for acute infarct. Few small white matter
hyperintensities bilaterally consistent with chronic ischemia.

Small bilateral subdural fluid collections around both cerebral
hemispheres measuring approximately 3 mm in thickness bilaterally.
These were not present on the prior MRI [DATE]. No associated
blood products identified on gradient echo imaging. No intracranial
mass or hydrocephalus.

Vascular: Normal arterial flow voids in the internal carotid artery
bilaterally.

Loss of flow void in the distal right vertebral artery at the skull
base. Stent in the left vertebral artery at the skull base. There is
faint if any flow void above the stent.

Skull and upper cervical spine: No focal skeletal lesion.

Sinuses/Orbits: Paranasal sinuses clear. Bilateral cataract
extraction

Other: None

MRA HEAD FINDINGS

Anterior circulation: Internal carotid artery patent bilaterally
without stenosis. Anterior and middle cerebral arteries patent
bilaterally without stenosis or large vessel occlusion

Posterior circulation: Stent distal left vertebral artery with loss
of flow void through the stent. There is question of flow in the
distal left vertebral artery above the stent although this could be
retrograde flow from the basilar. Left PICA patent. Distal right
vertebral artery appears to be occluded at the skull base as noted
on prior studies.

The basilar has faint flow related signal and may be retrograde flow
from the posterior communicating artery which is patent bilaterally.
Posterior cerebral arteries patent bilaterally.

Anatomic variants: None
IMPRESSION: 1. Negative for acute infarct. Mild chronic microvascular ischemic
changes
2. Interval development of small bilateral subdural fluid
collections without evidence of blood products on gradient echo
imaging. Probable subdural hygromas
3. Stent distal left vertebral artery. There is artifact through the
stent. There is faint flow related signal above the stent which may
be retrograde flow from the basilar. The basilar may have retrograde
flow from the posterior communicating arteries bilaterally
4. Occlusion distal right vertebral artery at the skull base.

## 2022-02-12 ENCOUNTER — Telehealth (HOSPITAL_COMMUNITY): Payer: Self-pay

## 2022-02-12 NOTE — Telephone Encounter (Signed)
Pt's wife called for Plavix refill. I've sent a message to our PA, Roselyn Reef to call this in. AW  ?

## 2022-02-13 ENCOUNTER — Telehealth: Payer: Self-pay | Admitting: Student

## 2022-02-13 MED ORDER — CLOPIDOGREL BISULFATE 75 MG PO TABS: 75.0000 mg | ORAL_TABLET | Freq: Every day | ORAL | 2 refills | Status: DC

## 2022-02-13 NOTE — Telephone Encounter (Signed)
NIR received request to refill plavix. 75 mg x 90 days with 2 refills e-prescribed to the patient's [facility]  in [place]. Patient's wife notified via phone call.  ? Soyla Dryer, AGACNP-BC ?[phone] ?02/13/2022, 10:49 AM ? ? ?

## 2022-02-21 ENCOUNTER — Encounter: Payer: Self-pay | Admitting: Family Medicine

## 2022-02-21 ENCOUNTER — Ambulatory Visit (INDEPENDENT_AMBULATORY_CARE_PROVIDER_SITE_OTHER): Payer: Medicare Other | Admitting: Family Medicine

## 2022-02-21 VITALS — BP 133/67 | HR 91 | Temp 98.3°F | Ht 74.0 in | Wt 216.0 lb

## 2022-02-21 DIAGNOSIS — N1832 Chronic kidney disease, stage 3b: Secondary | ICD-10-CM

## 2022-02-21 DIAGNOSIS — S20229A Contusion of unspecified back wall of thorax, initial encounter: Secondary | ICD-10-CM

## 2022-02-21 DIAGNOSIS — I6503 Occlusion and stenosis of bilateral vertebral arteries: Secondary | ICD-10-CM
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
## 2022-02-22 LAB — BASIC METABOLIC PANEL
BUN/Creatinine Ratio: 22 (calc) (ref 6–22)
BUN: 38 mg/dL — ABNORMAL HIGH (ref 7–25)
CO2: 26 mmol/L (ref 20–32)
Calcium: 9.1 mg/dL (ref 8.6–10.3)
Chloride: 105 mmol/L (ref 98–110)
Creat: 1.74 mg/dL — ABNORMAL HIGH (ref 0.70–1.22)
Glucose, Bld: 202 mg/dL — ABNORMAL HIGH (ref 65–99)
Potassium: 4.5 mmol/L (ref 3.5–5.3)
Sodium: 137 mmol/L (ref 135–146)

## 2022-02-22 LAB — CBC
HCT: 31.2 % — ABNORMAL LOW (ref 38.5–50.0)
Hemoglobin: 10 g/dL — ABNORMAL LOW (ref 13.2–17.1)
MCH: 30.7 pg (ref 27.0–33.0)
MCHC: 32.1 g/dL (ref 32.0–36.0)
MCV: 95.7 fL (ref 80.0–100.0)
MPV: 10.1 fL (ref 7.5–12.5)
Platelets: 194 10*3/uL (ref 140–400)
RBC: 3.26 10*6/uL — ABNORMAL LOW (ref 4.20–5.80)
RDW: 14.4 % (ref 11.0–15.0)
WBC: 3.6 10*3/uL — ABNORMAL LOW (ref 3.8–10.8)

## 2022-02-25 ENCOUNTER — Telehealth (HOSPITAL_COMMUNITY): Payer: Self-pay

## 2022-02-25 NOTE — Telephone Encounter (Signed)
Pt's wife agreed to f/u in 6 months with a cta head/neck. AW  ?

## 2022-02-26 ENCOUNTER — Other Ambulatory Visit: Payer: Self-pay | Admitting: Family Medicine

## 2022-03-04 ENCOUNTER — Other Ambulatory Visit: Payer: Self-pay | Admitting: Family Medicine

## 2022-03-25 ENCOUNTER — Other Ambulatory Visit: Payer: Self-pay | Admitting: Family Medicine

## 2022-03-25 NOTE — Telephone Encounter (Signed)
Has upcoming appt °

## 2022-04-03 ENCOUNTER — Encounter: Payer: Self-pay | Admitting: Family Medicine

## 2022-04-03 ENCOUNTER — Telehealth: Payer: Self-pay

## 2022-04-03 ENCOUNTER — Ambulatory Visit (INDEPENDENT_AMBULATORY_CARE_PROVIDER_SITE_OTHER): Payer: Medicare Other | Admitting: Family Medicine

## 2022-04-03 VITALS — BP 102/57 | HR 78 | Temp 97.5°F | Ht 74.0 in | Wt 209.2 lb

## 2022-04-03 DIAGNOSIS — I6503 Occlusion and stenosis of bilateral vertebral arteries: Secondary | ICD-10-CM

## 2022-04-03 DIAGNOSIS — R42 Dizziness and giddiness: Secondary | ICD-10-CM | POA: Diagnosis not present

## 2022-04-03 DIAGNOSIS — N1831 Chronic kidney disease, stage 3a: Secondary | ICD-10-CM

## 2022-04-03 DIAGNOSIS — E1121 Type 2 diabetes mellitus with diabetic nephropathy: Secondary | ICD-10-CM | POA: Diagnosis not present

## 2022-04-03 LAB — POCT GLYCOSYLATED HEMOGLOBIN (HGB A1C)
HbA1c POC (<> result, manual entry): 5.8 % (ref 4.0–5.6)
HbA1c, POC (controlled diabetic range): 5.8 % (ref 0.0–7.0)
HbA1c, POC (prediabetic range): 5.8 % (ref 5.7–6.4)
Hemoglobin A1C: 5.8 % — AB (ref 4.0–5.6)

## 2022-04-03 MED ORDER — TAMSULOSIN HCL 0.4 MG PO CAPS: 0.8000 mg | ORAL_CAPSULE | Freq: Every day | ORAL | 1 refills | Status: DC

## 2022-04-03 NOTE — Telephone Encounter (Signed)
Spoke with pt to schedule AWV in office. Patient declined to schedule wellness visit at this time.   
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
## 2022-04-29 LAB — IRON,TIBC AND FERRITIN PANEL
%SAT: 17
Ferritin: 143
Iron: 58
TIBC: 345
UIBC: 287

## 2022-04-29 LAB — CBC AND DIFFERENTIAL
HCT: 31 — AB (ref 41–53)
Hemoglobin: 10.4 — AB (ref 13.5–17.5)
Neutrophils Absolute: 1.6
WBC: 2.7

## 2022-04-29 LAB — VITAMIN D 25 HYDROXY (VIT D DEFICIENCY, FRACTURES): Vit D, 25-Hydroxy: 18.6

## 2022-04-29 LAB — BASIC METABOLIC PANEL
BUN: 34 — AB (ref 4–21)
CO2: 27 — AB (ref 13–22)
Chloride: 106 (ref 99–108)
Creatinine: 1.7 — AB (ref 0.6–1.3)
Glucose: 252
Potassium: 4.8 mEq/L (ref 3.5–5.1)
Sodium: 139 (ref 137–147)

## 2022-04-29 LAB — COMPREHENSIVE METABOLIC PANEL
Albumin: 3.8 (ref 3.5–5.0)
Calcium: 9.8 (ref 8.7–10.7)
eGFR: 39

## 2022-04-29 LAB — CBC: RBC: 3.3 — AB (ref 3.87–5.11)

## 2022-05-02 NOTE — Progress Notes (Signed)
6 

## 2022-05-20 ENCOUNTER — Other Ambulatory Visit: Payer: Self-pay | Admitting: Family Medicine

## 2022-05-31 ENCOUNTER — Other Ambulatory Visit: Payer: Self-pay | Admitting: Family Medicine

## 2022-05-31 NOTE — Telephone Encounter (Signed)
Last refill was done by PCP 01/30/22, Please fill, if appropriate.
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
## 2022-06-03 ENCOUNTER — Encounter: Payer: Self-pay | Admitting: Neurology

## 2022-06-03 ENCOUNTER — Ambulatory Visit (INDEPENDENT_AMBULATORY_CARE_PROVIDER_SITE_OTHER): Payer: Medicare Other | Admitting: Neurology

## 2022-06-03 VITALS — BP 126/64 | HR 96 | Ht 73.0 in | Wt 210.2 lb

## 2022-06-03 DIAGNOSIS — I6503 Occlusion and stenosis of bilateral vertebral arteries: Secondary | ICD-10-CM | POA: Diagnosis not present

## 2022-06-03 DIAGNOSIS — I6502 Occlusion and stenosis of left vertebral artery: Secondary | ICD-10-CM

## 2022-06-03 DIAGNOSIS — R29898 Other symptoms and signs involving the musculoskeletal system: Secondary | ICD-10-CM | POA: Diagnosis not present

## 2022-06-03 DIAGNOSIS — G9608 Other cranial cerebrospinal fluid leak: Secondary | ICD-10-CM

## 2022-06-03 NOTE — Patient Instructions (Signed)
Check CT head to follow up on subdural hygromas Physical therapy for bilateral leg weakness Follow up in 6 months.

## 2022-06-22 ENCOUNTER — Telehealth: Payer: Self-pay

## 2022-06-22 NOTE — Telephone Encounter (Signed)
Left message for patient to call office to schedule Medicare Well Visit.  Patient is scheduled to see Dr. Anitra Lauth on 8/23 - added to appt notes to schedule AMV.

## 2022-06-28 ENCOUNTER — Ambulatory Visit
Admission: RE | Admit: 2022-06-28 | Discharge: 2022-06-28 | Disposition: A | Discharge status: Home / Self Care | Payer: Medicare Other | Source: Ambulatory Visit | Attending: Neurology | Admitting: Neurology

## 2022-06-28 DIAGNOSIS — G9608 Other cranial cerebrospinal fluid leak: Secondary | ICD-10-CM

## 2022-07-02 ENCOUNTER — Other Ambulatory Visit: Payer: Self-pay

## 2022-07-02 DIAGNOSIS — R42 Dizziness and giddiness: Secondary | ICD-10-CM

## 2022-07-02 DIAGNOSIS — I6502 Occlusion and stenosis of left vertebral artery: Secondary | ICD-10-CM

## 2022-07-02 DIAGNOSIS — G9608 Other cranial cerebrospinal fluid leak: Secondary | ICD-10-CM

## 2022-07-02 DIAGNOSIS — R9089 Other abnormal findings on diagnostic imaging of central nervous system: Secondary | ICD-10-CM

## 2022-07-03 ENCOUNTER — Ambulatory Visit: Payer: Medicare Other | Admitting: Family Medicine

## 2022-07-10 ENCOUNTER — Encounter: Payer: Self-pay | Admitting: Family Medicine

## 2022-07-10 ENCOUNTER — Ambulatory Visit (INDEPENDENT_AMBULATORY_CARE_PROVIDER_SITE_OTHER): Payer: Medicare Other | Admitting: Family Medicine

## 2022-07-10 VITALS — BP 102/56 | HR 84 | Temp 97.6°F | Ht 73.0 in | Wt 205.8 lb

## 2022-07-10 DIAGNOSIS — R296 Repeated falls: Secondary | ICD-10-CM

## 2022-07-10 DIAGNOSIS — R531 Weakness: Secondary | ICD-10-CM | POA: Diagnosis not present

## 2022-07-10 DIAGNOSIS — E1121 Type 2 diabetes mellitus with diabetic nephropathy: Secondary | ICD-10-CM

## 2022-07-10 DIAGNOSIS — N1831 Chronic kidney disease, stage 3a: Secondary | ICD-10-CM

## 2022-07-10 DIAGNOSIS — I6503 Occlusion and stenosis of bilateral vertebral arteries: Secondary | ICD-10-CM

## 2022-07-10 DIAGNOSIS — I959 Hypotension, unspecified: Secondary | ICD-10-CM

## 2022-07-10 DIAGNOSIS — I679 Cerebrovascular disease, unspecified: Secondary | ICD-10-CM

## 2022-07-10 LAB — BASIC METABOLIC PANEL
BUN: 36 mg/dL — ABNORMAL HIGH (ref 6–23)
CO2: 25 mEq/L (ref 19–32)
Calcium: 9.7 mg/dL (ref 8.4–10.5)
Chloride: 104 mEq/L (ref 96–112)
Creatinine, Ser: 2 mg/dL — ABNORMAL HIGH (ref 0.40–1.50)
GFR: 30.4 mL/min — ABNORMAL LOW (ref >60.00)
Glucose, Bld: 126 mg/dL — ABNORMAL HIGH (ref 70–99)
Potassium: 4.4 mEq/L (ref 3.5–5.1)
Sodium: 139 mEq/L (ref 135–145)

## 2022-07-10 LAB — POCT GLYCOSYLATED HEMOGLOBIN (HGB A1C)
HbA1c POC (<> result, manual entry): 5.9 % (ref 4.0–5.6)
HbA1c, POC (controlled diabetic range): 5.9 % (ref 0.0–7.0)
HbA1c, POC (prediabetic range): 5.9 % (ref 5.7–6.4)
Hemoglobin A1C: 5.9 % — AB (ref 4.0–5.6)

## 2022-07-10 MED ORDER — FLUDROCORTISONE ACETATE 0.1 MG PO TABS: 0.1000 mg | ORAL_TABLET | Freq: Every day | ORAL | 0 refills | Status: DC
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
## 2022-07-12 ENCOUNTER — Ambulatory Visit
Admission: RE | Admit: 2022-07-12 | Discharge: 2022-07-12 | Disposition: A | Discharge status: Home / Self Care | Payer: Medicare Other | Source: Ambulatory Visit | Attending: Neurology | Admitting: Neurology

## 2022-07-12 ENCOUNTER — Other Ambulatory Visit: Payer: Self-pay | Admitting: *Deleted

## 2022-07-12 DIAGNOSIS — I6502 Occlusion and stenosis of left vertebral artery: Secondary | ICD-10-CM

## 2022-07-12 DIAGNOSIS — R9089 Other abnormal findings on diagnostic imaging of central nervous system: Secondary | ICD-10-CM

## 2022-07-12 DIAGNOSIS — G9608 Other cranial cerebrospinal fluid leak: Secondary | ICD-10-CM

## 2022-07-12 DIAGNOSIS — R42 Dizziness and giddiness: Secondary | ICD-10-CM

## 2022-07-12 NOTE — Patient Outreach (Signed)
  Care Coordination   07/12/2022 Name: Dave Wright MRN: [id number] DOB: [birth year]   Care Coordination Outreach Attempts:  An unsuccessful telephone outreach was attempted today to offer the patient information about available care coordination services as a benefit of their health plan.   Follow Up Plan:  Additional outreach attempts will be made to offer the patient care coordination information and services.   Encounter Outcome:  No Answer  Care Coordination Interventions Activated:  No   Care Coordination Interventions:  No, not indicated    Raina Mina, RN Care Management Coordinator [place] Office [phone]

## 2022-07-24 ENCOUNTER — Encounter: Payer: Self-pay | Admitting: Family Medicine

## 2022-07-24 ENCOUNTER — Ambulatory Visit (INDEPENDENT_AMBULATORY_CARE_PROVIDER_SITE_OTHER): Payer: Medicare Other | Admitting: Family Medicine

## 2022-07-24 VITALS — BP 110/70 | HR 75 | Temp 98.1°F | Ht 73.0 in | Wt 218.8 lb

## 2022-07-24 DIAGNOSIS — I951 Orthostatic hypotension: Secondary | ICD-10-CM

## 2022-07-24 DIAGNOSIS — Z23 Encounter for immunization: Secondary | ICD-10-CM

## 2022-07-24 DIAGNOSIS — I6503 Occlusion and stenosis of bilateral vertebral arteries: Secondary | ICD-10-CM

## 2022-07-24 LAB — BASIC METABOLIC PANEL
BUN: 30 mg/dL — ABNORMAL HIGH (ref 6–23)
CO2: 26 mEq/L (ref 19–32)
Calcium: 9.1 mg/dL (ref 8.4–10.5)
Chloride: 103 mEq/L (ref 96–112)
Creatinine, Ser: 1.83 mg/dL — ABNORMAL HIGH (ref 0.40–1.50)
GFR: 33.81 mL/min — ABNORMAL LOW (ref >60.00)
Glucose, Bld: 198 mg/dL — ABNORMAL HIGH (ref 70–99)
Potassium: 4.1 mEq/L (ref 3.5–5.1)
Sodium: 137 mEq/L (ref 135–145)

## 2022-07-24 MED ORDER — FLUDROCORTISONE ACETATE 0.1 MG PO TABS: 0.2000 mg | ORAL_TABLET | Freq: Every day | ORAL | 0 refills | Status: DC
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
## 2022-07-31 ENCOUNTER — Ambulatory Visit: Payer: Self-pay

## 2022-07-31 NOTE — Patient Outreach (Signed)
  Care Coordination   Initial Visit Note   07/31/2022 Name: TRAYVEON BECKFORD MRN: [id number] DOB: [birth year]  VINICIO LYNK is a 81 y.o. year old male who sees McGowen, Adrian Blackwater, MD for primary care. I  spoke with patients spouse by phone today.  What matters to the patients health and wellness today?  Declined to participate in call    Goals Addressed   None     SDOH assessments and interventions completed:  No     Care Coordination Interventions Activated:  No  Care Coordination Interventions:  No, not indicated   Follow up plan: No further intervention required.   Encounter Outcome:  Pt. Refused   Daneen Schick, BSW, CDP Social Worker, Certified Dementia Practitioner [place] [place] [facility]  Care Coordination [phone]

## 2022-08-06 ENCOUNTER — Telehealth: Payer: Self-pay | Admitting: Family Medicine

## 2022-08-06 NOTE — Telephone Encounter (Signed)
Spoke with patient spouse she declined AWV she stated that patient would be doing any AWV due to his condition and illiness.  She req not to call for AWV

## 2022-08-07 ENCOUNTER — Ambulatory Visit (INDEPENDENT_AMBULATORY_CARE_PROVIDER_SITE_OTHER): Payer: Medicare Other | Admitting: Family Medicine

## 2022-08-07 ENCOUNTER — Telehealth: Payer: Self-pay

## 2022-08-07 ENCOUNTER — Encounter: Payer: Self-pay | Admitting: Family Medicine

## 2022-08-07 VITALS — BP 130/60 | HR 88 | Temp 97.0°F | Ht 73.0 in | Wt 233.4 lb

## 2022-08-07 DIAGNOSIS — I951 Orthostatic hypotension: Secondary | ICD-10-CM

## 2022-08-07 DIAGNOSIS — I6503 Occlusion and stenosis of bilateral vertebral arteries: Secondary | ICD-10-CM

## 2022-08-07 DIAGNOSIS — N183 Chronic kidney disease, stage 3 unspecified: Secondary | ICD-10-CM

## 2022-08-07 DIAGNOSIS — R0609 Other forms of dyspnea: Secondary | ICD-10-CM

## 2022-08-07 DIAGNOSIS — R600 Localized edema: Secondary | ICD-10-CM

## 2022-08-07 DIAGNOSIS — R052 Subacute cough: Secondary | ICD-10-CM

## 2022-08-07 LAB — MICROALBUMIN / CREATININE URINE RATIO
Creatinine,U: 139.3 mg/dL
Microalb Creat Ratio: 0.7 mg/g (ref 0.0–30.0)
Microalb, Ur: 1 mg/dL (ref 0.0–1.9)

## 2022-08-07 MED ORDER — FLUDROCORTISONE ACETATE 0.1 MG PO TABS
0.1000 mg | ORAL_TABLET | Freq: Every day | ORAL | 1 refills | Status: DC
Start: 2022-08-07 — End: 2022-08-21

## 2022-08-07 NOTE — Telephone Encounter (Signed)
Spoke with pt to schedule AWV in office. Patient declined to schedule wellness visit at this time.   
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
## 2022-08-08 ENCOUNTER — Ambulatory Visit (HOSPITAL_COMMUNITY): Payer: Medicare Other | Attending: Family Medicine

## 2022-08-08 ENCOUNTER — Telehealth: Payer: Self-pay | Admitting: Family Medicine

## 2022-08-08 ENCOUNTER — Encounter: Payer: Self-pay | Admitting: Family Medicine

## 2022-08-08 DIAGNOSIS — R052 Subacute cough: Secondary | ICD-10-CM

## 2022-08-08 DIAGNOSIS — R600 Localized edema: Secondary | ICD-10-CM

## 2022-08-08 DIAGNOSIS — R0609 Other forms of dyspnea: Secondary | ICD-10-CM

## 2022-08-08 HISTORY — PX: TRANSTHORACIC ECHOCARDIOGRAM: SHX275

## 2022-08-08 LAB — ECHOCARDIOGRAM COMPLETE
Area-P 1/2: 2.65 cm2
S' Lateral: 1.9 cm

## 2022-08-08 NOTE — Telephone Encounter (Signed)
Per Dr. Anitra Lauth notes I informed Dave Wright her  husband Dave Wright results showed no protein in his urine.

## 2022-08-14 ENCOUNTER — Ambulatory Visit (INDEPENDENT_AMBULATORY_CARE_PROVIDER_SITE_OTHER): Payer: Medicare Other | Admitting: Family Medicine

## 2022-08-14 ENCOUNTER — Encounter: Payer: Self-pay | Admitting: Family Medicine

## 2022-08-14 VITALS — BP 161/70 | HR 79 | Temp 98.0°F | Ht 73.0 in | Wt 231.8 lb

## 2022-08-14 DIAGNOSIS — I6503 Occlusion and stenosis of bilateral vertebral arteries: Secondary | ICD-10-CM

## 2022-08-14 DIAGNOSIS — R1900 Intra-abdominal and pelvic swelling, mass and lump, unspecified site: Secondary | ICD-10-CM | POA: Diagnosis not present

## 2022-08-14 DIAGNOSIS — R609 Edema, unspecified: Secondary | ICD-10-CM

## 2022-08-14 DIAGNOSIS — R600 Localized edema: Secondary | ICD-10-CM | POA: Diagnosis not present

## 2022-08-14 MED ORDER — FUROSEMIDE 20 MG PO TABS: 20.0000 mg | ORAL_TABLET | Freq: Every day | ORAL | 0 refills | Status: DC
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
## 2022-08-15 ENCOUNTER — Other Ambulatory Visit: Payer: Self-pay | Admitting: Family Medicine

## 2022-08-16 ENCOUNTER — Other Ambulatory Visit: Payer: Medicare Other

## 2022-08-16 ENCOUNTER — Other Ambulatory Visit: Payer: Self-pay | Admitting: Family Medicine

## 2022-08-16 DIAGNOSIS — R600 Localized edema: Secondary | ICD-10-CM

## 2022-08-17 LAB — BASIC METABOLIC PANEL
BUN/Creatinine Ratio: 13 (calc) (ref 6–22)
BUN: 27 mg/dL — ABNORMAL HIGH (ref 7–25)
CO2: 27 mmol/L (ref 20–32)
Calcium: 9.3 mg/dL (ref 8.6–10.3)
Chloride: 104 mmol/L (ref 98–110)
Creat: 2.03 mg/dL — ABNORMAL HIGH (ref 0.70–1.22)
Glucose, Bld: 198 mg/dL — ABNORMAL HIGH (ref 65–99)
Potassium: 4.8 mmol/L (ref 3.5–5.3)
Sodium: 139 mmol/L (ref 135–146)

## 2022-08-19 ENCOUNTER — Ambulatory Visit (HOSPITAL_BASED_OUTPATIENT_CLINIC_OR_DEPARTMENT_OTHER)
Admission: RE | Admit: 2022-08-19 | Discharge: 2022-08-19 | Disposition: A | Discharge status: Home / Self Care | Payer: Medicare Other | Source: Ambulatory Visit | Attending: Family Medicine | Admitting: Family Medicine

## 2022-08-19 DIAGNOSIS — R0609 Other forms of dyspnea: Secondary | ICD-10-CM | POA: Diagnosis present

## 2022-08-19 DIAGNOSIS — R609 Edema, unspecified: Secondary | ICD-10-CM | POA: Insufficient documentation

## 2022-08-19 DIAGNOSIS — R600 Localized edema: Secondary | ICD-10-CM | POA: Diagnosis present

## 2022-08-19 DIAGNOSIS — R1900 Intra-abdominal and pelvic swelling, mass and lump, unspecified site: Secondary | ICD-10-CM | POA: Diagnosis present

## 2022-08-19 DIAGNOSIS — R052 Subacute cough: Secondary | ICD-10-CM | POA: Diagnosis present

## 2022-08-21 ENCOUNTER — Ambulatory Visit (INDEPENDENT_AMBULATORY_CARE_PROVIDER_SITE_OTHER): Payer: Medicare Other | Admitting: Family Medicine

## 2022-08-21 ENCOUNTER — Encounter: Payer: Self-pay | Admitting: Family Medicine

## 2022-08-21 VITALS — BP 135/67 | HR 82 | Temp 97.9°F | Ht 73.0 in | Wt 216.8 lb

## 2022-08-21 DIAGNOSIS — I6503 Occlusion and stenosis of bilateral vertebral arteries: Secondary | ICD-10-CM

## 2022-08-21 DIAGNOSIS — E162 Hypoglycemia, unspecified: Secondary | ICD-10-CM

## 2022-08-21 DIAGNOSIS — N184 Chronic kidney disease, stage 4 (severe): Secondary | ICD-10-CM

## 2022-08-21 DIAGNOSIS — R609 Edema, unspecified: Secondary | ICD-10-CM

## 2022-08-21 DIAGNOSIS — R600 Localized edema: Secondary | ICD-10-CM
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
## 2022-08-22 LAB — COMPREHENSIVE METABOLIC PANEL
ALT: 25 U/L (ref 0–53)
AST: 46 U/L — ABNORMAL HIGH (ref 0–37)
Albumin: 3.6 g/dL (ref 3.5–5.2)
Alkaline Phosphatase: 79 U/L (ref 39–117)
BUN: 32 mg/dL — ABNORMAL HIGH (ref 6–23)
CO2: 29 mEq/L (ref 19–32)
Calcium: 9.3 mg/dL (ref 8.4–10.5)
Chloride: 102 mEq/L (ref 96–112)
Creatinine, Ser: 2 mg/dL — ABNORMAL HIGH (ref 0.40–1.50)
GFR: 30.38 mL/min — ABNORMAL LOW (ref >60.00)
Glucose, Bld: 208 mg/dL — ABNORMAL HIGH (ref 70–99)
Potassium: 4.4 mEq/L (ref 3.5–5.1)
Sodium: 137 mEq/L (ref 135–145)
Total Bilirubin: 0.7 mg/dL (ref 0.2–1.2)
Total Protein: 6 g/dL (ref 6.0–8.3)

## 2022-08-22 LAB — MICROALBUMIN / CREATININE URINE RATIO
Creatinine,U: 137.2 mg/dL
Microalb Creat Ratio: 0.7 mg/g (ref 0.0–30.0)
Microalb, Ur: 1 mg/dL (ref 0.0–1.9)

## 2022-08-23 ENCOUNTER — Telehealth: Payer: Self-pay | Admitting: Family Medicine

## 2022-08-23 ENCOUNTER — Telehealth: Payer: Self-pay

## 2022-08-23 MED ORDER — FUROSEMIDE 20 MG PO TABS: 20.0000 mg | ORAL_TABLET | ORAL | 1 refills | Status: DC

## 2022-08-23 NOTE — Telephone Encounter (Signed)
-----   Message from Tammi Sou, MD sent at 08/22/2022  4:58 PM EDT ----- Kidney function is stable.  Electrolytes are normal. Continue Lasix 20 mg every other day.  Please send prescription for Lasix 20 mg, 1 tab p.o. every other day, #45, refill x1.

## 2022-08-23 NOTE — Telephone Encounter (Signed)
Noted  

## 2022-08-23 NOTE — Telephone Encounter (Signed)
I informed patients wife of results and instructions. She was fine and did not have any additional information.

## 2022-09-04 ENCOUNTER — Ambulatory Visit (INDEPENDENT_AMBULATORY_CARE_PROVIDER_SITE_OTHER): Payer: Medicare Other | Admitting: Family Medicine

## 2022-09-04 ENCOUNTER — Encounter: Payer: Self-pay | Admitting: Family Medicine

## 2022-09-04 VITALS — BP 110/57 | HR 90 | Temp 98.0°F | Ht 73.0 in | Wt 208.0 lb

## 2022-09-04 DIAGNOSIS — I951 Orthostatic hypotension: Secondary | ICD-10-CM

## 2022-09-04 DIAGNOSIS — I6503 Occlusion and stenosis of bilateral vertebral arteries: Secondary | ICD-10-CM

## 2022-09-04 DIAGNOSIS — N1832 Chronic kidney disease, stage 3b: Secondary | ICD-10-CM

## 2022-09-04 DIAGNOSIS — E1121 Type 2 diabetes mellitus with diabetic nephropathy: Secondary | ICD-10-CM

## 2022-09-04 DIAGNOSIS — E162 Hypoglycemia, unspecified: Secondary | ICD-10-CM | POA: Diagnosis not present

## 2022-09-04 DIAGNOSIS — R600 Localized edema: Secondary | ICD-10-CM

## 2022-09-04 LAB — BASIC METABOLIC PANEL
BUN: 37 mg/dL — ABNORMAL HIGH (ref 6–23)
CO2: 27 mEq/L (ref 19–32)
Calcium: 9.6 mg/dL (ref 8.4–10.5)
Chloride: 103 mEq/L (ref 96–112)
Creatinine, Ser: 2.01 mg/dL — ABNORMAL HIGH (ref 0.40–1.50)
GFR: 30.19 mL/min — ABNORMAL LOW (ref >60.00)
Glucose, Bld: 166 mg/dL — ABNORMAL HIGH (ref 70–99)
Potassium: 4.5 mEq/L (ref 3.5–5.1)
Sodium: 137 mEq/L (ref 135–145)

## 2022-09-04 MED ORDER — LIDOCAINE VISCOUS HCL 2 % MT SOLN: 5.0000 mL | Freq: Three times a day (TID) | OROMUCOSAL | 1 refills | Status: DC
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
## 2022-09-05 ENCOUNTER — Telehealth: Payer: Self-pay

## 2022-09-05 NOTE — Telephone Encounter (Signed)
Noted  

## 2022-09-05 NOTE — Telephone Encounter (Signed)
Patient wife returning call about lab results.  Told her the following from Dr. Anitra Lauth.  Copied and pasted: "Electrolytes are normal.  Kidney function is unchanged. Continue Lasix 20 mg every other day."  No questions/concerns at this time.

## 2022-09-18 ENCOUNTER — Telehealth: Payer: Self-pay

## 2022-09-18 MED ORDER — LIDOCAINE VISCOUS HCL 2 % MT SOLN: OROMUCOSAL | 3 refills | Status: DC

## 2022-09-18 NOTE — Telephone Encounter (Signed)
Spoke with patient regarding results/recommendations.  

## 2022-09-18 NOTE — Telephone Encounter (Signed)
Okay, lidocaine prescription sent

## 2022-09-18 NOTE — Telephone Encounter (Signed)
Patient wife (DPR) called about magic mouth wash prescriptions. Patient needs a refill, however, [facility] cannot mix medication; they suggested for provider to write prescription for just the Lidocaine, and she is able to mix the other 2 with lidocaine.  Please call [phone]

## 2022-10-16 ENCOUNTER — Ambulatory Visit (INDEPENDENT_AMBULATORY_CARE_PROVIDER_SITE_OTHER): Payer: Medicare Other | Admitting: Family Medicine

## 2022-10-16 ENCOUNTER — Encounter: Payer: Self-pay | Admitting: Family Medicine

## 2022-10-16 VITALS — BP 124/69 | HR 74 | Temp 97.6°F | Ht 73.0 in | Wt 205.8 lb

## 2022-10-16 DIAGNOSIS — R600 Localized edema: Secondary | ICD-10-CM | POA: Diagnosis not present

## 2022-10-16 DIAGNOSIS — N1832 Chronic kidney disease, stage 3b: Secondary | ICD-10-CM

## 2022-10-16 DIAGNOSIS — E1121 Type 2 diabetes mellitus with diabetic nephropathy: Secondary | ICD-10-CM | POA: Diagnosis not present

## 2022-10-16 DIAGNOSIS — I6503 Occlusion and stenosis of bilateral vertebral arteries: Secondary | ICD-10-CM

## 2022-10-16 DIAGNOSIS — K1370 Unspecified lesions of oral mucosa: Secondary | ICD-10-CM

## 2022-10-16 DIAGNOSIS — R42 Dizziness and giddiness: Secondary | ICD-10-CM

## 2022-10-16 LAB — POCT GLYCOSYLATED HEMOGLOBIN (HGB A1C)
HbA1c POC (<> result, manual entry): 6 % (ref 4.0–5.6)
HbA1c, POC (controlled diabetic range): 6 % (ref 0.0–7.0)
HbA1c, POC (prediabetic range): 6 % (ref 5.7–6.4)
Hemoglobin A1C: 6 % — AB (ref 4.0–5.6)

## 2022-10-16 MED ORDER — LIDOCAINE VISCOUS HCL 2 % MT SOLN: 5.0000 mL | Freq: Three times a day (TID) | OROMUCOSAL | 1 refills | Status: DC

## 2022-10-16 NOTE — Addendum Note (Signed)
Addended by: Deveron Furlong D on: 10/16/2022 02:57 PM   Modules accepted: Orders
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
## 2022-10-21 LAB — BASIC METABOLIC PANEL
BUN: 32 — AB (ref 4–21)
CO2: 20 (ref 13–22)
Chloride: 109 — AB (ref 99–108)
Creatinine: 1.9 — AB (ref 0.6–1.3)
Glucose: 152
Potassium: 4.2 mEq/L (ref 3.5–5.1)
Sodium: 143 (ref 137–147)

## 2022-10-21 LAB — CBC AND DIFFERENTIAL
HCT: 31 — AB (ref 41–53)
Hemoglobin: 10.5 — AB (ref 13.5–17.5)
Neutrophils Absolute: 1.6
Platelets: 131 10*3/uL — AB (ref 150–400)
WBC: 2.6

## 2022-10-21 LAB — IRON,TIBC AND FERRITIN PANEL
%SAT: 24
Ferritin: 116
Iron: 87
TIBC: 360
UIBC: 273

## 2022-10-21 LAB — COMPREHENSIVE METABOLIC PANEL
Albumin: 3.7 (ref 3.5–5.0)
Calcium: 9.4 (ref 8.7–10.7)

## 2022-10-21 LAB — VITAMIN D 25 HYDROXY (VIT D DEFICIENCY, FRACTURES): Vit D, 25-Hydroxy: 25.1

## 2022-10-21 LAB — CBC: RBC: 3.34 — AB (ref 3.87–5.11)

## 2022-10-23 ENCOUNTER — Other Ambulatory Visit: Payer: Self-pay | Admitting: Student

## 2022-10-23 DIAGNOSIS — I771 Stricture of artery: Secondary | ICD-10-CM

## 2022-10-23 MED ORDER — CLOPIDOGREL BISULFATE 75 MG PO TABS: 75.0000 mg | ORAL_TABLET | Freq: Every day | ORAL | 2 refills | Status: DC

## 2022-10-23 NOTE — Progress Notes (Signed)
Refill of Plavix '75mg'$  # 90 with 2 refills called into patient's preferred pharmacy, [facility] in [place].   Brynda Greathouse, MS RD PA-C

## 2022-11-12 ENCOUNTER — Encounter: Payer: Self-pay | Admitting: Family Medicine

## 2022-11-12 ENCOUNTER — Ambulatory Visit (INDEPENDENT_AMBULATORY_CARE_PROVIDER_SITE_OTHER): Payer: Medicare Other | Admitting: Family Medicine

## 2022-11-12 VITALS — BP 103/56 | HR 79 | Temp 97.3°F | Ht 73.0 in | Wt 204.0 lb

## 2022-11-12 DIAGNOSIS — I6503 Occlusion and stenosis of bilateral vertebral arteries: Secondary | ICD-10-CM

## 2022-11-12 DIAGNOSIS — D72819 Decreased white blood cell count, unspecified: Secondary | ICD-10-CM

## 2022-11-12 DIAGNOSIS — C069 Malignant neoplasm of mouth, unspecified: Secondary | ICD-10-CM | POA: Diagnosis not present
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
## 2022-11-15 ENCOUNTER — Inpatient Hospital Stay (HOSPITAL_BASED_OUTPATIENT_CLINIC_OR_DEPARTMENT_OTHER): Payer: Medicare Other | Admitting: Hematology & Oncology

## 2022-11-15 ENCOUNTER — Inpatient Hospital Stay: Payer: Medicare Other | Attending: Hematology & Oncology

## 2022-11-15 ENCOUNTER — Encounter: Payer: Self-pay | Admitting: Hematology & Oncology

## 2022-11-15 VITALS — BP 110/56 | HR 84 | Temp 97.6°F | Resp 24 | Ht 72.0 in | Wt 202.1 lb

## 2022-11-15 DIAGNOSIS — N184 Chronic kidney disease, stage 4 (severe): Secondary | ICD-10-CM | POA: Insufficient documentation

## 2022-11-15 DIAGNOSIS — M791 Myalgia, unspecified site: Secondary | ICD-10-CM | POA: Insufficient documentation

## 2022-11-15 DIAGNOSIS — Z8673 Personal history of transient ischemic attack (TIA), and cerebral infarction without residual deficits: Secondary | ICD-10-CM | POA: Diagnosis not present

## 2022-11-15 DIAGNOSIS — K5900 Constipation, unspecified: Secondary | ICD-10-CM

## 2022-11-15 DIAGNOSIS — E1122 Type 2 diabetes mellitus with diabetic chronic kidney disease: Secondary | ICD-10-CM

## 2022-11-15 DIAGNOSIS — E119 Type 2 diabetes mellitus without complications: Secondary | ICD-10-CM

## 2022-11-15 DIAGNOSIS — Z91041 Radiographic dye allergy status: Secondary | ICD-10-CM

## 2022-11-15 DIAGNOSIS — D61818 Other pancytopenia: Secondary | ICD-10-CM | POA: Insufficient documentation

## 2022-11-15 DIAGNOSIS — R531 Weakness: Secondary | ICD-10-CM

## 2022-11-15 DIAGNOSIS — R5383 Other fatigue: Secondary | ICD-10-CM | POA: Insufficient documentation

## 2022-11-15 DIAGNOSIS — Z8261 Family history of arthritis: Secondary | ICD-10-CM | POA: Diagnosis not present

## 2022-11-15 DIAGNOSIS — Z87442 Personal history of urinary calculi: Secondary | ICD-10-CM | POA: Diagnosis not present

## 2022-11-15 DIAGNOSIS — Z87891 Personal history of nicotine dependence: Secondary | ICD-10-CM | POA: Insufficient documentation

## 2022-11-15 DIAGNOSIS — Z823 Family history of stroke: Secondary | ICD-10-CM | POA: Insufficient documentation

## 2022-11-15 DIAGNOSIS — R002 Palpitations: Secondary | ICD-10-CM | POA: Insufficient documentation

## 2022-11-15 DIAGNOSIS — Z79899 Other long term (current) drug therapy: Secondary | ICD-10-CM | POA: Diagnosis not present

## 2022-11-15 DIAGNOSIS — R519 Headache, unspecified: Secondary | ICD-10-CM

## 2022-11-15 DIAGNOSIS — H9190 Unspecified hearing loss, unspecified ear: Secondary | ICD-10-CM | POA: Diagnosis not present

## 2022-11-15 DIAGNOSIS — K1379 Other lesions of oral mucosa: Secondary | ICD-10-CM

## 2022-11-15 DIAGNOSIS — R0602 Shortness of breath: Secondary | ICD-10-CM

## 2022-11-15 DIAGNOSIS — Z8249 Family history of ischemic heart disease and other diseases of the circulatory system: Secondary | ICD-10-CM | POA: Insufficient documentation

## 2022-11-15 DIAGNOSIS — I129 Hypertensive chronic kidney disease with stage 1 through stage 4 chronic kidney disease, or unspecified chronic kidney disease: Secondary | ICD-10-CM | POA: Insufficient documentation

## 2022-11-15 DIAGNOSIS — Z833 Family history of diabetes mellitus: Secondary | ICD-10-CM | POA: Diagnosis not present

## 2022-11-15 DIAGNOSIS — Z880 Allergy status to penicillin: Secondary | ICD-10-CM

## 2022-11-15 DIAGNOSIS — R42 Dizziness and giddiness: Secondary | ICD-10-CM | POA: Insufficient documentation

## 2022-11-15 DIAGNOSIS — D518 Other vitamin B12 deficiency anemias: Secondary | ICD-10-CM

## 2022-11-15 DIAGNOSIS — Z7902 Long term (current) use of antithrombotics/antiplatelets: Secondary | ICD-10-CM | POA: Insufficient documentation

## 2022-11-15 LAB — CBC WITH DIFFERENTIAL (CANCER CENTER ONLY)
Abs Immature Granulocytes: 0.01 10*3/uL (ref 0.00–0.07)
Basophils Absolute: 0 10*3/uL (ref 0.0–0.1)
Basophils Relative: 1 %
Eosinophils Absolute: 0.1 10*3/uL (ref 0.0–0.5)
Eosinophils Relative: 5 %
HCT: 33.1 % — ABNORMAL LOW (ref 39.0–52.0)
Hemoglobin: 10.4 g/dL — ABNORMAL LOW (ref 13.0–17.0)
Immature Granulocytes: 0 %
Lymphocytes Relative: 20 %
Lymphs Abs: 0.6 10*3/uL — ABNORMAL LOW (ref 0.7–4.0)
MCH: 31 pg (ref 26.0–34.0)
MCHC: 31.4 g/dL (ref 30.0–36.0)
MCV: 98.5 fL (ref 80.0–100.0)
Monocytes Absolute: 0.5 10*3/uL (ref 0.1–1.0)
Monocytes Relative: 17 %
Neutro Abs: 1.6 10*3/uL — ABNORMAL LOW (ref 1.7–7.7)
Neutrophils Relative %: 57 %
Platelet Count: 119 10*3/uL — ABNORMAL LOW (ref 150–400)
RBC: 3.36 MIL/uL — ABNORMAL LOW (ref 4.22–5.81)
RDW: 16.6 % — ABNORMAL HIGH (ref 11.5–15.5)
WBC Count: 2.8 10*3/uL — ABNORMAL LOW (ref 4.0–10.5)
nRBC: 0 % (ref 0.0–0.2)

## 2022-11-15 LAB — LACTATE DEHYDROGENASE: LDH: 121 U/L (ref 98–192)

## 2022-11-15 LAB — IRON AND IRON BINDING CAPACITY (CC-WL,HP ONLY)
Iron: 102 ug/dL (ref 45–182)
Saturation Ratios: 26 % (ref 17.9–39.5)
TIBC: 389 ug/dL (ref 250–450)
UIBC: 287 ug/dL (ref 117–376)

## 2022-11-15 LAB — CMP (CANCER CENTER ONLY)
ALT: 28 U/L (ref 0–44)
AST: 45 U/L — ABNORMAL HIGH (ref 15–41)
Albumin: 3.8 g/dL (ref 3.5–5.0)
Alkaline Phosphatase: 62 U/L (ref 38–126)
Anion gap: 8 (ref 5–15)
BUN: 38 mg/dL — ABNORMAL HIGH (ref 8–23)
CO2: 26 mmol/L (ref 22–32)
Calcium: 9.3 mg/dL (ref 8.9–10.3)
Chloride: 105 mmol/L (ref 98–111)
Creatinine: 2.2 mg/dL — ABNORMAL HIGH (ref 0.61–1.24)
GFR, Estimated: 29 mL/min — ABNORMAL LOW (ref >60)
Glucose, Bld: 241 mg/dL — ABNORMAL HIGH (ref 70–99)
Potassium: 4.4 mmol/L (ref 3.5–5.1)
Sodium: 139 mmol/L (ref 135–145)
Total Bilirubin: 0.9 mg/dL (ref 0.3–1.2)
Total Protein: 5.8 g/dL — ABNORMAL LOW (ref 6.5–8.1)

## 2022-11-15 LAB — RETICULOCYTES
Immature Retic Fract: 13.7 % (ref 2.3–15.9)
RBC.: 3.26 MIL/uL — ABNORMAL LOW (ref 4.22–5.81)
Retic Count, Absolute: 45 10*3/uL (ref 19.0–186.0)
Retic Ct Pct: 1.4 % (ref 0.4–3.1)

## 2022-11-15 LAB — FERRITIN: Ferritin: 77 ng/mL (ref 24–336)

## 2022-11-15 LAB — SAVE SMEAR(SSMR), FOR PROVIDER SLIDE REVIEW

## 2022-11-15 LAB — VITAMIN B12: Vitamin B-12: 509 pg/mL (ref 180–914)
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
## 2022-11-16 LAB — ERYTHROPOIETIN: Erythropoietin: 10.4 m[IU]/mL (ref 2.6–18.5)

## 2022-11-26 LAB — MYELODYSPLASTIC SYNDROME (MDS) PANEL
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
## 2022-12-04 ENCOUNTER — Ambulatory Visit (INDEPENDENT_AMBULATORY_CARE_PROVIDER_SITE_OTHER): Payer: Medicare Other | Admitting: Neurology

## 2022-12-04 ENCOUNTER — Encounter: Payer: Self-pay | Admitting: Neurology

## 2022-12-04 VITALS — BP 113/61 | HR 97 | Resp 18 | Ht 72.0 in | Wt 205.0 lb

## 2022-12-04 DIAGNOSIS — I6502 Occlusion and stenosis of left vertebral artery: Secondary | ICD-10-CM

## 2022-12-04 DIAGNOSIS — G9608 Other cranial cerebrospinal fluid leak: Secondary | ICD-10-CM | POA: Diagnosis not present

## 2022-12-24 ENCOUNTER — Telehealth (HOSPITAL_COMMUNITY): Payer: Self-pay

## 2022-12-24 NOTE — Telephone Encounter (Signed)
Called to schedule cta head/neck. No answer, no vm. AB

## 2022-12-30 LAB — HM DIABETES EYE EXAM

## 2023-01-03 HISTORY — PX: MOUTH SURGERY: SHX715

## 2023-01-16 ENCOUNTER — Telehealth: Payer: Self-pay

## 2023-01-16 ENCOUNTER — Encounter: Payer: Self-pay | Admitting: Family Medicine

## 2023-01-16 ENCOUNTER — Ambulatory Visit (INDEPENDENT_AMBULATORY_CARE_PROVIDER_SITE_OTHER): Payer: Medicare Other | Admitting: Family Medicine

## 2023-01-16 VITALS — BP 131/61 | HR 97 | Temp 97.5°F | Ht 72.0 in | Wt 200.6 lb

## 2023-01-16 DIAGNOSIS — D61818 Other pancytopenia: Secondary | ICD-10-CM | POA: Diagnosis not present

## 2023-01-16 DIAGNOSIS — E1121 Type 2 diabetes mellitus with diabetic nephropathy: Secondary | ICD-10-CM

## 2023-01-16 DIAGNOSIS — L89312 Pressure ulcer of right buttock, stage 2: Secondary | ICD-10-CM | POA: Diagnosis not present

## 2023-01-16 DIAGNOSIS — S31819A Unspecified open wound of right buttock, initial encounter: Secondary | ICD-10-CM

## 2023-01-16 DIAGNOSIS — I6502 Occlusion and stenosis of left vertebral artery: Secondary | ICD-10-CM | POA: Diagnosis not present

## 2023-01-16 LAB — POCT GLYCOSYLATED HEMOGLOBIN (HGB A1C)
HbA1c POC (<> result, manual entry): 5.8 % (ref 4.0–5.6)
HbA1c, POC (controlled diabetic range): 5.8 % (ref 0.0–7.0)
HbA1c, POC (prediabetic range): 5.8 % (ref 5.7–6.4)
Hemoglobin A1C: 5.8 % — AB (ref 4.0–5.6)
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
## 2023-01-16 NOTE — Telephone Encounter (Signed)
[  2:56 PM] Ricardo [facility]s order [facility] nursing for Dave Wright for wound care mgmt, buttock ulcer stage 2.-thx

## 2023-01-16 NOTE — Telephone Encounter (Signed)
Spoke with pt to schedule AWV in office. Patient declined to schedule wellness visit at this time.   

## 2023-01-17 ENCOUNTER — Ambulatory Visit: Payer: Medicare Other | Admitting: Hematology & Oncology

## 2023-01-17 ENCOUNTER — Inpatient Hospital Stay: Payer: Medicare Other

## 2023-01-17 LAB — CBC WITH DIFFERENTIAL/PLATELET
Basophils Absolute: 0 10*3/uL (ref 0.0–0.1)
Basophils Relative: 0.6 % (ref 0.0–3.0)
Eosinophils Absolute: 0.1 10*3/uL (ref 0.0–0.7)
Eosinophils Relative: 2 % (ref 0.0–5.0)
HCT: 31.1 % — ABNORMAL LOW (ref 39.0–52.0)
Hemoglobin: 10.4 g/dL — ABNORMAL LOW (ref 13.0–17.0)
Lymphocytes Relative: 15.7 % (ref 12.0–46.0)
Lymphs Abs: 0.6 10*3/uL — ABNORMAL LOW (ref 0.7–4.0)
MCHC: 33.3 g/dL (ref 30.0–36.0)
MCV: 96.5 fl (ref 78.0–100.0)
Monocytes Absolute: 0.5 10*3/uL (ref 0.1–1.0)
Monocytes Relative: 13.1 % — ABNORMAL HIGH (ref 3.0–12.0)
Neutro Abs: 2.7 10*3/uL (ref 1.4–7.7)
Neutrophils Relative %: 68.6 % (ref 43.0–77.0)
Platelets: 158 10*3/uL (ref 150.0–400.0)
RBC: 3.22 Mil/uL — ABNORMAL LOW (ref 4.22–5.81)
RDW: 18.5 % — ABNORMAL HIGH (ref 11.5–15.5)
WBC: 4 10*3/uL (ref 4.0–10.5)

## 2023-01-17 LAB — COMPREHENSIVE METABOLIC PANEL
ALT: 21 U/L (ref 0–53)
AST: 39 U/L — ABNORMAL HIGH (ref 0–37)
Albumin: 3.2 g/dL — ABNORMAL LOW (ref 3.5–5.2)
Alkaline Phosphatase: 75 U/L (ref 39–117)
BUN: 36 mg/dL — ABNORMAL HIGH (ref 6–23)
CO2: 27 mEq/L (ref 19–32)
Calcium: 9.2 mg/dL (ref 8.4–10.5)
Chloride: 106 mEq/L (ref 96–112)
Creatinine, Ser: 1.78 mg/dL — ABNORMAL HIGH (ref 0.40–1.50)
GFR: 34.84 mL/min — ABNORMAL LOW (ref >60.00)
Glucose, Bld: 179 mg/dL — ABNORMAL HIGH (ref 70–99)
Potassium: 4.3 mEq/L (ref 3.5–5.1)
Sodium: 140 mEq/L (ref 135–145)
Total Bilirubin: 0.6 mg/dL (ref 0.2–1.2)
Total Protein: 5.5 g/dL — ABNORMAL LOW (ref 6.0–8.3)

## 2023-01-20 ENCOUNTER — Telehealth: Payer: Self-pay | Admitting: Family Medicine

## 2023-01-20 DIAGNOSIS — L89312 Pressure ulcer of right buttock, stage 2: Secondary | ICD-10-CM

## 2023-01-20 DIAGNOSIS — S31819A Unspecified open wound of right buttock, initial encounter: Secondary | ICD-10-CM

## 2023-01-20 NOTE — Telephone Encounter (Signed)
OK, pls re-enter wound clinic referral.  OK for [facility] as per [facility] nurse rec's.

## 2023-01-20 NOTE — Telephone Encounter (Signed)
Ria Comment with [facility] is needing verbal orders for skilled nursing frequency and sacral form dressing and lastly a referral to a wound center from Mr. [place]. She is requesting a call back at [phone]

## 2023-01-20 NOTE — Telephone Encounter (Signed)
Okay for orders? Wound care referral in chart. Will inform Ria Comment

## 2023-01-20 NOTE — Telephone Encounter (Signed)
Ria Comment returned call and recommended visits for 1 week 1, 2 week 6 and 2 prn for wound cared. She also wanted to do sacral wound dressings 1-2 times weekly. The wound care referral was cancelled so new order will have to be placed. There was some confusion about [facility] for wound care and separate referral for wound care. Pt would benefit from both.   Please advise

## 2023-01-21 ENCOUNTER — Telehealth: Payer: Self-pay

## 2023-01-21 NOTE — Telephone Encounter (Signed)
Referral was sent to  [place] at [facility] Address for Driving Directions: [id number], [place] [address]

## 2023-01-21 NOTE — Telephone Encounter (Signed)
Ria Comment with [facility] called back to advise change to frequency of visits.  2 week 4 1 week 3 2 prn  They would also like to order maxorb extra ag + alginate and cover with sacral butt foam to right buttock twice weekly.   Pls confirm if ok for change and addition to orders.

## 2023-01-21 NOTE — Telephone Encounter (Signed)
Ria Comment advised of all updates pertaining to referral and verbal orders.

## 2023-01-21 NOTE — Telephone Encounter (Signed)
See other encounter from 01/20/23.

## 2023-01-21 NOTE — Telephone Encounter (Signed)
Yes this is all okay

## 2023-01-21 NOTE — Telephone Encounter (Signed)
Referral re-ordered. Please see message below

## 2023-01-29 DIAGNOSIS — L89322 Pressure ulcer of left buttock, stage 2: Secondary | ICD-10-CM | POA: Diagnosis not present

## 2023-01-29 DIAGNOSIS — R519 Headache, unspecified: Secondary | ICD-10-CM

## 2023-01-29 DIAGNOSIS — Z794 Long term (current) use of insulin: Secondary | ICD-10-CM

## 2023-01-29 DIAGNOSIS — M109 Gout, unspecified: Secondary | ICD-10-CM

## 2023-01-29 DIAGNOSIS — Z483 Aftercare following surgery for neoplasm: Secondary | ICD-10-CM | POA: Diagnosis not present

## 2023-01-29 DIAGNOSIS — M47816 Spondylosis without myelopathy or radiculopathy, lumbar region: Secondary | ICD-10-CM

## 2023-01-29 DIAGNOSIS — L89156 Pressure-induced deep tissue damage of sacral region: Secondary | ICD-10-CM

## 2023-01-29 DIAGNOSIS — Z7982 Long term (current) use of aspirin: Secondary | ICD-10-CM

## 2023-01-29 DIAGNOSIS — N400 Enlarged prostate without lower urinary tract symptoms: Secondary | ICD-10-CM

## 2023-01-29 DIAGNOSIS — E1122 Type 2 diabetes mellitus with diabetic chronic kidney disease: Secondary | ICD-10-CM

## 2023-01-29 DIAGNOSIS — Z48817 Encounter for surgical aftercare following surgery on the skin and subcutaneous tissue: Secondary | ICD-10-CM | POA: Diagnosis not present

## 2023-01-29 DIAGNOSIS — F0781 Postconcussional syndrome: Secondary | ICD-10-CM

## 2023-01-29 DIAGNOSIS — I129 Hypertensive chronic kidney disease with stage 1 through stage 4 chronic kidney disease, or unspecified chronic kidney disease: Secondary | ICD-10-CM

## 2023-01-29 DIAGNOSIS — N183 Chronic kidney disease, stage 3 unspecified: Secondary | ICD-10-CM

## 2023-01-29 DIAGNOSIS — D61818 Other pancytopenia: Secondary | ICD-10-CM

## 2023-01-29 DIAGNOSIS — M19012 Primary osteoarthritis, left shoulder: Secondary | ICD-10-CM

## 2023-01-29 DIAGNOSIS — I951 Orthostatic hypotension: Secondary | ICD-10-CM

## 2023-01-29 DIAGNOSIS — L89312 Pressure ulcer of right buttock, stage 2: Secondary | ICD-10-CM | POA: Diagnosis not present

## 2023-01-29 DIAGNOSIS — Z8673 Personal history of transient ischemic attack (TIA), and cerebral infarction without residual deficits: Secondary | ICD-10-CM

## 2023-01-29 DIAGNOSIS — Z955 Presence of coronary angioplasty implant and graft: Secondary | ICD-10-CM

## 2023-01-29 DIAGNOSIS — Z96611 Presence of right artificial shoulder joint: Secondary | ICD-10-CM

## 2023-01-29 DIAGNOSIS — H9190 Unspecified hearing loss, unspecified ear: Secondary | ICD-10-CM

## 2023-01-29 DIAGNOSIS — E782 Mixed hyperlipidemia: Secondary | ICD-10-CM

## 2023-01-30 ENCOUNTER — Ambulatory Visit (INDEPENDENT_AMBULATORY_CARE_PROVIDER_SITE_OTHER): Payer: Medicare Other | Admitting: Family Medicine

## 2023-01-30 ENCOUNTER — Encounter: Payer: Self-pay | Admitting: Family Medicine

## 2023-01-30 VITALS — BP 165/65 | HR 71 | Temp 97.8°F | Ht 72.0 in | Wt 204.6 lb

## 2023-01-30 DIAGNOSIS — N1832 Chronic kidney disease, stage 3b: Secondary | ICD-10-CM

## 2023-01-30 DIAGNOSIS — M79662 Pain in left lower leg: Secondary | ICD-10-CM

## 2023-01-30 DIAGNOSIS — R6889 Other general symptoms and signs: Secondary | ICD-10-CM

## 2023-01-30 DIAGNOSIS — M1A00X0 Idiopathic chronic gout, unspecified site, without tophus (tophi): Secondary | ICD-10-CM

## 2023-01-30 DIAGNOSIS — E441 Mild protein-calorie malnutrition: Secondary | ICD-10-CM

## 2023-01-30 DIAGNOSIS — L98429 Non-pressure chronic ulcer of back with unspecified severity: Secondary | ICD-10-CM

## 2023-01-30 DIAGNOSIS — M7989 Other specified soft tissue disorders: Secondary | ICD-10-CM

## 2023-01-30 LAB — COMPREHENSIVE METABOLIC PANEL
ALT: 25 U/L (ref 0–53)
AST: 44 U/L — ABNORMAL HIGH (ref 0–37)
Albumin: 3.3 g/dL — ABNORMAL LOW (ref 3.5–5.2)
Alkaline Phosphatase: 65 U/L (ref 39–117)
BUN: 37 mg/dL — ABNORMAL HIGH (ref 6–23)
CO2: 26 mEq/L (ref 19–32)
Calcium: 9.2 mg/dL (ref 8.4–10.5)
Chloride: 108 mEq/L (ref 96–112)
Creatinine, Ser: 1.9 mg/dL — ABNORMAL HIGH (ref 0.40–1.50)
GFR: 32.21 mL/min — ABNORMAL LOW (ref >60.00)
Glucose, Bld: 144 mg/dL — ABNORMAL HIGH (ref 70–99)
Potassium: 4.2 mEq/L (ref 3.5–5.1)
Sodium: 142 mEq/L (ref 135–145)
Total Bilirubin: 0.7 mg/dL (ref 0.2–1.2)
Total Protein: 5.6 g/dL — ABNORMAL LOW (ref 6.0–8.3)

## 2023-01-30 LAB — PROTIME-INR
INR: 1.1 ratio — ABNORMAL HIGH (ref 0.8–1.0)
Prothrombin Time: 12.4 s (ref 9.6–13.1)

## 2023-01-30 MED ORDER — GLYCOPYRROLATE 1 MG PO TABS: ORAL_TABLET | ORAL | 1 refills | Status: DC

## 2023-01-30 MED ORDER — ALLOPURINOL 100 MG PO TABS: 100.0000 mg | ORAL_TABLET | Freq: Every day | ORAL | 3 refills | Status: DC
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
## 2023-01-31 ENCOUNTER — Telehealth: Payer: Self-pay

## 2023-01-31 DIAGNOSIS — M7989 Other specified soft tissue disorders: Secondary | ICD-10-CM

## 2023-01-31 DIAGNOSIS — R7989 Other specified abnormal findings of blood chemistry: Secondary | ICD-10-CM

## 2023-01-31 LAB — D-DIMER, QUANTITATIVE: D-Dimer, Quant: 0.57 mcg/mL FEU — ABNORMAL HIGH (ref <0.50)

## 2023-01-31 NOTE — Telephone Encounter (Signed)
-----   Message from Tammi Sou, MD sent at 01/31/2023  8:07 AM EDT ----- D-dimer is borderline elevated.  It is very possible that this minimal level of elevation is due to aging as well as his kidney dysfunction.  However, I think it is best to go ahead and get an ultrasound of the left leg to rule out a blood clot. Otherwise, all labs stable.  (Britt: pls order US venous unilateral left, dx left leg pain and swelling and elevated D dimer, med ctr [facility].  Thx).

## 2023-01-31 NOTE — Telephone Encounter (Signed)
Imaging order placed.

## 2023-02-02 ENCOUNTER — Ambulatory Visit (HOSPITAL_BASED_OUTPATIENT_CLINIC_OR_DEPARTMENT_OTHER)
Admission: RE | Admit: 2023-02-02 | Discharge: 2023-02-02 | Disposition: A | Discharge status: Home / Self Care | Payer: Medicare Other | Source: Ambulatory Visit | Attending: Family Medicine | Admitting: Family Medicine

## 2023-02-02 DIAGNOSIS — R7989 Other specified abnormal findings of blood chemistry: Secondary | ICD-10-CM

## 2023-02-02 DIAGNOSIS — M7989 Other specified soft tissue disorders: Secondary | ICD-10-CM | POA: Insufficient documentation

## 2023-02-02 DIAGNOSIS — M79662 Pain in left lower leg: Secondary | ICD-10-CM | POA: Diagnosis present

## 2023-02-04 ENCOUNTER — Other Ambulatory Visit (HOSPITAL_COMMUNITY): Payer: Self-pay

## 2023-02-04 NOTE — Telephone Encounter (Signed)
Pharmacy Patient Advocate Encounter   Received notification that prior authorization for Glycopyrrolate 1mg  is required/requested.  Per Test Claim: Product/service not covered - plan/benefit exclusion. Non-formulary drug. Pantoprazole, lansoprazole, omeprazole, rabeprazole, esomeprazole prefered   PA submitted on  02/04/23 to (ins) [employer] via CoverMyMeds Key  # [id number] Status is pending

## 2023-02-04 NOTE — Telephone Encounter (Signed)
No, these alternative meds are not appropriate.  Glycopyrrolate is the only medication that I can prescribe for his condition.

## 2023-02-04 NOTE — Telephone Encounter (Signed)
Please advise if alt med below appropriate.

## 2023-02-05 NOTE — Telephone Encounter (Signed)
LM for pt regarding medication ?

## 2023-02-05 NOTE — Telephone Encounter (Signed)
Noted  

## 2023-02-05 NOTE — Telephone Encounter (Signed)
Spoke with patients wife and she is aware that the medication that was prescribed is the only one that is appropriate for his condition per Dr. Anitra Lauth.

## 2023-02-11 ENCOUNTER — Telehealth: Payer: Self-pay | Admitting: Family Medicine

## 2023-02-11 NOTE — Telephone Encounter (Signed)
[name] with [facility] health called to inform us they will decrease nursing frequency due to Dave Wright no longer having any open areas. The new frequency will be once a week for four weeks.

## 2023-02-11 NOTE — Telephone Encounter (Signed)
FYI  Please see below

## 2023-02-13 NOTE — Telephone Encounter (Signed)
Caller/Agency: Jinny Blossom with [facility] Number: [phone]  Requesting OT/PT/Skilled Nursing/Social Work/Speech Therapy:  reducing skilled nursing frequency to 1x weekly for 4 weeks  Nurse had called on 3/26, she was checking status. Please call on Monday, April 1st.

## 2023-02-13 NOTE — Telephone Encounter (Signed)
Caller/Agency: Jinny Blossom with [facility] Number: [phone]  Requesting OT/PT/Skilled Nursing/Social Work/Speech Therapy:  reducing skilled nursing frequency to 1x weekly for 4 weeks   Nurse had called on 3/26, she was checking status.     Is this ok?

## 2023-02-17 NOTE — Telephone Encounter (Signed)
Yes, okay.

## 2023-02-17 NOTE — Telephone Encounter (Signed)
Left nurse a voicemail stating this is ok.

## 2023-02-22 ENCOUNTER — Other Ambulatory Visit: Payer: Self-pay | Admitting: Family Medicine

## 2023-02-27 ENCOUNTER — Ambulatory Visit (INDEPENDENT_AMBULATORY_CARE_PROVIDER_SITE_OTHER): Payer: Medicare Other | Admitting: Family Medicine

## 2023-02-27 ENCOUNTER — Encounter: Payer: Self-pay | Admitting: Family Medicine

## 2023-02-27 VITALS — BP 151/76 | HR 72 | Wt 207.0 lb

## 2023-02-27 DIAGNOSIS — E041 Nontoxic single thyroid nodule: Secondary | ICD-10-CM

## 2023-02-27 DIAGNOSIS — R600 Localized edema: Secondary | ICD-10-CM

## 2023-02-27 DIAGNOSIS — N1832 Chronic kidney disease, stage 3b: Secondary | ICD-10-CM

## 2023-02-27 DIAGNOSIS — K7460 Unspecified cirrhosis of liver: Secondary | ICD-10-CM | POA: Diagnosis not present
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
## 2023-03-11 ENCOUNTER — Inpatient Hospital Stay (HOSPITAL_BASED_OUTPATIENT_CLINIC_OR_DEPARTMENT_OTHER): Payer: Medicare Other | Admitting: Medical Oncology

## 2023-03-11 ENCOUNTER — Encounter: Payer: Self-pay | Admitting: Medical Oncology

## 2023-03-11 ENCOUNTER — Other Ambulatory Visit: Payer: Self-pay

## 2023-03-11 ENCOUNTER — Inpatient Hospital Stay: Payer: Medicare Other | Attending: Hematology & Oncology

## 2023-03-11 VITALS — BP 117/40 | HR 66 | Temp 97.7°F | Resp 19 | Ht 72.0 in | Wt 207.1 lb

## 2023-03-11 DIAGNOSIS — Z833 Family history of diabetes mellitus: Secondary | ICD-10-CM | POA: Insufficient documentation

## 2023-03-11 DIAGNOSIS — D518 Other vitamin B12 deficiency anemias: Secondary | ICD-10-CM

## 2023-03-11 DIAGNOSIS — D696 Thrombocytopenia, unspecified: Secondary | ICD-10-CM

## 2023-03-11 DIAGNOSIS — Z8673 Personal history of transient ischemic attack (TIA), and cerebral infarction without residual deficits: Secondary | ICD-10-CM | POA: Insufficient documentation

## 2023-03-11 DIAGNOSIS — D61818 Other pancytopenia: Secondary | ICD-10-CM | POA: Insufficient documentation

## 2023-03-11 DIAGNOSIS — M109 Gout, unspecified: Secondary | ICD-10-CM | POA: Diagnosis not present

## 2023-03-11 DIAGNOSIS — Z79899 Other long term (current) drug therapy: Secondary | ICD-10-CM | POA: Diagnosis not present

## 2023-03-11 DIAGNOSIS — Z7902 Long term (current) use of antithrombotics/antiplatelets: Secondary | ICD-10-CM | POA: Diagnosis not present

## 2023-03-11 DIAGNOSIS — D72819 Decreased white blood cell count, unspecified: Secondary | ICD-10-CM

## 2023-03-11 DIAGNOSIS — K1379 Other lesions of oral mucosa: Secondary | ICD-10-CM | POA: Insufficient documentation

## 2023-03-11 DIAGNOSIS — Z8261 Family history of arthritis: Secondary | ICD-10-CM | POA: Diagnosis not present

## 2023-03-11 DIAGNOSIS — D631 Anemia in chronic kidney disease: Secondary | ICD-10-CM | POA: Diagnosis not present

## 2023-03-11 DIAGNOSIS — N184 Chronic kidney disease, stage 4 (severe): Secondary | ICD-10-CM

## 2023-03-11 DIAGNOSIS — Z8249 Family history of ischemic heart disease and other diseases of the circulatory system: Secondary | ICD-10-CM | POA: Insufficient documentation

## 2023-03-11 DIAGNOSIS — Z823 Family history of stroke: Secondary | ICD-10-CM | POA: Diagnosis not present

## 2023-03-11 DIAGNOSIS — Z87891 Personal history of nicotine dependence: Secondary | ICD-10-CM | POA: Diagnosis not present

## 2023-03-11 DIAGNOSIS — Z87442 Personal history of urinary calculi: Secondary | ICD-10-CM | POA: Diagnosis not present

## 2023-03-11 DIAGNOSIS — E1122 Type 2 diabetes mellitus with diabetic chronic kidney disease: Secondary | ICD-10-CM | POA: Insufficient documentation

## 2023-03-11 DIAGNOSIS — Z880 Allergy status to penicillin: Secondary | ICD-10-CM | POA: Insufficient documentation

## 2023-03-11 LAB — CMP (CANCER CENTER ONLY)
ALT: 31 U/L (ref 0–44)
AST: 55 U/L — ABNORMAL HIGH (ref 15–41)
Albumin: 3.5 g/dL (ref 3.5–5.0)
Alkaline Phosphatase: 57 U/L (ref 38–126)
Anion gap: 7 (ref 5–15)
BUN: 33 mg/dL — ABNORMAL HIGH (ref 8–23)
CO2: 26 mmol/L (ref 22–32)
Calcium: 9 mg/dL (ref 8.9–10.3)
Chloride: 107 mmol/L (ref 98–111)
Creatinine: 1.92 mg/dL — ABNORMAL HIGH (ref 0.61–1.24)
GFR, Estimated: 34 mL/min — ABNORMAL LOW (ref >60)
Glucose, Bld: 155 mg/dL — ABNORMAL HIGH (ref 70–99)
Potassium: 4.9 mmol/L (ref 3.5–5.1)
Sodium: 140 mmol/L (ref 135–145)
Total Bilirubin: 0.8 mg/dL (ref 0.3–1.2)
Total Protein: 5.7 g/dL — ABNORMAL LOW (ref 6.5–8.1)

## 2023-03-11 LAB — CBC WITH DIFFERENTIAL (CANCER CENTER ONLY)
Abs Immature Granulocytes: 0.02 10*3/uL (ref 0.00–0.07)
Basophils Absolute: 0 10*3/uL (ref 0.0–0.1)
Basophils Relative: 1 %
Eosinophils Absolute: 0.1 10*3/uL (ref 0.0–0.5)
Eosinophils Relative: 4 %
HCT: 31.6 % — ABNORMAL LOW (ref 39.0–52.0)
Hemoglobin: 10.2 g/dL — ABNORMAL LOW (ref 13.0–17.0)
Immature Granulocytes: 1 %
Lymphocytes Relative: 22 %
Lymphs Abs: 0.6 10*3/uL — ABNORMAL LOW (ref 0.7–4.0)
MCH: 31.8 pg (ref 26.0–34.0)
MCHC: 32.3 g/dL (ref 30.0–36.0)
MCV: 98.4 fL (ref 80.0–100.0)
Monocytes Absolute: 0.6 10*3/uL (ref 0.1–1.0)
Monocytes Relative: 21 %
Neutro Abs: 1.3 10*3/uL — ABNORMAL LOW (ref 1.7–7.7)
Neutrophils Relative %: 51 %
Platelet Count: 114 10*3/uL — ABNORMAL LOW (ref 150–400)
RBC: 3.21 MIL/uL — ABNORMAL LOW (ref 4.22–5.81)
RDW: 16 % — ABNORMAL HIGH (ref 11.5–15.5)
WBC Count: 2.6 10*3/uL — ABNORMAL LOW (ref 4.0–10.5)
nRBC: 0 % (ref 0.0–0.2)

## 2023-03-11 LAB — RETICULOCYTES
Immature Retic Fract: 10.4 % (ref 2.3–15.9)
RBC.: 3.24 MIL/uL — ABNORMAL LOW (ref 4.22–5.81)
Retic Count, Absolute: 53.1 10*3/uL (ref 19.0–186.0)
Retic Ct Pct: 1.6 % (ref 0.4–3.1)

## 2023-03-11 LAB — SAVE SMEAR(SSMR), FOR PROVIDER SLIDE REVIEW
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
## 2023-03-25 ENCOUNTER — Other Ambulatory Visit: Payer: Self-pay

## 2023-03-25 DIAGNOSIS — D518 Other vitamin B12 deficiency anemias: Secondary | ICD-10-CM

## 2023-03-26 ENCOUNTER — Inpatient Hospital Stay: Payer: Medicare Other | Attending: Hematology & Oncology

## 2023-03-26 ENCOUNTER — Inpatient Hospital Stay: Payer: Medicare Other

## 2023-03-26 VITALS — BP 152/53 | HR 71 | Temp 97.6°F | Resp 18

## 2023-03-26 DIAGNOSIS — D631 Anemia in chronic kidney disease: Secondary | ICD-10-CM

## 2023-03-26 DIAGNOSIS — N184 Chronic kidney disease, stage 4 (severe): Secondary | ICD-10-CM | POA: Insufficient documentation

## 2023-03-26 DIAGNOSIS — Z91041 Radiographic dye allergy status: Secondary | ICD-10-CM | POA: Insufficient documentation

## 2023-03-26 DIAGNOSIS — Z79899 Other long term (current) drug therapy: Secondary | ICD-10-CM | POA: Diagnosis not present

## 2023-03-26 DIAGNOSIS — D61818 Other pancytopenia: Secondary | ICD-10-CM | POA: Diagnosis present

## 2023-03-26 DIAGNOSIS — Z880 Allergy status to penicillin: Secondary | ICD-10-CM | POA: Diagnosis not present

## 2023-03-26 DIAGNOSIS — E538 Deficiency of other specified B group vitamins: Secondary | ICD-10-CM | POA: Insufficient documentation

## 2023-03-26 DIAGNOSIS — D518 Other vitamin B12 deficiency anemias: Secondary | ICD-10-CM

## 2023-03-26 DIAGNOSIS — R0602 Shortness of breath: Secondary | ICD-10-CM | POA: Diagnosis not present

## 2023-03-26 DIAGNOSIS — M7989 Other specified soft tissue disorders: Secondary | ICD-10-CM | POA: Diagnosis not present

## 2023-03-26 LAB — CBC WITH DIFFERENTIAL (CANCER CENTER ONLY)
Abs Immature Granulocytes: 0.01 10*3/uL (ref 0.00–0.07)
Basophils Absolute: 0 10*3/uL (ref 0.0–0.1)
Basophils Relative: 1 %
Eosinophils Absolute: 0.1 10*3/uL (ref 0.0–0.5)
Eosinophils Relative: 3 %
HCT: 30.5 % — ABNORMAL LOW (ref 39.0–52.0)
Hemoglobin: 9.7 g/dL — ABNORMAL LOW (ref 13.0–17.0)
Immature Granulocytes: 1 %
Lymphocytes Relative: 26 %
Lymphs Abs: 0.5 10*3/uL — ABNORMAL LOW (ref 0.7–4.0)
MCH: 31.3 pg (ref 26.0–34.0)
MCHC: 31.8 g/dL (ref 30.0–36.0)
MCV: 98.4 fL (ref 80.0–100.0)
Monocytes Absolute: 0.5 10*3/uL (ref 0.1–1.0)
Monocytes Relative: 22 %
Neutro Abs: 1 10*3/uL — ABNORMAL LOW (ref 1.7–7.7)
Neutrophils Relative %: 47 %
Platelet Count: 99 10*3/uL — ABNORMAL LOW (ref 150–400)
RBC: 3.1 MIL/uL — ABNORMAL LOW (ref 4.22–5.81)
RDW: 16 % — ABNORMAL HIGH (ref 11.5–15.5)
WBC Count: 2.1 10*3/uL — ABNORMAL LOW (ref 4.0–10.5)
nRBC: 0 % (ref 0.0–0.2)

## 2023-03-26 LAB — CMP (CANCER CENTER ONLY)
ALT: 28 U/L (ref 0–44)
AST: 52 U/L — ABNORMAL HIGH (ref 15–41)
Albumin: 3.7 g/dL (ref 3.5–5.0)
Alkaline Phosphatase: 53 U/L (ref 38–126)
Anion gap: 7 (ref 5–15)
BUN: 40 mg/dL — ABNORMAL HIGH (ref 8–23)
CO2: 24 mmol/L (ref 22–32)
Calcium: 9.4 mg/dL (ref 8.9–10.3)
Chloride: 107 mmol/L (ref 98–111)
Creatinine: 1.95 mg/dL — ABNORMAL HIGH (ref 0.61–1.24)
GFR, Estimated: 34 mL/min — ABNORMAL LOW (ref >60)
Glucose, Bld: 203 mg/dL — ABNORMAL HIGH (ref 70–99)
Potassium: 4.8 mmol/L (ref 3.5–5.1)
Sodium: 138 mmol/L (ref 135–145)
Total Bilirubin: 0.8 mg/dL (ref 0.3–1.2)
Total Protein: 5.9 g/dL — ABNORMAL LOW (ref 6.5–8.1)

## 2023-03-26 MED ORDER — EPOETIN ALFA-EPBX 10000 UNIT/ML IJ SOLN
10000.0000 [IU] | Freq: Once | INTRAMUSCULAR | Status: AC
  Administered 2023-03-26: 10000 [IU] via SUBCUTANEOUS
  Filled 2023-03-26: qty 1

## 2023-03-26 NOTE — Patient Instructions (Signed)

## 2023-04-01 ENCOUNTER — Other Ambulatory Visit: Payer: Self-pay | Admitting: Family

## 2023-04-01 DIAGNOSIS — D509 Iron deficiency anemia, unspecified: Secondary | ICD-10-CM

## 2023-04-01 DIAGNOSIS — D518 Other vitamin B12 deficiency anemias: Secondary | ICD-10-CM

## 2023-04-01 DIAGNOSIS — N184 Chronic kidney disease, stage 4 (severe): Secondary | ICD-10-CM

## 2023-04-02 ENCOUNTER — Inpatient Hospital Stay: Payer: Medicare Other

## 2023-04-02 ENCOUNTER — Other Ambulatory Visit: Payer: Self-pay

## 2023-04-02 VITALS — BP 132/54 | HR 78 | Temp 97.6°F | Resp 16

## 2023-04-02 DIAGNOSIS — D631 Anemia in chronic kidney disease: Secondary | ICD-10-CM

## 2023-04-02 DIAGNOSIS — D61818 Other pancytopenia: Secondary | ICD-10-CM | POA: Diagnosis not present

## 2023-04-02 LAB — CMP (CANCER CENTER ONLY)
ALT: 27 U/L (ref 0–44)
AST: 52 U/L — ABNORMAL HIGH (ref 15–41)
Albumin: 3.8 g/dL (ref 3.5–5.0)
Alkaline Phosphatase: 57 U/L (ref 38–126)
Anion gap: 6 (ref 5–15)
BUN: 40 mg/dL — ABNORMAL HIGH (ref 8–23)
CO2: 26 mmol/L (ref 22–32)
Calcium: 9.8 mg/dL (ref 8.9–10.3)
Chloride: 111 mmol/L (ref 98–111)
Creatinine: 1.77 mg/dL — ABNORMAL HIGH (ref 0.61–1.24)
GFR, Estimated: 38 mL/min — ABNORMAL LOW (ref >60)
Glucose, Bld: 172 mg/dL — ABNORMAL HIGH (ref 70–99)
Potassium: 4.7 mmol/L (ref 3.5–5.1)
Sodium: 143 mmol/L (ref 135–145)
Total Bilirubin: 0.7 mg/dL (ref 0.3–1.2)
Total Protein: 6 g/dL — ABNORMAL LOW (ref 6.5–8.1)

## 2023-04-02 LAB — CBC WITH DIFFERENTIAL (CANCER CENTER ONLY)
Abs Immature Granulocytes: 0.01 10*3/uL (ref 0.00–0.07)
Basophils Absolute: 0 10*3/uL (ref 0.0–0.1)
Basophils Relative: 1 %
Eosinophils Absolute: 0.1 10*3/uL (ref 0.0–0.5)
Eosinophils Relative: 4 %
HCT: 32.7 % — ABNORMAL LOW (ref 39.0–52.0)
Hemoglobin: 10.3 g/dL — ABNORMAL LOW (ref 13.0–17.0)
Immature Granulocytes: 1 %
Lymphocytes Relative: 27 %
Lymphs Abs: 0.6 10*3/uL — ABNORMAL LOW (ref 0.7–4.0)
MCH: 31.7 pg (ref 26.0–34.0)
MCHC: 31.5 g/dL (ref 30.0–36.0)
MCV: 100.6 fL — ABNORMAL HIGH (ref 80.0–100.0)
Monocytes Absolute: 0.5 10*3/uL (ref 0.1–1.0)
Monocytes Relative: 22 %
Neutro Abs: 0.9 10*3/uL — ABNORMAL LOW (ref 1.7–7.7)
Neutrophils Relative %: 45 %
Platelet Count: 117 10*3/uL — ABNORMAL LOW (ref 150–400)
RBC: 3.25 MIL/uL — ABNORMAL LOW (ref 4.22–5.81)
RDW: 16.1 % — ABNORMAL HIGH (ref 11.5–15.5)
WBC Count: 2.1 10*3/uL — ABNORMAL LOW (ref 4.0–10.5)
nRBC: 0 % (ref 0.0–0.2)

## 2023-04-02 MED ORDER — EPOETIN ALFA-EPBX 10000 UNIT/ML IJ SOLN
10000.0000 [IU] | Freq: Once | INTRAMUSCULAR | Status: AC
  Administered 2023-04-02: 10000 [IU] via SUBCUTANEOUS
  Filled 2023-04-02: qty 1

## 2023-04-09 ENCOUNTER — Inpatient Hospital Stay: Payer: Medicare Other

## 2023-04-09 ENCOUNTER — Inpatient Hospital Stay (HOSPITAL_BASED_OUTPATIENT_CLINIC_OR_DEPARTMENT_OTHER): Payer: Medicare Other | Admitting: Family

## 2023-04-09 ENCOUNTER — Encounter: Payer: Self-pay | Admitting: Family

## 2023-04-09 VITALS — BP 175/63 | HR 70 | Temp 97.9°F | Resp 19 | Wt 217.1 lb

## 2023-04-09 DIAGNOSIS — N184 Chronic kidney disease, stage 4 (severe): Secondary | ICD-10-CM

## 2023-04-09 DIAGNOSIS — D518 Other vitamin B12 deficiency anemias: Secondary | ICD-10-CM

## 2023-04-09 DIAGNOSIS — D631 Anemia in chronic kidney disease: Secondary | ICD-10-CM

## 2023-04-09 DIAGNOSIS — D509 Iron deficiency anemia, unspecified: Secondary | ICD-10-CM

## 2023-04-09 DIAGNOSIS — D61818 Other pancytopenia: Secondary | ICD-10-CM | POA: Diagnosis not present

## 2023-04-09 LAB — CBC WITH DIFFERENTIAL (CANCER CENTER ONLY)
Abs Immature Granulocytes: 0.01 10*3/uL (ref 0.00–0.07)
Basophils Absolute: 0 10*3/uL (ref 0.0–0.1)
Basophils Relative: 1 %
Eosinophils Absolute: 0 10*3/uL (ref 0.0–0.5)
Eosinophils Relative: 3 %
HCT: 30.5 % — ABNORMAL LOW (ref 39.0–52.0)
Hemoglobin: 9.7 g/dL — ABNORMAL LOW (ref 13.0–17.0)
Immature Granulocytes: 1 %
Lymphocytes Relative: 23 %
Lymphs Abs: 0.4 10*3/uL — ABNORMAL LOW (ref 0.7–4.0)
MCH: 31.3 pg (ref 26.0–34.0)
MCHC: 31.8 g/dL (ref 30.0–36.0)
MCV: 98.4 fL (ref 80.0–100.0)
Monocytes Absolute: 0.3 10*3/uL (ref 0.1–1.0)
Monocytes Relative: 20 %
Neutro Abs: 0.9 10*3/uL — ABNORMAL LOW (ref 1.7–7.7)
Neutrophils Relative %: 52 %
Platelet Count: 105 10*3/uL — ABNORMAL LOW (ref 150–400)
RBC: 3.1 MIL/uL — ABNORMAL LOW (ref 4.22–5.81)
RDW: 16.1 % — ABNORMAL HIGH (ref 11.5–15.5)
WBC Count: 1.6 10*3/uL — ABNORMAL LOW (ref 4.0–10.5)
nRBC: 0 % (ref 0.0–0.2)

## 2023-04-09 LAB — RETICULOCYTES
Immature Retic Fract: 10.9 % (ref 2.3–15.9)
RBC.: 3.09 MIL/uL — ABNORMAL LOW (ref 4.22–5.81)
Retic Count, Absolute: 45.1 10*3/uL (ref 19.0–186.0)
Retic Ct Pct: 1.5 % (ref 0.4–3.1)

## 2023-04-09 LAB — CMP (CANCER CENTER ONLY)
ALT: 31 U/L (ref 0–44)
AST: 53 U/L — ABNORMAL HIGH (ref 15–41)
Albumin: 3.6 g/dL (ref 3.5–5.0)
Alkaline Phosphatase: 56 U/L (ref 38–126)
Anion gap: 8 (ref 5–15)
BUN: 37 mg/dL — ABNORMAL HIGH (ref 8–23)
CO2: 24 mmol/L (ref 22–32)
Calcium: 9.3 mg/dL (ref 8.9–10.3)
Chloride: 108 mmol/L (ref 98–111)
Creatinine: 2.11 mg/dL — ABNORMAL HIGH (ref 0.61–1.24)
GFR, Estimated: 30 mL/min — ABNORMAL LOW (ref >60)
Glucose, Bld: 169 mg/dL — ABNORMAL HIGH (ref 70–99)
Potassium: 4.8 mmol/L (ref 3.5–5.1)
Sodium: 140 mmol/L (ref 135–145)
Total Bilirubin: 0.7 mg/dL (ref 0.3–1.2)
Total Protein: 5.6 g/dL — ABNORMAL LOW (ref 6.5–8.1)

## 2023-04-09 LAB — LACTATE DEHYDROGENASE: LDH: 155 U/L (ref 98–192)

## 2023-04-09 LAB — FERRITIN: Ferritin: 39 ng/mL (ref 24–336)

## 2023-04-09 MED ORDER — EPOETIN ALFA-EPBX 10000 UNIT/ML IJ SOLN
10000.0000 [IU] | Freq: Once | INTRAMUSCULAR | Status: AC
  Administered 2023-04-09: 10000 [IU] via SUBCUTANEOUS
  Filled 2023-04-09: qty 1

## 2023-04-09 NOTE — Progress Notes (Signed)
Hematology and Oncology Follow Up Visit  KAINALU LADY [id number] 03-25-1941 82 y.o. 04/09/2023   Principle Diagnosis:  Pancytopenia   Current Therapy:   Retacrit injection for Hgb < 11   Interim History:  Mr. Mikita is here today with his wife for follow-up and ESA injection. Hgb is 9.7, WBC count 1.6, ANC 0.9 and platelets 105.  No blood loss noted. No abnormal bruising, no petechiae.  He has had no issue with infections. No fever, chills, n/v, cough, rash, dizziness, chest pain, palpitations, abdominal pain or changes in bowel or bladder habits.  He has some mild SOB with over exertion and takes a break to rest when needed.  He has chronic swelling in both lower extremities. They note that since his allopurinol was decreased due to poor kidney function, his swelling seems to be increased. They plan to discuss this with Dr. Milinda Cave. Pedal pulses are 1+. No redness or discharge.  No falls or syncope reported.  Appetite and hydration are good. Weight is 217 lbs.   ECOG Performance Status: 1 - Symptomatic but completely ambulatory  Medications:  Allergies as of 04/09/2023       Reactions   Ivp Dye [iodinated Contrast Media] Hives   Penicillins Hives   Ferrous Sulfate Diarrhea        Medication List        Accurate as of Apr 09, 2023  2:09 PM. If you have any questions, ask your nurse or doctor.          acetaminophen 325 MG tablet Commonly known as: TYLENOL Take 650 mg by mouth every 6 (six) hours as needed for moderate pain.   allopurinol 100 MG tablet Commonly known as: ZYLOPRIM Take 1 tablet (100 mg total) by mouth daily.   aspirin EC 81 MG tablet Take 81 mg by mouth daily. Swallow whole.   atorvastatin 10 MG tablet Commonly known as: LIPITOR TAKE 1 TABLET BY MOUTH DAILY   carboxymethylcellulose 0.5 % Soln Commonly known as: REFRESH PLUS Place 1 drop into both eyes 3 (three) times daily as needed (dry eyes).   cholecalciferol 25 MCG (1000 UNIT)  tablet Commonly known as: VITAMIN D3 Take 2 tablets by mouth daily.   clopidogrel 75 MG tablet Commonly known as: Plavix Take 1 tablet (75 mg total) by mouth daily.   fenofibrate 54 MG tablet TAKE 2 TABLETS BY MOUTH DAILY   finasteride 5 MG tablet Commonly known as: PROSCAR TAKE 1 TABLET BY MOUTH DAILY   Lantus 100 UNIT/ML injection Generic drug: insulin glargine 34 U SQ once a day What changed: additional instructions   magnesium oxide 400 MG tablet Commonly known as: MAG-OX Take 800 mg by mouth at bedtime.   tamsulosin 0.4 MG Caps capsule Commonly known as: FLOMAX TAKE 2 CAPSULES BY MOUTH DAILY        Allergies:  Allergies  Allergen Reactions   Ivp Dye [Iodinated Contrast Media] Hives   Penicillins Hives   Ferrous Sulfate Diarrhea    Past Medical History, Surgical history, Social history, and Family History were reviewed and updated.  Review of Systems: All other 10 point review of systems is negative.   Physical Exam:  weight is 217 lb 1.9 oz (98.5 kg). His oral temperature is 97.9 F (36.6 C). His blood pressure is 175/63 (abnormal) and his pulse is 70. His respiration is 19 and oxygen saturation is 100%.   Wt Readings from Last 3 Encounters:  04/09/23 217 lb 1.9 oz (98.5 kg)  03/11/23  207 lb 1.3 oz (93.9 kg)  02/27/23 207 lb (93.9 kg)    Ocular: Sclerae unicteric, pupils equal, round and reactive to light Ear-nose-throat: Oropharynx clear, dentition fair Lymphatic: No cervical or supraclavicular adenopathy Lungs no rales or rhonchi, good excursion bilaterally Heart regular rate and rhythm, no murmur appreciated Abd soft, nontender, positive bowel sounds MSK no focal spinal tenderness, no joint edema Neuro: non-focal, well-oriented, appropriate affect Breasts: Deferred   Lab Results  Component Value Date   WBC 1.6 (L) 04/09/2023   HGB 9.7 (L) 04/09/2023   HCT 30.5 (L) 04/09/2023   MCV 98.4 04/09/2023   PLT 105 (L) 04/09/2023   Lab Results   Component Value Date   FERRITIN 77 11/15/2022   IRON 102 11/15/2022   TIBC 389 11/15/2022   UIBC 287 11/15/2022   IRONPCTSAT 26 11/15/2022   Lab Results  Component Value Date   RETICCTPCT 1.5 04/09/2023   RBC 3.10 (L) 04/09/2023   RBC 3.09 (L) 04/09/2023   No results found for: "KPAFRELGTCHN", "LAMBDASER", "KAPLAMBRATIO" No results found for: "IGGSERUM", "IGA", "IGMSERUM" No results found for: "TOTALPROTELP", "ALBUMINELP", "A1GS", "A2GS", "BETS", "BETA2SER", "GAMS", "MSPIKE", "SPEI"   Chemistry      Component Value Date/Time   NA 140 04/09/2023 1326   NA 143 10/21/2022 0000   K 4.8 04/09/2023 1326   CL 108 04/09/2023 1326   CO2 24 04/09/2023 1326   BUN 37 (H) 04/09/2023 1326   BUN 32 (A) 10/21/2022 0000   CREATININE 2.11 (H) 04/09/2023 1326   CREATININE 2.03 (H) 08/16/2022 1415   GLU 152 10/21/2022 0000      Component Value Date/Time   CALCIUM 9.3 04/09/2023 1326   ALKPHOS 56 04/09/2023 1326   AST 53 (H) 04/09/2023 1326   ALT 31 04/09/2023 1326   BILITOT 0.7 04/09/2023 1326       Impression and Plan: Mr. 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  I spoke with Dr. Myna Hidalgo and went over lab work. We will hold off on bone marrow biopsy for now.  Since he is asymptomatic with the low ANC and WBC count we will just watch for now.  ESA give for Hgb 9.7.  Iron studies are pending. We will replace if needed.  Follow-up in 2 weeks.   Eileen Stanford, NP 5/22/20242:09 PM

## 2023-04-09 NOTE — Patient Instructions (Signed)

## 2023-04-10 LAB — IRON AND IRON BINDING CAPACITY (CC-WL,HP ONLY)
Iron: 51 ug/dL (ref 45–182)
Saturation Ratios: 12 % — ABNORMAL LOW (ref 17.9–39.5)
TIBC: 419 ug/dL (ref 250–450)
UIBC: 368 ug/dL (ref 117–376)

## 2023-04-15 ENCOUNTER — Other Ambulatory Visit: Payer: Self-pay | Admitting: Family

## 2023-04-21 ENCOUNTER — Inpatient Hospital Stay: Payer: Medicare Other | Attending: Hematology & Oncology

## 2023-04-21 VITALS — BP 174/58 | HR 65 | Temp 97.7°F | Resp 20

## 2023-04-21 DIAGNOSIS — R600 Localized edema: Secondary | ICD-10-CM | POA: Insufficient documentation

## 2023-04-21 DIAGNOSIS — Z7902 Long term (current) use of antithrombotics/antiplatelets: Secondary | ICD-10-CM | POA: Diagnosis not present

## 2023-04-21 DIAGNOSIS — Z91041 Radiographic dye allergy status: Secondary | ICD-10-CM | POA: Diagnosis not present

## 2023-04-21 DIAGNOSIS — R0602 Shortness of breath: Secondary | ICD-10-CM | POA: Insufficient documentation

## 2023-04-21 DIAGNOSIS — D61818 Other pancytopenia: Secondary | ICD-10-CM | POA: Diagnosis present

## 2023-04-21 DIAGNOSIS — R519 Headache, unspecified: Secondary | ICD-10-CM | POA: Diagnosis not present

## 2023-04-21 DIAGNOSIS — Z79899 Other long term (current) drug therapy: Secondary | ICD-10-CM | POA: Diagnosis not present

## 2023-04-21 DIAGNOSIS — D631 Anemia in chronic kidney disease: Secondary | ICD-10-CM

## 2023-04-21 MED ORDER — SODIUM CHLORIDE 0.9 % IV SOLN
1000.0000 mg | Freq: Once | INTRAVENOUS | Status: AC
  Administered 2023-04-21: 1000 mg via INTRAVENOUS
  Filled 2023-04-21: qty 10

## 2023-04-21 MED ORDER — SODIUM CHLORIDE 0.9 % IV SOLN: Freq: Once | INTRAVENOUS | Status: AC

## 2023-04-21 NOTE — Patient Instructions (Signed)

## 2023-04-23 ENCOUNTER — Ambulatory Visit: Payer: Medicare Other | Admitting: Family

## 2023-04-23 ENCOUNTER — Ambulatory Visit: Payer: Medicare Other

## 2023-04-23 ENCOUNTER — Inpatient Hospital Stay: Payer: Medicare Other

## 2023-04-28 ENCOUNTER — Inpatient Hospital Stay (HOSPITAL_BASED_OUTPATIENT_CLINIC_OR_DEPARTMENT_OTHER): Payer: Medicare Other | Admitting: Medical Oncology

## 2023-04-28 ENCOUNTER — Inpatient Hospital Stay: Payer: Medicare Other

## 2023-04-28 VITALS — BP 120/51 | HR 71 | Temp 97.6°F | Resp 16 | Ht 72.0 in | Wt 215.0 lb

## 2023-04-28 DIAGNOSIS — D509 Iron deficiency anemia, unspecified: Secondary | ICD-10-CM

## 2023-04-28 DIAGNOSIS — D631 Anemia in chronic kidney disease: Secondary | ICD-10-CM

## 2023-04-28 DIAGNOSIS — D696 Thrombocytopenia, unspecified: Secondary | ICD-10-CM | POA: Diagnosis not present

## 2023-04-28 DIAGNOSIS — N184 Chronic kidney disease, stage 4 (severe): Secondary | ICD-10-CM | POA: Diagnosis not present

## 2023-04-28 DIAGNOSIS — D61818 Other pancytopenia: Secondary | ICD-10-CM | POA: Diagnosis not present

## 2023-04-28 DIAGNOSIS — D72819 Decreased white blood cell count, unspecified: Secondary | ICD-10-CM

## 2023-04-28 LAB — CBC WITH DIFFERENTIAL (CANCER CENTER ONLY)
Abs Immature Granulocytes: 0.02 10*3/uL (ref 0.00–0.07)
Basophils Absolute: 0 10*3/uL (ref 0.0–0.1)
Basophils Relative: 1 %
Eosinophils Absolute: 0.1 10*3/uL (ref 0.0–0.5)
Eosinophils Relative: 3 %
HCT: 32.8 % — ABNORMAL LOW (ref 39.0–52.0)
Hemoglobin: 10.6 g/dL — ABNORMAL LOW (ref 13.0–17.0)
Immature Granulocytes: 1 %
Lymphocytes Relative: 22 %
Lymphs Abs: 0.4 10*3/uL — ABNORMAL LOW (ref 0.7–4.0)
MCH: 31.5 pg (ref 26.0–34.0)
MCHC: 32.3 g/dL (ref 30.0–36.0)
MCV: 97.3 fL (ref 80.0–100.0)
Monocytes Absolute: 0.4 10*3/uL (ref 0.1–1.0)
Monocytes Relative: 22 %
Neutro Abs: 1 10*3/uL — ABNORMAL LOW (ref 1.7–7.7)
Neutrophils Relative %: 51 %
Platelet Count: 102 10*3/uL — ABNORMAL LOW (ref 150–400)
RBC: 3.37 MIL/uL — ABNORMAL LOW (ref 4.22–5.81)
RDW: 16.5 % — ABNORMAL HIGH (ref 11.5–15.5)
WBC Count: 2 10*3/uL — ABNORMAL LOW (ref 4.0–10.5)
nRBC: 0 % (ref 0.0–0.2)

## 2023-04-28 LAB — IRON,TIBC AND FERRITIN PANEL
%SAT: 45
Ferritin: 623
Iron: 165
TIBC: 365
UIBC: 200

## 2023-04-28 LAB — CMP (CANCER CENTER ONLY)
ALT: 28 U/L (ref 0–44)
AST: 53 U/L — ABNORMAL HIGH (ref 15–41)
Albumin: 3.5 g/dL (ref 3.5–5.0)
Alkaline Phosphatase: 56 U/L (ref 38–126)
Anion gap: 6 (ref 5–15)
BUN: 42 mg/dL — ABNORMAL HIGH (ref 8–23)
CO2: 26 mmol/L (ref 22–32)
Calcium: 9.5 mg/dL (ref 8.9–10.3)
Chloride: 109 mmol/L (ref 98–111)
Creatinine: 2.02 mg/dL — ABNORMAL HIGH (ref 0.61–1.24)
GFR, Estimated: 32 mL/min — ABNORMAL LOW (ref >60)
Glucose, Bld: 134 mg/dL — ABNORMAL HIGH (ref 70–99)
Potassium: 4.5 mmol/L (ref 3.5–5.1)
Sodium: 141 mmol/L (ref 135–145)
Total Bilirubin: 0.7 mg/dL (ref 0.3–1.2)
Total Protein: 5.7 g/dL — ABNORMAL LOW (ref 6.5–8.1)

## 2023-04-28 LAB — LACTATE DEHYDROGENASE: LDH: 154 U/L (ref 98–192)

## 2023-04-28 LAB — BASIC METABOLIC PANEL
BUN: 39 — AB (ref 4–21)
Glucose: 128
Sodium: 141 (ref 137–147)

## 2023-04-28 LAB — RETICULOCYTES
Immature Retic Fract: 13.2 % (ref 2.3–15.9)
RBC.: 3.32 MIL/uL — ABNORMAL LOW (ref 4.22–5.81)
Retic Count, Absolute: 63.1 10*3/uL (ref 19.0–186.0)
Retic Ct Pct: 1.9 % (ref 0.4–3.1)

## 2023-04-28 LAB — COMPREHENSIVE METABOLIC PANEL: eGFR: 35

## 2023-04-28 MED ORDER — EPOETIN ALFA-EPBX 10000 UNIT/ML IJ SOLN
10000.0000 [IU] | Freq: Once | INTRAMUSCULAR | Status: AC
  Administered 2023-04-28: 10000 [IU] via SUBCUTANEOUS
  Filled 2023-04-28: qty 1

## 2023-04-28 NOTE — Patient Instructions (Signed)

## 2023-04-28 NOTE — Progress Notes (Signed)
Hematology and Oncology Follow Up Visit  Dave Wright [id number] 29-Apr-1941 82 y.o. 04/28/2023   Principle Diagnosis:  Pancytopenia   Current Therapy:   Retacrit injection for Hgb < 11   Interim History:  Dave Wright is here today with his wife for follow-up and ESA injection.   They report that overall he is doing fair. He has struggled with some lower extremity edema off and on for the past few months. He has had doppler work up by PCP without findings. They think it is due to him not propping his legs up enough. No new SOB, chest pain or calf pain. They are continuing to work on with Dave Wright on improving this.   He also awoke with a mild headache today. Described as 5/10 of posterior head. Not worsening. Thinks it was from sleeping in recliner. No known injury. No nausea, no visual changes, neuro changes.   No blood loss noted. No abnormal bruising, no petechiae.  He has had no issue with infections. No fever, chills, n/v, cough, rash, dizziness, chest pain, palpitations, abdominal pain or changes in bowel or bladder habits.  He has some mild SOB with over exertion and takes a break to rest when needed.   No falls or syncope reported.  Appetite and hydration are good.  Wt Readings from Last 3 Encounters:  04/28/23 215 lb (97.5 kg)  04/09/23 217 lb 1.9 oz (98.5 kg)  03/11/23 207 lb 1.3 oz (93.9 kg)     ECOG Performance Status: 1 - Symptomatic but completely ambulatory  Medications:  Allergies as of 04/28/2023       Reactions   Ivp Dye [iodinated Contrast Media] Hives   Penicillins Hives   Ferrous Sulfate Diarrhea        Medication List        Accurate as of April 28, 2023  1:13 PM. If you have any questions, ask your nurse or doctor.          acetaminophen 325 MG tablet Commonly known as: TYLENOL Take 650 mg by mouth every 6 (six) hours as needed for moderate pain.   allopurinol 100 MG tablet Commonly known as: ZYLOPRIM Take 1 tablet (100 mg  total) by mouth daily.   aspirin EC 81 MG tablet Take 81 mg by mouth daily. Swallow whole.   atorvastatin 10 MG tablet Commonly known as: LIPITOR TAKE 1 TABLET BY MOUTH DAILY   carboxymethylcellulose 0.5 % Soln Commonly known as: REFRESH PLUS Place 1 drop into both eyes 3 (three) times daily as needed (dry eyes).   cholecalciferol 25 MCG (1000 UNIT) tablet Commonly known as: VITAMIN D3 Take 2 tablets by mouth daily.   clopidogrel 75 MG tablet Commonly known as: Plavix Take 1 tablet (75 mg total) by mouth daily.   fenofibrate 54 MG tablet TAKE 2 TABLETS BY MOUTH DAILY   finasteride 5 MG tablet Commonly known as: PROSCAR TAKE 1 TABLET BY MOUTH DAILY   Lantus 100 UNIT/ML injection Generic drug: insulin glargine 34 U SQ once a day What changed: additional instructions   magnesium oxide 400 MG tablet Commonly known as: MAG-OX Take 800 mg by mouth at bedtime.   tamsulosin 0.4 MG Caps capsule Commonly known as: FLOMAX TAKE 2 CAPSULES BY MOUTH DAILY        Allergies:  Allergies  Allergen Reactions   Ivp Dye [Iodinated Contrast Media] Hives   Penicillins Hives   Ferrous Sulfate Diarrhea    Past Medical History, Surgical history, Social history, and  Family History were reviewed and updated.  Review of Systems: All other 10 point review of systems is negative.   Physical Exam:  height is 6' (1.829 m) and weight is 215 lb (97.5 kg). His oral temperature is 97.6 F (36.4 C). His blood pressure is 120/51 (abnormal) and his pulse is 71. His respiration is 16 and oxygen saturation is 98%.   Wt Readings from Last 3 Encounters:  04/28/23 215 lb (97.5 kg)  04/09/23 217 lb 1.9 oz (98.5 kg)  03/11/23 207 lb 1.3 oz (93.9 kg)    Ocular: Sclerae unicteric, pupils equal, round and reactive to light Ear-nose-throat: Oropharynx clear, dentition fair Lymphatic: No cervical or supraclavicular adenopathy Lungs no rales or rhonchi, good excursion bilaterally Heart regular  rate and rhythm, no murmur appreciated, 2+ pitting edema of bilateral lower legs.  Abd soft, nontender, positive bowel sounds MSK no focal spinal tenderness, no joint edema Neuro: non-focal, well-oriented, appropriate affect   Lab Results  Component Value Date   WBC 2.0 (L) 04/28/2023   HGB 10.6 (L) 04/28/2023   HCT 32.8 (L) 04/28/2023   MCV 97.3 04/28/2023   PLT 102 (L) 04/28/2023   Lab Results  Component Value Date   FERRITIN 39 04/09/2023   IRON 51 04/09/2023   TIBC 419 04/09/2023   UIBC 368 04/09/2023   IRONPCTSAT 12 (L) 04/09/2023   Lab Results  Component Value Date   RETICCTPCT 1.9 04/28/2023   RBC 3.32 (L) 04/28/2023   RBC 3.37 (L) 04/28/2023   No results found for: "KPAFRELGTCHN", "LAMBDASER", "KAPLAMBRATIO" No results found for: "IGGSERUM", "IGA", "IGMSERUM" No results found for: "TOTALPROTELP", "ALBUMINELP", "A1GS", "A2GS", "BETS", "BETA2SER", "GAMS", "MSPIKE", "SPEI"   Chemistry      Component Value Date/Time   NA 141 04/28/2023 1116   NA 143 10/21/2022 0000   K 4.5 04/28/2023 1116   CL 109 04/28/2023 1116   CO2 26 04/28/2023 1116   BUN 42 (H) 04/28/2023 1116   BUN 32 (A) 10/21/2022 0000   CREATININE 2.02 (H) 04/28/2023 1116   CREATININE 2.03 (H) 08/16/2022 1415   GLU 152 10/21/2022 0000      Component Value Date/Time   CALCIUM 9.5 04/28/2023 1116   ALKPHOS 56 04/28/2023 1116   AST 53 (H) 04/28/2023 1116   ALT 28 04/28/2023 1116   BILITOT 0.7 04/28/2023 1116      Impression and Wright: Dave Wright is a very pleasant 82 yo caucasian gentleman with 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  Counts have improved some since last visit which is reassuring. WBC up to 2.0 from 1.6. Neutrophil from 0.9 to 1.0. Platelet stable at 102. Hgb up to 10.6 from 9.7.   Creatinine slightly improved from 2.11 to 2.02 Since he is asymptomatic with the low ANC and WBC count we will just watch for now.  Reviewed red flags regarding his headache.  ESA given  for Hgb 10.6  Iron studies are pending. We will replace if needed.   Disposition  RTC 2 weeks APP, labs(CBC w/, CMP, LDH, retic, iron, ferritin), ESA-Dave  Rushie Chestnut, PA-C 6/10/20241:13 PM

## 2023-05-05 NOTE — Patient Instructions (Incomplete)

## 2023-05-06 ENCOUNTER — Ambulatory Visit (INDEPENDENT_AMBULATORY_CARE_PROVIDER_SITE_OTHER): Payer: Medicare Other | Admitting: Family Medicine

## 2023-05-06 ENCOUNTER — Encounter: Payer: Self-pay | Admitting: Family Medicine

## 2023-05-06 VITALS — BP 115/67 | HR 71 | Wt 212.0 lb

## 2023-05-06 DIAGNOSIS — E1121 Type 2 diabetes mellitus with diabetic nephropathy: Secondary | ICD-10-CM | POA: Diagnosis not present

## 2023-05-06 DIAGNOSIS — R188 Other ascites: Secondary | ICD-10-CM | POA: Diagnosis not present

## 2023-05-06 DIAGNOSIS — K760 Fatty (change of) liver, not elsewhere classified: Secondary | ICD-10-CM

## 2023-05-06 DIAGNOSIS — R600 Localized edema: Secondary | ICD-10-CM

## 2023-05-06 DIAGNOSIS — Z794 Long term (current) use of insulin: Secondary | ICD-10-CM

## 2023-05-06 DIAGNOSIS — R161 Splenomegaly, not elsewhere classified: Secondary | ICD-10-CM | POA: Diagnosis not present

## 2023-05-06 DIAGNOSIS — N1831 Chronic kidney disease, stage 3a: Secondary | ICD-10-CM

## 2023-05-06 LAB — COMPREHENSIVE METABOLIC PANEL
ALT: 36 U/L (ref 0–53)
AST: 60 U/L — ABNORMAL HIGH (ref 0–37)
Albumin: 3.5 g/dL (ref 3.5–5.2)
Alkaline Phosphatase: 58 U/L (ref 39–117)
BUN: 36 mg/dL — ABNORMAL HIGH (ref 6–23)
CO2: 25 mEq/L (ref 19–32)
Calcium: 9.2 mg/dL (ref 8.4–10.5)
Chloride: 107 mEq/L (ref 96–112)
Creatinine, Ser: 1.93 mg/dL — ABNORMAL HIGH (ref 0.40–1.50)
GFR: 31.55 mL/min — ABNORMAL LOW (ref >60.00)
Glucose, Bld: 183 mg/dL — ABNORMAL HIGH (ref 70–99)
Potassium: 4.5 mEq/L (ref 3.5–5.1)
Sodium: 139 mEq/L (ref 135–145)
Total Bilirubin: 1 mg/dL (ref 0.2–1.2)
Total Protein: 6 g/dL (ref 6.0–8.3)

## 2023-05-06 LAB — MICROALBUMIN / CREATININE URINE RATIO
Creatinine,U: 173.7 mg/dL
Microalb Creat Ratio: 0.9 mg/g (ref 0.0–30.0)
Microalb, Ur: 1.5 mg/dL (ref 0.0–1.9)

## 2023-05-06 LAB — POCT GLYCOSYLATED HEMOGLOBIN (HGB A1C)
HbA1c POC (<> result, manual entry): 5.2 % (ref 4.0–5.6)
HbA1c, POC (controlled diabetic range): 5.2 % (ref 0.0–7.0)
HbA1c, POC (prediabetic range): 5.2 % — AB (ref 5.7–6.4)
Hemoglobin A1C: 5.2 % (ref 4.0–5.6)

## 2023-05-06 LAB — PROTIME-INR
INR: 1.2 ratio — ABNORMAL HIGH (ref 0.8–1.0)
Prothrombin Time: 12.6 s (ref 9.6–13.1)

## 2023-05-06 MED ORDER — FUROSEMIDE 20 MG PO TABS: ORAL_TABLET | ORAL | 0 refills | Status: DC
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
## 2023-05-07 LAB — HEPATITIS B SURFACE ANTIGEN: Hepatitis B Surface Ag: NONREACTIVE

## 2023-05-07 LAB — HEPATITIS C ANTIBODY: Hepatitis C Ab: NONREACTIVE

## 2023-05-07 LAB — HEPATITIS B CORE ANTIBODY, IGM: Hep B C IgM: NONREACTIVE

## 2023-05-08 ENCOUNTER — Ambulatory Visit (HOSPITAL_BASED_OUTPATIENT_CLINIC_OR_DEPARTMENT_OTHER)
Admission: RE | Admit: 2023-05-08 | Discharge: 2023-05-08 | Disposition: A | Discharge status: Home / Self Care | Payer: Medicare Other | Source: Ambulatory Visit | Attending: Family Medicine | Admitting: Family Medicine

## 2023-05-08 DIAGNOSIS — K760 Fatty (change of) liver, not elsewhere classified: Secondary | ICD-10-CM | POA: Insufficient documentation

## 2023-05-08 DIAGNOSIS — R188 Other ascites: Secondary | ICD-10-CM | POA: Diagnosis present

## 2023-05-08 DIAGNOSIS — R161 Splenomegaly, not elsewhere classified: Secondary | ICD-10-CM | POA: Diagnosis present

## 2023-05-12 ENCOUNTER — Inpatient Hospital Stay (HOSPITAL_BASED_OUTPATIENT_CLINIC_OR_DEPARTMENT_OTHER): Payer: Medicare Other | Admitting: Medical Oncology

## 2023-05-12 ENCOUNTER — Other Ambulatory Visit: Payer: Self-pay | Admitting: Medical Oncology

## 2023-05-12 ENCOUNTER — Inpatient Hospital Stay: Payer: Medicare Other

## 2023-05-12 ENCOUNTER — Other Ambulatory Visit: Payer: Self-pay

## 2023-05-12 ENCOUNTER — Encounter: Payer: Self-pay | Admitting: Medical Oncology

## 2023-05-12 VITALS — BP 118/51 | HR 66 | Temp 97.7°F | Resp 20 | Ht 72.0 in | Wt 198.8 lb

## 2023-05-12 DIAGNOSIS — D72819 Decreased white blood cell count, unspecified: Secondary | ICD-10-CM

## 2023-05-12 DIAGNOSIS — D696 Thrombocytopenia, unspecified: Secondary | ICD-10-CM | POA: Diagnosis not present

## 2023-05-12 DIAGNOSIS — D509 Iron deficiency anemia, unspecified: Secondary | ICD-10-CM | POA: Diagnosis not present

## 2023-05-12 DIAGNOSIS — N184 Chronic kidney disease, stage 4 (severe): Secondary | ICD-10-CM

## 2023-05-12 DIAGNOSIS — D631 Anemia in chronic kidney disease: Secondary | ICD-10-CM

## 2023-05-12 DIAGNOSIS — D61818 Other pancytopenia: Secondary | ICD-10-CM | POA: Diagnosis not present

## 2023-05-12 LAB — RETIC PANEL
Immature Retic Fract: 7.4 % (ref 2.3–15.9)
RBC.: 3.54 MIL/uL — ABNORMAL LOW (ref 4.22–5.81)
Retic Count, Absolute: 39.6 10*3/uL (ref 19.0–186.0)
Retic Ct Pct: 1.1 % (ref 0.4–3.1)
Reticulocyte Hemoglobin: 32.8 pg (ref >27.9)

## 2023-05-12 LAB — CBC WITH DIFFERENTIAL (CANCER CENTER ONLY)
Abs Immature Granulocytes: 0.03 10*3/uL (ref 0.00–0.07)
Basophils Absolute: 0 10*3/uL (ref 0.0–0.1)
Basophils Relative: 1 %
Eosinophils Absolute: 0.1 10*3/uL (ref 0.0–0.5)
Eosinophils Relative: 2 %
HCT: 34.7 % — ABNORMAL LOW (ref 39.0–52.0)
Hemoglobin: 10.9 g/dL — ABNORMAL LOW (ref 13.0–17.0)
Immature Granulocytes: 1 %
Lymphocytes Relative: 21 %
Lymphs Abs: 0.5 10*3/uL — ABNORMAL LOW (ref 0.7–4.0)
MCH: 30.5 pg (ref 26.0–34.0)
MCHC: 31.4 g/dL (ref 30.0–36.0)
MCV: 97.2 fL (ref 80.0–100.0)
Monocytes Absolute: 0.4 10*3/uL (ref 0.1–1.0)
Monocytes Relative: 18 %
Neutro Abs: 1.3 10*3/uL — ABNORMAL LOW (ref 1.7–7.7)
Neutrophils Relative %: 57 %
Platelet Count: 104 10*3/uL — ABNORMAL LOW (ref 150–400)
RBC: 3.57 MIL/uL — ABNORMAL LOW (ref 4.22–5.81)
RDW: 17 % — ABNORMAL HIGH (ref 11.5–15.5)
WBC Count: 2.2 10*3/uL — ABNORMAL LOW (ref 4.0–10.5)
nRBC: 0 % (ref 0.0–0.2)

## 2023-05-12 LAB — CMP (CANCER CENTER ONLY)
ALT: 34 U/L (ref 0–44)
AST: 61 U/L — ABNORMAL HIGH (ref 15–41)
Albumin: 3.7 g/dL (ref 3.5–5.0)
Alkaline Phosphatase: 55 U/L (ref 38–126)
Anion gap: 8 (ref 5–15)
BUN: 50 mg/dL — ABNORMAL HIGH (ref 8–23)
CO2: 28 mmol/L (ref 22–32)
Calcium: 10.1 mg/dL (ref 8.9–10.3)
Chloride: 105 mmol/L (ref 98–111)
Creatinine: 2.18 mg/dL — ABNORMAL HIGH (ref 0.61–1.24)
GFR, Estimated: 29 mL/min — ABNORMAL LOW (ref >60)
Glucose, Bld: 176 mg/dL — ABNORMAL HIGH (ref 70–99)
Potassium: 4.4 mmol/L (ref 3.5–5.1)
Sodium: 141 mmol/L (ref 135–145)
Total Bilirubin: 1 mg/dL (ref 0.3–1.2)
Total Protein: 5.8 g/dL — ABNORMAL LOW (ref 6.5–8.1)

## 2023-05-12 LAB — FERRITIN: Ferritin: 329 ng/mL (ref 24–336)

## 2023-05-12 LAB — IRON AND IRON BINDING CAPACITY (CC-WL,HP ONLY)
Iron: 196 ug/dL — ABNORMAL HIGH (ref 45–182)
Saturation Ratios: 56 % — ABNORMAL HIGH (ref 17.9–39.5)
TIBC: 351 ug/dL (ref 250–450)
UIBC: 155 ug/dL (ref 117–376)

## 2023-05-12 LAB — LACTATE DEHYDROGENASE: LDH: 158 U/L (ref 98–192)

## 2023-05-12 MED ORDER — FAMOTIDINE IN NACL 20-0.9 MG/50ML-% IV SOLN: 20.0000 mg | Freq: Once | INTRAVENOUS | Status: DC | PRN

## 2023-05-12 MED ORDER — SODIUM CHLORIDE 0.9% FLUSH: 3.0000 mL | Freq: Once | INTRAVENOUS | Status: DC | PRN

## 2023-05-12 MED ORDER — METHYLPREDNISOLONE SODIUM SUCC 125 MG IJ SOLR: 125.0000 mg | Freq: Once | INTRAMUSCULAR | Status: DC | PRN

## 2023-05-12 MED ORDER — EPINEPHRINE 0.3 MG/0.3ML IJ SOAJ: 0.3000 mg | Freq: Once | INTRAMUSCULAR | Status: DC | PRN

## 2023-05-12 MED ORDER — RETACRIT 10000 UNIT/ML IJ SOLN
10000.0000 [IU] | Freq: Once | INTRAMUSCULAR | 0 refills | Status: AC
Start: 2023-05-12 — End: 2023-05-12

## 2023-05-12 MED ORDER — SODIUM CHLORIDE 0.9% FLUSH: 10.0000 mL | Freq: Once | INTRAVENOUS | Status: DC | PRN

## 2023-05-12 MED ORDER — DIPHENHYDRAMINE HCL 50 MG/ML IJ SOLN: 50.0000 mg | Freq: Once | INTRAMUSCULAR | Status: DC | PRN

## 2023-05-12 MED ORDER — ALBUTEROL SULFATE HFA 108 (90 BASE) MCG/ACT IN AERS: 2.0000 | INHALATION_SPRAY | Freq: Once | RESPIRATORY_TRACT | Status: DC | PRN

## 2023-05-12 MED ORDER — SODIUM CHLORIDE 0.9 % IV SOLN: Freq: Once | INTRAVENOUS | Status: DC | PRN

## 2023-05-12 MED ORDER — EPOETIN ALFA-EPBX 10000 UNIT/ML IJ SOLN
10000.0000 [IU] | Freq: Once | INTRAMUSCULAR | Status: AC
  Administered 2023-05-12: 10000 [IU] via SUBCUTANEOUS
  Filled 2023-05-12: qty 1

## 2023-05-12 NOTE — Progress Notes (Signed)
Hematology and Oncology Follow Up Visit  Dave Wright [id number] 11-Jun-1941 82 y.o. 05/12/2023  Principle Diagnosis:  Pancytopenia   Current Therapy:   Retacrit injection for Hgb < 11   Interim History:  Dave Wright is here today with his wife for follow-up and ESA injection.   They reports that he has been doing ok. A bit tired today but he has these days fairly frequently. Since his last visit he has seen hi PCP who increased his Lasix. This has helped with his lower leg edema.   No blood loss noted. No abnormal bruising, no petechiae.  He has had no issue with infections. No fever, chills, n/v, cough, rash, dizziness, chest pain, palpitations, abdominal pain or changes in bowel or bladder habits.  He has some mild SOB with over exertion and takes a break to rest when needed.   No falls or syncope reported.  Appetite and hydration are good.  Wt Readings from Last 3 Encounters:  05/12/23 198 lb 12.8 oz (90.2 kg)  05/06/23 212 lb (96.2 kg)  04/28/23 215 lb (97.5 kg)   ECOG Performance Status: 1 - Symptomatic but completely ambulatory  Medications:  Allergies as of 05/12/2023       Reactions   Ivp Dye [iodinated Contrast Media] Hives   Penicillins Hives   Ferrous Sulfate Diarrhea        Medication List        Accurate as of May 12, 2023  1:12 PM. If you have any questions, ask your nurse or doctor.          acetaminophen 325 MG tablet Commonly known as: TYLENOL Take 650 mg by mouth every 6 (six) hours as needed for moderate pain.   allopurinol 100 MG tablet Commonly known as: ZYLOPRIM Take 1 tablet (100 mg total) by mouth daily.   aspirin EC 81 MG tablet Take 81 mg by mouth daily. Swallow whole.   atorvastatin 10 MG tablet Commonly known as: LIPITOR TAKE 1 TABLET BY MOUTH DAILY   carboxymethylcellulose 0.5 % Soln Commonly known as: REFRESH PLUS Place 1 drop into both eyes 3 (three) times daily as needed (dry eyes).   cholecalciferol 25 MCG  (1000 UNIT) tablet Commonly known as: VITAMIN D3 Take 2 tablets by mouth daily.   clopidogrel 75 MG tablet Commonly known as: Plavix Take 1 tablet (75 mg total) by mouth daily.   fenofibrate 54 MG tablet TAKE 2 TABLETS BY MOUTH DAILY   finasteride 5 MG tablet Commonly known as: PROSCAR TAKE 1 TABLET BY MOUTH DAILY   furosemide 20 MG tablet Commonly known as: LASIX 2 tabs po qd   Lantus 100 UNIT/ML injection Generic drug: insulin glargine 34 U SQ once a day What changed: additional instructions   magnesium oxide 400 MG tablet Commonly known as: MAG-OX Take 800 mg by mouth at bedtime.   Retacrit 40981 UNIT/ML injection Generic drug: epoetin alfa-epbx Inject 1 mL (10,000 Units total) into the skin once for 1 dose.   tamsulosin 0.4 MG Caps capsule Commonly known as: FLOMAX TAKE 2 CAPSULES BY MOUTH DAILY        Allergies:  Allergies  Allergen Reactions   Ivp Dye [Iodinated Contrast Media] Hives   Penicillins Hives   Ferrous Sulfate Diarrhea    Past Medical History, Surgical history, Social history, and Family History were reviewed and updated.  Review of Systems: All other 10 point review of systems is negative.   Physical Exam:  height is 6' (1.829 m) and  weight is 198 lb 12.8 oz (90.2 kg). His oral temperature is 97.7 F (36.5 C). His blood pressure is 118/51 (abnormal) and his pulse is 66. His respiration is 20 and oxygen saturation is 96%.   Wt Readings from Last 3 Encounters:  05/12/23 198 lb 12.8 oz (90.2 kg)  05/06/23 212 lb (96.2 kg)  04/28/23 215 lb (97.5 kg)    Ocular: Sclerae unicteric, pupils equal, round and reactive to light Ear-nose-throat: Oropharynx clear, dentition fair Lymphatic: No cervical or supraclavicular adenopathy Lungs no rales or rhonchi, good excursion bilaterally Heart regular rate and rhythm, no murmur appreciated, 2+ pitting edema of bilateral lower legs.  Abd soft, nontender, positive bowel sounds MSK no focal spinal  tenderness, no joint edema Neuro: non-focal, well-oriented, appropriate affect   Lab Results  Component Value Date   WBC 2.2 (L) 05/12/2023   HGB 10.9 (L) 05/12/2023   HCT 34.7 (L) 05/12/2023   MCV 97.2 05/12/2023   PLT 104 (L) 05/12/2023   Lab Results  Component Value Date   FERRITIN 623 04/28/2023   IRON 165 04/28/2023   TIBC 365 04/28/2023   UIBC 200 04/28/2023   IRONPCTSAT 45% 04/28/2023   Lab Results  Component Value Date   RETICCTPCT 1.1 05/12/2023   RBC 3.57 (L) 05/12/2023   RBC 3.54 (L) 05/12/2023   No results found for: "KPAFRELGTCHN", "LAMBDASER", "KAPLAMBRATIO" No results found for: "IGGSERUM", "IGA", "IGMSERUM" No results found for: "TOTALPROTELP", "ALBUMINELP", "A1GS", "A2GS", "BETS", "BETA2SER", "GAMS", "MSPIKE", "SPEI"   Chemistry      Component Value Date/Time   NA 141 05/12/2023 1031   NA 141 04/28/2023 0000   K 4.4 05/12/2023 1031   CL 105 05/12/2023 1031   CO2 28 05/12/2023 1031   BUN 50 (H) 05/12/2023 1031   BUN 39 (A) 04/28/2023 1115   CREATININE 2.18 (H) 05/12/2023 1031   CREATININE 2.03 (H) 08/16/2022 1415   GLU 128 04/28/2023 0000      Component Value Date/Time   CALCIUM 10.1 05/12/2023 1031   ALKPHOS 55 05/12/2023 1031   AST 61 (H) 05/12/2023 1031   ALT 34 05/12/2023 1031   BILITOT 1.0 05/12/2023 1031      Impression and Plan: Dave Wright is a very pleasant 82 yo caucasian gentleman with 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  Counts have improved some since last visit which is reassuring. WBC up to 2.2 from 1.6. Neutrophil from 0.9 to 1.3. Platelet stable at 104. Hgb up to 10.9 from 9.7.   Creatinine slightly improved from 2.18 to 2.02 ESA given for Hgb 10.9  Iron studies are pending. We will replace if needed.   Disposition  RTC 2 weeks APP, labs(CBC w/, CMP, LDH, retic, iron, ferritin), ESA-Dave Wright  Dave Chestnut, PA-C 6/24/20241:12 PM

## 2023-05-12 NOTE — Patient Instructions (Signed)

## 2023-05-13 ENCOUNTER — Encounter: Payer: Self-pay | Admitting: Family Medicine

## 2023-05-13 ENCOUNTER — Ambulatory Visit (INDEPENDENT_AMBULATORY_CARE_PROVIDER_SITE_OTHER): Payer: Medicare Other | Admitting: Family Medicine

## 2023-05-13 VITALS — BP 120/63 | HR 65 | Wt 199.2 lb

## 2023-05-13 DIAGNOSIS — K760 Fatty (change of) liver, not elsewhere classified: Secondary | ICD-10-CM

## 2023-05-13 DIAGNOSIS — E041 Nontoxic single thyroid nodule: Secondary | ICD-10-CM

## 2023-05-13 DIAGNOSIS — K7460 Unspecified cirrhosis of liver: Secondary | ICD-10-CM | POA: Diagnosis not present

## 2023-05-13 DIAGNOSIS — R600 Localized edema: Secondary | ICD-10-CM

## 2023-05-13 MED ORDER — FUROSEMIDE 20 MG PO TABS: ORAL_TABLET | ORAL | 1 refills | Status: DC
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
## 2023-05-16 ENCOUNTER — Other Ambulatory Visit: Payer: Self-pay | Admitting: Family

## 2023-05-16 ENCOUNTER — Ambulatory Visit (HOSPITAL_BASED_OUTPATIENT_CLINIC_OR_DEPARTMENT_OTHER)
Admission: RE | Admit: 2023-05-16 | Discharge: 2023-05-16 | Disposition: A | Discharge status: Home / Self Care | Payer: Medicare Other | Source: Ambulatory Visit | Attending: Family Medicine | Admitting: Family Medicine

## 2023-05-16 DIAGNOSIS — E041 Nontoxic single thyroid nodule: Secondary | ICD-10-CM | POA: Diagnosis present

## 2023-05-24 ENCOUNTER — Other Ambulatory Visit: Payer: Self-pay | Admitting: Family Medicine

## 2023-05-24 ENCOUNTER — Encounter: Payer: Self-pay | Admitting: Family Medicine

## 2023-05-24 DIAGNOSIS — E041 Nontoxic single thyroid nodule: Secondary | ICD-10-CM

## 2023-05-26 ENCOUNTER — Inpatient Hospital Stay: Payer: Medicare Other

## 2023-05-26 ENCOUNTER — Inpatient Hospital Stay: Payer: Medicare Other | Attending: Hematology & Oncology

## 2023-05-26 ENCOUNTER — Other Ambulatory Visit: Payer: Self-pay | Admitting: Medical Oncology

## 2023-05-26 DIAGNOSIS — D631 Anemia in chronic kidney disease: Secondary | ICD-10-CM | POA: Diagnosis not present

## 2023-05-26 DIAGNOSIS — Z79899 Other long term (current) drug therapy: Secondary | ICD-10-CM | POA: Diagnosis not present

## 2023-05-26 DIAGNOSIS — D61818 Other pancytopenia: Secondary | ICD-10-CM | POA: Insufficient documentation

## 2023-05-26 DIAGNOSIS — N184 Chronic kidney disease, stage 4 (severe): Secondary | ICD-10-CM | POA: Diagnosis not present

## 2023-05-26 LAB — CMP (CANCER CENTER ONLY)
ALT: 40 U/L (ref 0–44)
AST: 62 U/L — ABNORMAL HIGH (ref 15–41)
Albumin: 4 g/dL (ref 3.5–5.0)
Alkaline Phosphatase: 51 U/L (ref 38–126)
Anion gap: 9 (ref 5–15)
BUN: 53 mg/dL — ABNORMAL HIGH (ref 8–23)
CO2: 27 mmol/L (ref 22–32)
Calcium: 10.1 mg/dL (ref 8.9–10.3)
Chloride: 107 mmol/L (ref 98–111)
Creatinine: 2.14 mg/dL — ABNORMAL HIGH (ref 0.61–1.24)
GFR, Estimated: 30 mL/min — ABNORMAL LOW (ref >60)
Glucose, Bld: 199 mg/dL — ABNORMAL HIGH (ref 70–99)
Potassium: 4.4 mmol/L (ref 3.5–5.1)
Sodium: 143 mmol/L (ref 135–145)
Total Bilirubin: 1 mg/dL (ref 0.3–1.2)
Total Protein: 6.6 g/dL (ref 6.5–8.1)

## 2023-05-26 LAB — CBC WITH DIFFERENTIAL (CANCER CENTER ONLY)
Abs Immature Granulocytes: 0.02 10*3/uL (ref 0.00–0.07)
Basophils Absolute: 0 10*3/uL (ref 0.0–0.1)
Basophils Relative: 1 %
Eosinophils Absolute: 0.1 10*3/uL (ref 0.0–0.5)
Eosinophils Relative: 3 %
HCT: 38.8 % — ABNORMAL LOW (ref 39.0–52.0)
Hemoglobin: 12.2 g/dL — ABNORMAL LOW (ref 13.0–17.0)
Immature Granulocytes: 1 %
Lymphocytes Relative: 23 %
Lymphs Abs: 0.8 10*3/uL (ref 0.7–4.0)
MCH: 31.2 pg (ref 26.0–34.0)
MCHC: 31.4 g/dL (ref 30.0–36.0)
MCV: 99.2 fL (ref 80.0–100.0)
Monocytes Absolute: 0.5 10*3/uL (ref 0.1–1.0)
Monocytes Relative: 15 %
Neutro Abs: 1.9 10*3/uL (ref 1.7–7.7)
Neutrophils Relative %: 57 %
Platelet Count: 110 10*3/uL — ABNORMAL LOW (ref 150–400)
RBC: 3.91 MIL/uL — ABNORMAL LOW (ref 4.22–5.81)
RDW: 17.2 % — ABNORMAL HIGH (ref 11.5–15.5)
WBC Count: 3.3 10*3/uL — ABNORMAL LOW (ref 4.0–10.5)
nRBC: 0 % (ref 0.0–0.2)

## 2023-05-26 NOTE — Progress Notes (Signed)
Hbg 12.2. no injection indicated. Gave pt and wife copy of labs in waiting room

## 2023-05-29 ENCOUNTER — Ambulatory Visit: Payer: Medicare Other | Admitting: Family Medicine

## 2023-06-25 ENCOUNTER — Other Ambulatory Visit: Payer: Self-pay | Admitting: Family Medicine

## 2023-06-30 ENCOUNTER — Encounter: Payer: Self-pay | Admitting: Physician Assistant

## 2023-06-30 ENCOUNTER — Ambulatory Visit (INDEPENDENT_AMBULATORY_CARE_PROVIDER_SITE_OTHER): Payer: Medicare Other | Admitting: Physician Assistant

## 2023-06-30 ENCOUNTER — Other Ambulatory Visit (INDEPENDENT_AMBULATORY_CARE_PROVIDER_SITE_OTHER): Payer: Medicare Other

## 2023-06-30 DIAGNOSIS — G8929 Other chronic pain: Secondary | ICD-10-CM | POA: Diagnosis not present

## 2023-06-30 DIAGNOSIS — M1711 Unilateral primary osteoarthritis, right knee: Secondary | ICD-10-CM

## 2023-06-30 DIAGNOSIS — M25561 Pain in right knee: Secondary | ICD-10-CM

## 2023-06-30 MED ORDER — METHYLPREDNISOLONE ACETATE 40 MG/ML IJ SUSP
40.0000 mg | INTRAMUSCULAR | Status: AC | PRN
Start: 2023-06-30 — End: 2023-06-30
  Administered 2023-06-30: 40 mg via INTRA_ARTICULAR

## 2023-06-30 MED ORDER — LIDOCAINE HCL 1 % IJ SOLN
3.0000 mL | INTRAMUSCULAR | Status: AC | PRN
Start: 2023-06-30 — End: 2023-06-30
  Administered 2023-06-30: 3 mL
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
## 2023-07-02 ENCOUNTER — Other Ambulatory Visit: Payer: Self-pay | Admitting: Student

## 2023-07-02 DIAGNOSIS — I771 Stricture of artery: Secondary | ICD-10-CM

## 2023-07-02 MED ORDER — CLOPIDOGREL BISULFATE 75 MG PO TABS
75.0000 mg | ORAL_TABLET | Freq: Every day | ORAL | 2 refills | Status: DC
Start: 2023-07-02 — End: 2023-12-31

## 2023-07-02 NOTE — Progress Notes (Signed)
Interventional Radiology Brief Note:  Refill request received for Plavix 75mg  daily.  Patient also due to repeat surveillance imaging.  Have requested scheduler re-attempt to contact patient to arrange.   Loyce Dys, MS RD PA-C

## 2023-07-03 ENCOUNTER — Encounter (INDEPENDENT_AMBULATORY_CARE_PROVIDER_SITE_OTHER): Payer: Self-pay

## 2023-07-03 ENCOUNTER — Telehealth (HOSPITAL_COMMUNITY): Payer: Self-pay

## 2023-07-03 ENCOUNTER — Other Ambulatory Visit (HOSPITAL_COMMUNITY): Payer: Self-pay | Admitting: Interventional Radiology

## 2023-07-03 DIAGNOSIS — I771 Stricture of artery: Secondary | ICD-10-CM

## 2023-07-03 NOTE — Telephone Encounter (Signed)
Called to schedule cta, no answer, left vm. AB 

## 2023-07-14 ENCOUNTER — Encounter: Payer: Self-pay | Admitting: Physician Assistant

## 2023-07-14 ENCOUNTER — Ambulatory Visit (INDEPENDENT_AMBULATORY_CARE_PROVIDER_SITE_OTHER): Payer: Medicare Other | Admitting: Physician Assistant

## 2023-07-14 DIAGNOSIS — M1712 Unilateral primary osteoarthritis, left knee: Secondary | ICD-10-CM

## 2023-07-14 MED ORDER — METHYLPREDNISOLONE ACETATE 40 MG/ML IJ SUSP
40.0000 mg | INTRAMUSCULAR | Status: AC | PRN
Start: 2023-07-14 — End: 2023-07-14
  Administered 2023-07-14: 40 mg via INTRA_ARTICULAR

## 2023-07-14 MED ORDER — LIDOCAINE HCL 1 % IJ SOLN
3.0000 mL | INTRAMUSCULAR | Status: AC | PRN
Start: 2023-07-14 — End: 2023-07-14
  Administered 2023-07-14: 3 mL

## 2023-07-14 NOTE — Progress Notes (Signed)
   Procedure Note  Patient: Dave Wright             Date of Birth: 09-23-1941           MRN: [id number]             Visit Date: 07/14/2023 HPI: Mr. Broccoli comes in today requesting left knee cortisone injection.  States that the right knee injection helped him greatly.  He does note that it did raise glucose levels to approximately 180 for a day but otherwise no adverse effects.  He has had no new injury to either knee.  Physical exam: General: Well-developed well-nourished male who ambulates with a cane. Left knee: No abnormal warmth erythema or effusion.  Procedures: Visit Diagnoses:  1. Primary osteoarthritis of left knee     Large Joint Inj: L knee on 07/14/2023 10:48 AM Indications: pain Details: 22 G 1.5 in needle, anterolateral approach  Arthrogram: No  Medications: 3 mL lidocaine 1 %; 40 mg methylPREDNISolone acetate 40 MG/ML Outcome: tolerated well, no immediate complications Procedure, treatment alternatives, risks and benefits explained, specific risks discussed. Consent was given by the patient. Immediately prior to procedure a time out was called to verify the correct patient, procedure, equipment, support staff and site/side marked as required. Patient was prepped and draped in the usual sterile fashion.     Plan: He understands to wait 3 months between cortisone injections.  He will follow-up with [place] on an as-needed basis pain persist or becomes worse.  Questions were encouraged and answered at length.

## 2023-07-15 ENCOUNTER — Ambulatory Visit (HOSPITAL_COMMUNITY)
Admission: RE | Admit: 2023-07-15 | Discharge: 2023-07-15 | Disposition: A | Discharge status: Home / Self Care | Payer: Medicare Other | Source: Ambulatory Visit | Attending: Interventional Radiology | Admitting: Interventional Radiology

## 2023-07-15 ENCOUNTER — Encounter (HOSPITAL_COMMUNITY): Payer: Self-pay

## 2023-07-15 ENCOUNTER — Other Ambulatory Visit: Payer: Self-pay | Admitting: Student

## 2023-07-15 DIAGNOSIS — I771 Stricture of artery: Secondary | ICD-10-CM | POA: Diagnosis present

## 2023-07-15 DIAGNOSIS — N184 Chronic kidney disease, stage 4 (severe): Secondary | ICD-10-CM | POA: Diagnosis present

## 2023-07-15 DIAGNOSIS — G450 Vertebro-basilar artery syndrome: Secondary | ICD-10-CM

## 2023-07-15 LAB — POCT I-STAT CREATININE: Creatinine, Ser: 2.1 mg/dL — ABNORMAL HIGH (ref 0.61–1.24)

## 2023-07-15 MED ORDER — PREDNISONE 50 MG PO TABS
ORAL_TABLET | ORAL | 0 refills | Status: DC
Start: 2023-07-15 — End: 2023-08-08

## 2023-07-15 MED ORDER — DIPHENHYDRAMINE HCL 50 MG PO TABS
ORAL_TABLET | ORAL | 0 refills | Status: DC
Start: 2023-07-15 — End: 2023-08-08

## 2023-07-15 MED ORDER — SODIUM CHLORIDE (PF) 0.9 % IJ SOLN
INTRAMUSCULAR | Status: AC
  Filled 2023-07-15: qty 50

## 2023-07-15 NOTE — Progress Notes (Signed)
Interventional Radiology Brief Note:  Script for contrast allergy premedication called into patient's preferred pharmacy-  [facility] in [place].   Loyce Dys, MS RD PA-C

## 2023-07-16 ENCOUNTER — Other Ambulatory Visit: Payer: Self-pay | Admitting: Family Medicine

## 2023-07-18 ENCOUNTER — Other Ambulatory Visit (HOSPITAL_COMMUNITY)
Admission: RE | Admit: 2023-07-18 | Discharge: 2023-07-18 | Disposition: A | Discharge status: Home / Self Care | Payer: Medicare Other | Source: Ambulatory Visit | Attending: Interventional Radiology | Admitting: Interventional Radiology

## 2023-07-18 ENCOUNTER — Ambulatory Visit
Admission: RE | Admit: 2023-07-18 | Discharge: 2023-07-18 | Disposition: A | Discharge status: Home / Self Care | Payer: Medicare Other | Source: Ambulatory Visit | Attending: Family Medicine | Admitting: Family Medicine

## 2023-07-18 DIAGNOSIS — E041 Nontoxic single thyroid nodule: Secondary | ICD-10-CM | POA: Diagnosis present

## 2023-07-24 ENCOUNTER — Encounter: Payer: Self-pay | Admitting: Family Medicine

## 2023-07-24 LAB — CYTOLOGY - NON PAP

## 2023-07-25 ENCOUNTER — Ambulatory Visit: Payer: Medicare Other | Admitting: Family Medicine

## 2023-07-28 ENCOUNTER — Encounter (HOSPITAL_COMMUNITY): Payer: Self-pay

## 2023-07-28 ENCOUNTER — Ambulatory Visit (HOSPITAL_COMMUNITY)
Admission: RE | Admit: 2023-07-28 | Discharge: 2023-07-28 | Disposition: A | Discharge status: Home / Self Care | Payer: Medicare Other | Source: Ambulatory Visit | Attending: Interventional Radiology | Admitting: Interventional Radiology

## 2023-07-28 DIAGNOSIS — E119 Type 2 diabetes mellitus without complications: Secondary | ICD-10-CM | POA: Diagnosis present

## 2023-07-28 DIAGNOSIS — I771 Stricture of artery: Secondary | ICD-10-CM | POA: Diagnosis present

## 2023-07-28 LAB — POCT I-STAT CREATININE: Creatinine, Ser: 2.1 mg/dL — ABNORMAL HIGH (ref 0.61–1.24)

## 2023-07-28 MED ORDER — IOHEXOL 350 MG/ML SOLN
75.0000 mL | Freq: Once | INTRAVENOUS | Status: AC | PRN
  Administered 2023-07-28: 75 mL via INTRAVENOUS

## 2023-07-28 MED ORDER — SODIUM CHLORIDE (PF) 0.9 % IJ SOLN
INTRAMUSCULAR | Status: AC
  Filled 2023-07-28: qty 50

## 2023-07-28 MED ORDER — DIPHENHYDRAMINE HCL 25 MG PO CAPS
50.0000 mg | ORAL_CAPSULE | Freq: Once | ORAL | Status: AC
  Administered 2023-07-28: 50 mg via ORAL

## 2023-07-28 MED ORDER — DIPHENHYDRAMINE HCL 25 MG PO CAPS
ORAL_CAPSULE | ORAL | Status: AC
  Filled 2023-07-28: qty 2

## 2023-08-08 NOTE — Patient Instructions (Incomplete)

## 2023-08-11 ENCOUNTER — Encounter: Payer: Self-pay | Admitting: Family Medicine

## 2023-08-11 ENCOUNTER — Encounter: Payer: Self-pay | Admitting: Gastroenterology

## 2023-08-11 ENCOUNTER — Ambulatory Visit (INDEPENDENT_AMBULATORY_CARE_PROVIDER_SITE_OTHER): Payer: Medicare Other | Admitting: Family Medicine

## 2023-08-11 VITALS — BP 134/65 | HR 73 | Wt 200.0 lb

## 2023-08-11 DIAGNOSIS — E7800 Pure hypercholesterolemia, unspecified: Secondary | ICD-10-CM | POA: Diagnosis not present

## 2023-08-11 DIAGNOSIS — Z23 Encounter for immunization: Secondary | ICD-10-CM | POA: Diagnosis not present

## 2023-08-11 DIAGNOSIS — Z794 Long term (current) use of insulin: Secondary | ICD-10-CM | POA: Diagnosis not present

## 2023-08-11 DIAGNOSIS — N1832 Chronic kidney disease, stage 3b: Secondary | ICD-10-CM

## 2023-08-11 DIAGNOSIS — R600 Localized edema: Secondary | ICD-10-CM | POA: Diagnosis not present

## 2023-08-11 DIAGNOSIS — M1A00X0 Idiopathic chronic gout, unspecified site, without tophus (tophi): Secondary | ICD-10-CM

## 2023-08-11 DIAGNOSIS — E1121 Type 2 diabetes mellitus with diabetic nephropathy: Secondary | ICD-10-CM

## 2023-08-11 LAB — POCT GLYCOSYLATED HEMOGLOBIN (HGB A1C)
HbA1c POC (<> result, manual entry): 5.8 % (ref 4.0–5.6)
HbA1c, POC (controlled diabetic range): 5.8 % (ref 0.0–7.0)
HbA1c, POC (prediabetic range): 5.8 % (ref 5.7–6.4)
Hemoglobin A1C: 5.8 % — AB (ref 4.0–5.6)

## 2023-08-11 LAB — COMPREHENSIVE METABOLIC PANEL
ALT: 33 U/L (ref 0–53)
AST: 51 U/L — ABNORMAL HIGH (ref 0–37)
Albumin: 3.5 g/dL (ref 3.5–5.2)
Alkaline Phosphatase: 50 U/L (ref 39–117)
BUN: 35 mg/dL — ABNORMAL HIGH (ref 6–23)
CO2: 25 mEq/L (ref 19–32)
Calcium: 9.5 mg/dL (ref 8.4–10.5)
Chloride: 109 mEq/L (ref 96–112)
Creatinine, Ser: 1.75 mg/dL — ABNORMAL HIGH (ref 0.40–1.50)
GFR: 35.42 mL/min — ABNORMAL LOW (ref >60.00)
Glucose, Bld: 183 mg/dL — ABNORMAL HIGH (ref 70–99)
Potassium: 4.2 mEq/L (ref 3.5–5.1)
Sodium: 141 mEq/L (ref 135–145)
Total Bilirubin: 1.1 mg/dL (ref 0.2–1.2)
Total Protein: 5.6 g/dL — ABNORMAL LOW (ref 6.0–8.3)

## 2023-08-11 MED ORDER — LANTUS 100 UNIT/ML ~~LOC~~ SOLN: SUBCUTANEOUS | Status: DC

## 2023-08-11 MED ORDER — FINASTERIDE 5 MG PO TABS: 5.0000 mg | ORAL_TABLET | Freq: Every day | ORAL | 1 refills | Status: DC

## 2023-08-11 MED ORDER — ATORVASTATIN CALCIUM 10 MG PO TABS: 10.0000 mg | ORAL_TABLET | Freq: Every day | ORAL | 1 refills | Status: DC
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
## 2023-08-17 ENCOUNTER — Other Ambulatory Visit: Payer: Self-pay | Admitting: Family Medicine

## 2023-08-21 ENCOUNTER — Other Ambulatory Visit: Payer: Self-pay | Admitting: Family Medicine

## 2023-08-21 ENCOUNTER — Telehealth: Payer: Self-pay | Admitting: Family Medicine

## 2023-08-21 NOTE — Telephone Encounter (Signed)
Prescription Request  08/21/2023  LOV: 08/11/2023  What is the name of the medication or equipment? allopurinol (ZYLOPRIM) 100 MG tablet,    atorvastatin (LIPITOR) 10 MG tablet  [facility] is the pharmacy these two medications should be sent to. With 90 day supply.   Have you contacted your pharmacy to request a refill? Yes   Which pharmacy would you like this sent to?  [facility] [address] [place] - [address] [place] [address] Phone: [phone] Fax: [phone]  [facility] - [place], [place] - [address] [facility] [address] [place] [address] [place] [address] Phone: [phone] Fax: [phone]  [facility] - [place], [place] - [address] [place] [address] Phone: [phone] Fax: [phone]  [facility] [place], [place] - [address] [place] [address] Phone: [phone] Fax: [phone]    Patient notified that their request is being sent to the clinical staff for review and that they should receive a response within 2 business days.   Please advise at Mobile [phone] (mobile)

## 2023-08-25 ENCOUNTER — Telehealth (HOSPITAL_COMMUNITY): Payer: Self-pay

## 2023-08-25 ENCOUNTER — Other Ambulatory Visit: Payer: Self-pay | Admitting: Family Medicine

## 2023-08-25 ENCOUNTER — Other Ambulatory Visit (HOSPITAL_COMMUNITY): Payer: Self-pay | Admitting: Interventional Radiology

## 2023-08-25 DIAGNOSIS — I771 Stricture of artery: Secondary | ICD-10-CM

## 2023-08-25 NOTE — Telephone Encounter (Signed)
Patient wife called to follow up on prescription request for allopurinol (ZYLOPRIM) 100 MG tablet and atorvastatin (LIPITOR) 10 MG tablet  [facility] is the correct pharmacy to send to

## 2023-08-25 NOTE — Telephone Encounter (Signed)
Called to schedule a consult, no answer, left vm. AB

## 2023-08-25 NOTE — Telephone Encounter (Signed)
Patients wife called to follow up previous prescription request for

## 2023-08-27 ENCOUNTER — Telehealth (HOSPITAL_COMMUNITY): Payer: Self-pay

## 2023-08-27 ENCOUNTER — Other Ambulatory Visit: Payer: Self-pay

## 2023-08-27 MED ORDER — FENOFIBRATE 54 MG PO TABS: ORAL_TABLET | ORAL | 0 refills | Status: DC

## 2023-08-27 MED ORDER — FINASTERIDE 5 MG PO TABS: 5.0000 mg | ORAL_TABLET | Freq: Every day | ORAL | 0 refills | Status: DC

## 2023-08-27 MED ORDER — TAMSULOSIN HCL 0.4 MG PO CAPS: 0.8000 mg | ORAL_CAPSULE | Freq: Every day | ORAL | 0 refills | Status: DC

## 2023-08-27 MED ORDER — ATORVASTATIN CALCIUM 10 MG PO TABS: 10.0000 mg | ORAL_TABLET | Freq: Every day | ORAL | 0 refills | Status: DC

## 2023-08-27 MED ORDER — ALLOPURINOL 100 MG PO TABS: 100.0000 mg | ORAL_TABLET | Freq: Every day | ORAL | 0 refills | Status: DC

## 2023-08-27 NOTE — Addendum Note (Signed)
Addended by: Leonie Douglas on: 08/27/2023 03:12 PM   Modules accepted: Orders

## 2023-08-27 NOTE — Telephone Encounter (Signed)
-----   Message from Hoyt Koch sent at 08/22/2023  4:26 PM EDT ----- Per Dr. Corliss Skains, please schedule for consult visit.   Thanks,  Lannette Donath

## 2023-08-27 NOTE — Telephone Encounter (Signed)
Patient was seen on 9/23 with Dr. Milinda Cave. Prescriptions were sent to [facility] - which is incorrect pharmacy.  All prescriptions need to be sent to [facility]. Patient is almost out of the 2 below. Cannot wait 2-3 day turn around time for refills, as this should have been completed at appt 9/23.  atorvastatin (LIPITOR) 10 MG tablet  allopurinol (ZYLOPRIM) 100 MG tablet   All others please send to [facility], but put on file until ready for fill.

## 2023-08-27 NOTE — Telephone Encounter (Signed)
Atorvastatin and Allopurinol sent to [facility]. All others sent to [facility]

## 2023-09-01 ENCOUNTER — Ambulatory Visit (HOSPITAL_COMMUNITY): Payer: Medicare Other

## 2023-09-08 ENCOUNTER — Ambulatory Visit (HOSPITAL_COMMUNITY)
Admission: RE | Admit: 2023-09-08 | Discharge: 2023-09-08 | Disposition: A | Discharge status: Home / Self Care | Payer: Medicare Other | Source: Ambulatory Visit | Attending: Interventional Radiology | Admitting: Interventional Radiology

## 2023-09-08 DIAGNOSIS — I771 Stricture of artery: Secondary | ICD-10-CM

## 2023-09-09 ENCOUNTER — Other Ambulatory Visit: Payer: Self-pay | Admitting: Family Medicine

## 2023-09-15 HISTORY — PX: IR RADIOLOGIST EVAL & MGMT: IMG5224

## 2023-11-07 ENCOUNTER — Encounter: Payer: Self-pay | Admitting: Gastroenterology

## 2023-11-07 ENCOUNTER — Other Ambulatory Visit (INDEPENDENT_AMBULATORY_CARE_PROVIDER_SITE_OTHER): Payer: Medicare Other

## 2023-11-07 ENCOUNTER — Ambulatory Visit (INDEPENDENT_AMBULATORY_CARE_PROVIDER_SITE_OTHER): Payer: Medicare Other | Admitting: Gastroenterology

## 2023-11-07 ENCOUNTER — Ambulatory Visit: Payer: Medicare Other | Admitting: Gastroenterology

## 2023-11-07 VITALS — BP 130/70 | HR 67 | Ht 72.0 in

## 2023-11-07 DIAGNOSIS — K7460 Unspecified cirrhosis of liver: Secondary | ICD-10-CM

## 2023-11-07 DIAGNOSIS — H9190 Unspecified hearing loss, unspecified ear: Secondary | ICD-10-CM

## 2023-11-07 DIAGNOSIS — Z87898 Personal history of other specified conditions: Secondary | ICD-10-CM

## 2023-11-07 DIAGNOSIS — E119 Type 2 diabetes mellitus without complications: Secondary | ICD-10-CM

## 2023-11-07 DIAGNOSIS — Z794 Long term (current) use of insulin: Secondary | ICD-10-CM

## 2023-11-07 LAB — BASIC METABOLIC PANEL WITH GFR
BUN: 42 mg/dL — ABNORMAL HIGH (ref 6–23)
CO2: 27 meq/L (ref 19–32)
Calcium: 9.5 mg/dL (ref 8.4–10.5)
Chloride: 109 meq/L (ref 96–112)
Creatinine, Ser: 2.11 mg/dL — ABNORMAL HIGH (ref 0.40–1.50)
GFR: 28.25 mL/min — ABNORMAL LOW (ref >60.00)
Glucose, Bld: 158 mg/dL — ABNORMAL HIGH (ref 70–99)
Potassium: 4.4 meq/L (ref 3.5–5.1)
Sodium: 143 meq/L (ref 135–145)

## 2023-11-07 LAB — CBC WITH DIFFERENTIAL/PLATELET
Basophils Absolute: 0 10*3/uL (ref 0.0–0.1)
Basophils Relative: 0.7 % (ref 0.0–3.0)
Eosinophils Absolute: 0.1 10*3/uL (ref 0.0–0.7)
Eosinophils Relative: 3.2 % (ref 0.0–5.0)
HCT: 33.8 % — ABNORMAL LOW (ref 39.0–52.0)
Hemoglobin: 11.3 g/dL — ABNORMAL LOW (ref 13.0–17.0)
Lymphocytes Relative: 19.4 % (ref 12.0–46.0)
Lymphs Abs: 0.6 10*3/uL — ABNORMAL LOW (ref 0.7–4.0)
MCHC: 33.3 g/dL (ref 30.0–36.0)
MCV: 98.9 fL (ref 78.0–100.0)
Monocytes Absolute: 0.5 10*3/uL (ref 0.1–1.0)
Monocytes Relative: 18 % — ABNORMAL HIGH (ref 3.0–12.0)
Neutro Abs: 1.7 10*3/uL (ref 1.4–7.7)
Neutrophils Relative %: 58.7 % (ref 43.0–77.0)
Platelets: 123 10*3/uL — ABNORMAL LOW (ref 150.0–400.0)
RBC: 3.42 Mil/uL — ABNORMAL LOW (ref 4.22–5.81)
RDW: 16.6 % — ABNORMAL HIGH (ref 11.5–15.5)
WBC: 2.9 10*3/uL — ABNORMAL LOW (ref 4.0–10.5)

## 2023-11-07 LAB — HEPATIC FUNCTION PANEL
ALT: 33 U/L (ref 0–53)
AST: 55 U/L — ABNORMAL HIGH (ref 0–37)
Albumin: 3.9 g/dL (ref 3.5–5.2)
Alkaline Phosphatase: 55 U/L (ref 39–117)
Bilirubin, Direct: 0.3 mg/dL (ref 0.0–0.3)
Total Bilirubin: 0.9 mg/dL (ref 0.2–1.2)
Total Protein: 6.2 g/dL (ref 6.0–8.3)

## 2023-11-07 LAB — PROTIME-INR
INR: 1.2 {ratio} — ABNORMAL HIGH (ref 0.8–1.0)
Prothrombin Time: 13.1 s (ref 9.6–13.1)
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
## 2023-11-07 NOTE — Patient Instructions (Signed)
You have been scheduled for an abdominal ultrasound with elastography at [facility]  Radiology (1st floor). Your appointment is scheduled for 11/17/23 at 9am. Please arrive 30 minutes prior to your scheduled appointment for registration purposes. Make certain not to have anything to eat or drink 6 hours prior to your procedure. Should you need to reschedule your appointment, you may contact radiology at [phone].  Liver Elastography Various chronic liver diseases such as hepatitis B, C, and fatty liver disease can lead to tissue damage and subsequent scar tissue formation. As the scar tissue accumulates, the liver loses some of its elasticity and becomes stiffer. Liver elastography involves the use of a surface ultrasound probe that delivers a low frequency pulse or shear wave to a small volume of liver tissue under the rib cage. The transmission of the sound wave is completely painless. How Is a Liver Elastography Performed? The liver is located in the right upper abdomen under the rib cage. Patients are asked to lie flat on an examination table. A technician places the FibroScan probe between the ribs on the right side of the lower chest wall. A series of 10 painless pulses are then applied to the liver. The results are recorded on the equipment and an overall liver stiffness score is generated. This score is then interpreted by a qualified physician to predict the likelihood of advanced fibrosis or cirrhosis.  Patients are asked to wear loose clothing and should not consume any liquids or solids for a minimum of 4 hours before the test to increase the likelihood of obtaining reliable test results. The scan will take 10 to 15 minutes to complete, but patients should plan on being available for 30 minutes to allow time for preparation  Your provider has requested that you go to the basement level for lab work before leaving today. Press "B" on the elevator. The lab is located at the first door on the  left as you exit the elevator.   _______________________________________________________  If your blood pressure at your visit was 140/90 or greater, please contact your primary care physician to follow up on this.  _______________________________________________________  If you are age 68 or older, your body mass index should be between 23-30. Your Body mass index is 27.12 kg/m. If this is out of the aforementioned range listed, please consider follow up with your Primary Care Provider.  If you are age 40 or younger, your body mass index should be between 19-25. Your Body mass index is 27.12 kg/m. If this is out of the aformentioned range listed, please consider follow up with your Primary Care Provider.   ________________________________________________________  The [facility] providers would like to encourage you to use [facility] to communicate with providers for non-urgent requests or questions.  Due to long hold times on the telephone, sending your provider a message by [facility] may be a faster and more efficient way to get a response.  Please allow 48 business hours for a response.  Please remember that this is for non-urgent requests.   It was a pleasure to see you today!  Thank you for trusting me with your gastrointestinal care!    Scott E.Tomasa Rand, MD

## 2023-11-10 ENCOUNTER — Ambulatory Visit: Payer: Medicare Other | Admitting: Family Medicine

## 2023-11-11 ENCOUNTER — Ambulatory Visit (HOSPITAL_COMMUNITY): Payer: Medicare Other
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
## 2023-11-14 ENCOUNTER — Encounter: Payer: Self-pay | Admitting: Family Medicine

## 2023-11-14 ENCOUNTER — Ambulatory Visit (INDEPENDENT_AMBULATORY_CARE_PROVIDER_SITE_OTHER): Payer: Medicare Other | Admitting: Family Medicine

## 2023-11-14 VITALS — BP 122/69 | HR 66 | Temp 97.7°F | Wt 196.8 lb

## 2023-11-14 DIAGNOSIS — K7469 Other cirrhosis of liver: Secondary | ICD-10-CM | POA: Diagnosis not present

## 2023-11-14 DIAGNOSIS — R600 Localized edema: Secondary | ICD-10-CM

## 2023-11-14 DIAGNOSIS — Z794 Long term (current) use of insulin: Secondary | ICD-10-CM

## 2023-11-14 DIAGNOSIS — E1121 Type 2 diabetes mellitus with diabetic nephropathy: Secondary | ICD-10-CM | POA: Diagnosis not present

## 2023-11-14 LAB — POCT GLYCOSYLATED HEMOGLOBIN (HGB A1C)
HbA1c POC (<> result, manual entry): 5.9 % (ref 4.0–5.6)
HbA1c, POC (controlled diabetic range): 5.9 % (ref 0.0–7.0)
HbA1c, POC (prediabetic range): 5.9 % (ref 5.7–6.4)
Hemoglobin A1C: 5.9 % — AB (ref 4.0–5.6)

## 2023-11-14 LAB — MITOCHONDRIAL ANTIBODIES: Mitochondrial M2 Ab, IgG: <=20 U (ref <=20.0)

## 2023-11-14 LAB — IGG: IgG (Immunoglobin G), Serum: 982 mg/dL (ref 600–1540)

## 2023-11-14 LAB — ANTI-SMOOTH MUSCLE ANTIBODY, IGG: Actin (Smooth Muscle) Antibody (IGG): <20 U (ref <20)

## 2023-11-14 MED ORDER — FUROSEMIDE 20 MG PO TABS: ORAL_TABLET | ORAL | 3 refills | Status: DC

## 2023-11-14 MED ORDER — ALLOPURINOL 100 MG PO TABS: 100.0000 mg | ORAL_TABLET | Freq: Every day | ORAL | 3 refills | Status: DC

## 2023-11-15 ENCOUNTER — Other Ambulatory Visit: Payer: Self-pay | Admitting: Family Medicine

## 2023-11-17 ENCOUNTER — Ambulatory Visit (HOSPITAL_COMMUNITY)
Admission: RE | Admit: 2023-11-17 | Discharge: 2023-11-17 | Disposition: A | Discharge status: Home / Self Care | Payer: Medicare Other | Source: Ambulatory Visit | Attending: Gastroenterology | Admitting: Gastroenterology

## 2023-11-17 ENCOUNTER — Other Ambulatory Visit: Payer: Self-pay | Admitting: Gastroenterology

## 2023-11-17 DIAGNOSIS — K7460 Unspecified cirrhosis of liver: Secondary | ICD-10-CM | POA: Insufficient documentation

## 2023-11-17 DIAGNOSIS — E119 Type 2 diabetes mellitus without complications: Secondary | ICD-10-CM

## 2023-11-17 DIAGNOSIS — H9190 Unspecified hearing loss, unspecified ear: Secondary | ICD-10-CM

## 2023-11-17 DIAGNOSIS — Z87898 Personal history of other specified conditions: Secondary | ICD-10-CM

## 2023-11-26 ENCOUNTER — Other Ambulatory Visit: Payer: Self-pay | Admitting: Family Medicine

## 2023-12-01 ENCOUNTER — Other Ambulatory Visit: Payer: Self-pay | Admitting: Family Medicine
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
## 2023-12-08 ENCOUNTER — Ambulatory Visit (INDEPENDENT_AMBULATORY_CARE_PROVIDER_SITE_OTHER): Payer: Medicare Other | Admitting: Neurology

## 2023-12-08 ENCOUNTER — Encounter: Payer: Self-pay | Admitting: Neurology

## 2023-12-08 VITALS — BP 130/70 | HR 65 | Ht 72.0 in | Wt 205.0 lb

## 2023-12-08 DIAGNOSIS — R29898 Other symptoms and signs involving the musculoskeletal system: Secondary | ICD-10-CM | POA: Diagnosis not present

## 2023-12-08 DIAGNOSIS — I6502 Occlusion and stenosis of left vertebral artery: Secondary | ICD-10-CM

## 2023-12-08 NOTE — Patient Instructions (Signed)
Follow up one year

## 2023-12-10 ENCOUNTER — Encounter: Payer: Self-pay | Admitting: Gastroenterology

## 2023-12-10 NOTE — Progress Notes (Signed)
[name], Your ultrasound showed a cirrhotic appearing liver and the presence of trace ascites (fluid in the abdominal cavity).  Because of the presence of fluid, the elastography portion of your exam was not performed. We will need to repeat an ultrasound in 6 months to screen for liver cancer.  I do not think it is necessary to screen for esophageal varices, or to put you on empiric medications for that at this time. Please follow a low-sodium diet to help limit fluid accumulation in your abdomen.  [name], Can you please place an order for right upper quadrant ultrasound, CBC, CMP, AFP, INR in 6 months?

## 2023-12-11 ENCOUNTER — Other Ambulatory Visit: Payer: Self-pay

## 2023-12-11 DIAGNOSIS — K7460 Unspecified cirrhosis of liver: Secondary | ICD-10-CM

## 2023-12-24 ENCOUNTER — Other Ambulatory Visit: Payer: Self-pay | Admitting: Family Medicine

## 2023-12-26 ENCOUNTER — Other Ambulatory Visit: Payer: Self-pay | Admitting: Family Medicine

## 2023-12-29 MED ORDER — ATORVASTATIN CALCIUM 10 MG PO TABS: 10.0000 mg | ORAL_TABLET | Freq: Every day | ORAL | 1 refills | Status: DC

## 2023-12-29 MED ORDER — TAMSULOSIN HCL 0.4 MG PO CAPS: 0.8000 mg | ORAL_CAPSULE | Freq: Every day | ORAL | 1 refills | Status: DC

## 2023-12-29 MED ORDER — FENOFIBRATE 54 MG PO TABS: 108.0000 mg | ORAL_TABLET | Freq: Every day | ORAL | 1 refills | Status: DC

## 2023-12-29 MED ORDER — FINASTERIDE 5 MG PO TABS: 5.0000 mg | ORAL_TABLET | Freq: Every day | ORAL | 1 refills | Status: DC

## 2023-12-31 ENCOUNTER — Other Ambulatory Visit: Payer: Self-pay | Admitting: Student

## 2023-12-31 DIAGNOSIS — I771 Stricture of artery: Secondary | ICD-10-CM

## 2023-12-31 MED ORDER — CLOPIDOGREL BISULFATE 75 MG PO TABS: 75.0000 mg | ORAL_TABLET | Freq: Every day | ORAL | 3 refills | Status: DC

## 2023-12-31 NOTE — Telephone Encounter (Signed)
Interventional Radiology Brief Note:  Refill of Plavix 75mg  daily sent to [facility] as requested as patient is to continue on DAPT per Dr. Corliss Skains consultation 08/2023.   Loyce Dys, MS RD PA-C

## 2024-01-02 LAB — HM DIABETES EYE EXAM

## 2024-02-09 ENCOUNTER — Ambulatory Visit (INDEPENDENT_AMBULATORY_CARE_PROVIDER_SITE_OTHER): Admitting: Physician Assistant

## 2024-02-09 DIAGNOSIS — M1711 Unilateral primary osteoarthritis, right knee: Secondary | ICD-10-CM | POA: Diagnosis not present

## 2024-02-09 MED ORDER — METHYLPREDNISOLONE ACETATE 40 MG/ML IJ SUSP
40.0000 mg | INTRAMUSCULAR | Status: AC | PRN
  Administered 2024-02-09: 40 mg via INTRA_ARTICULAR

## 2024-02-09 MED ORDER — LIDOCAINE HCL 1 % IJ SOLN
3.0000 mL | INTRAMUSCULAR | Status: AC | PRN
  Administered 2024-02-09: 3 mL

## 2024-02-09 NOTE — Progress Notes (Addendum)
   Procedure Note  Patient: Dave Wright             Date of Birth: [birth year]           MRN: [id number]             Visit Date: 02/09/2024 HPI: Dave Wright returns today requesting a cortisone injection in his right knee.  He is mainly having medial knee pain.  Uses a cane to ambulate.  States last injection was helpful until just recently.  He is diabetic hemoglobin A1c last was 5.9.  Denies any fevers or chills.  Review of systems: See HPI otherwise  Physical exam: General Well-developed well-nourished male who ambulates with a cane.  No acute distress Bilateral knees: Good range of motion both knees.  Right knee no abnormal pain.  No gross instability right knee tenderness medial joint line right knee Procedures: Visit Diagnoses:  1. Primary osteoarthritis of right knee     Large Joint Inj: R knee on 02/09/2024 11:00 AM Indications: pain Details: 22 G 1.5 in needle, anterolateral approach  Arthrogram: No  Medications: 3 mL lidocaine  1 %; 40 mg methylPREDNISolone  acetate 40 MG/ML Outcome: tolerated well, no immediate complications Procedure, treatment alternatives, risks and benefits explained, specific risks discussed. Consent was given by the patient. Immediately prior to procedure a time out was called to verify the correct patient, procedure, equipment, support staff and site/side marked as required. Patient was prepped and draped in the usual sterile fashion.       Plan: He will follow-up with us  on an as-needed basis.  He knows to wait at least 3 months between cortisone injections.

## 2024-02-12 ENCOUNTER — Ambulatory Visit: Payer: Medicare Other | Admitting: Family Medicine

## 2024-03-08 ENCOUNTER — Encounter: Payer: Self-pay | Admitting: Family Medicine

## 2024-03-08 ENCOUNTER — Ambulatory Visit (INDEPENDENT_AMBULATORY_CARE_PROVIDER_SITE_OTHER): Admitting: Family Medicine

## 2024-03-08 VITALS — BP 114/59 | HR 69 | Wt 188.0 lb

## 2024-03-08 DIAGNOSIS — N1832 Chronic kidney disease, stage 3b: Secondary | ICD-10-CM

## 2024-03-08 DIAGNOSIS — E7800 Pure hypercholesterolemia, unspecified: Secondary | ICD-10-CM

## 2024-03-08 DIAGNOSIS — D61818 Other pancytopenia: Secondary | ICD-10-CM | POA: Diagnosis not present

## 2024-03-08 DIAGNOSIS — E1121 Type 2 diabetes mellitus with diabetic nephropathy: Secondary | ICD-10-CM

## 2024-03-08 DIAGNOSIS — Z794 Long term (current) use of insulin: Secondary | ICD-10-CM

## 2024-03-08 DIAGNOSIS — R600 Localized edema: Secondary | ICD-10-CM

## 2024-03-08 LAB — CBC
HCT: 34.1 % — ABNORMAL LOW (ref 39.0–52.0)
Hemoglobin: 11.4 g/dL — ABNORMAL LOW (ref 13.0–17.0)
MCHC: 33.5 g/dL (ref 30.0–36.0)
MCV: 97.8 fl (ref 78.0–100.0)
Platelets: 108 10*3/uL — ABNORMAL LOW (ref 150.0–400.0)
RBC: 3.48 Mil/uL — ABNORMAL LOW (ref 4.22–5.81)
RDW: 16.8 % — ABNORMAL HIGH (ref 11.5–15.5)
WBC: 2.4 10*3/uL — ABNORMAL LOW (ref 4.0–10.5)

## 2024-03-08 LAB — POCT GLYCOSYLATED HEMOGLOBIN (HGB A1C)
HbA1c POC (<> result, manual entry): 6.3 % (ref 4.0–5.6)
HbA1c, POC (controlled diabetic range): 6.3 % (ref 0.0–7.0)
HbA1c, POC (prediabetic range): 6.3 % (ref 5.7–6.4)
Hemoglobin A1C: 6.3 % — AB (ref 4.0–5.6)

## 2024-03-08 LAB — COMPREHENSIVE METABOLIC PANEL WITH GFR
ALT: 27 U/L (ref 0–53)
AST: 45 U/L — ABNORMAL HIGH (ref 0–37)
Albumin: 3.8 g/dL (ref 3.5–5.2)
Alkaline Phosphatase: 38 U/L — ABNORMAL LOW (ref 39–117)
BUN: 48 mg/dL — ABNORMAL HIGH (ref 6–23)
CO2: 26 meq/L (ref 19–32)
Calcium: 9.4 mg/dL (ref 8.4–10.5)
Chloride: 108 meq/L (ref 96–112)
Creatinine, Ser: 2.25 mg/dL — ABNORMAL HIGH (ref 0.40–1.50)
GFR: 26.09 mL/min — ABNORMAL LOW (ref >60.00)
Glucose, Bld: 111 mg/dL — ABNORMAL HIGH (ref 70–99)
Potassium: 4.4 meq/L (ref 3.5–5.1)
Sodium: 142 meq/L (ref 135–145)
Total Bilirubin: 0.9 mg/dL (ref 0.2–1.2)
Total Protein: 5.9 g/dL — ABNORMAL LOW (ref 6.0–8.3)

## 2024-03-08 LAB — LIPID PANEL
Cholesterol: 86 mg/dL (ref 0–200)
HDL: 50.2 mg/dL (ref >39.00)
LDL Cholesterol: 28 mg/dL (ref 0–99)
NonHDL: 36.14
Total CHOL/HDL Ratio: 2
Triglycerides: 43 mg/dL (ref 0.0–149.0)
VLDL: 8.6 mg/dL (ref 0.0–40.0)

## 2024-03-08 MED ORDER — FUROSEMIDE 20 MG PO TABS: ORAL_TABLET | ORAL | Status: DC
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
## 2024-03-09 ENCOUNTER — Encounter: Payer: Self-pay | Admitting: Family Medicine

## 2024-05-12 ENCOUNTER — Ambulatory Visit (INDEPENDENT_AMBULATORY_CARE_PROVIDER_SITE_OTHER): Admitting: Physician Assistant

## 2024-05-12 ENCOUNTER — Encounter: Payer: Self-pay | Admitting: Physician Assistant

## 2024-05-12 DIAGNOSIS — M1711 Unilateral primary osteoarthritis, right knee: Secondary | ICD-10-CM

## 2024-05-12 MED ORDER — METHYLPREDNISOLONE ACETATE 40 MG/ML IJ SUSP
40.0000 mg | INTRAMUSCULAR | Status: AC | PRN
  Administered 2024-05-12: 40 mg via INTRA_ARTICULAR

## 2024-05-12 MED ORDER — LIDOCAINE HCL 1 % IJ SOLN
3.0000 mL | INTRAMUSCULAR | Status: AC | PRN
  Administered 2024-05-12: 3 mL

## 2024-05-12 NOTE — Progress Notes (Signed)
   Procedure Note  Patient: CROY DRUMWRIGHT             Date of Birth: 11-24-1940           MRN: [id number]             Visit Date: 05/12/2024 HPI: Mr. Monnier comes in today with right knee pain.  Last seen 02/09/2024.  At that time was given right knee cortisone injection which helped.  His pain is increased over the last month.  He is using a cane to ambulate.  Long distances he uses rolling walker.  Reports his glucose levels are under good control.  He has had no injury to the right knee.  Denies any fevers chills.  Physical exam: Right knee good range of motion.  No abnormal warmth erythema or effusion.  Procedures: Visit Diagnoses:  1. Primary osteoarthritis of right knee     Large Joint Inj: R knee on 05/12/2024 11:09 AM Indications: pain Details: 22 G 1.5 in needle, anterolateral approach  Arthrogram: No  Medications: 3 mL lidocaine  1 %; 40 mg methylPREDNISolone  acetate 40 MG/ML Outcome: tolerated well, no immediate complications Procedure, treatment alternatives, risks and benefits explained, specific risks discussed. Consent was given by the patient. Immediately prior to procedure a time out was called to verify the correct patient, procedure, equipment, support staff and site/side marked as required. Patient was prepped and draped in the usual sterile fashion.     Plan: He needs to wait at least 3 months between cortisone injections.  Outside of knee replacement we could try viscosupplementation injection.  He will let us  know if he would like to retry this in the future.  Questions were encouraged and answered at length

## 2024-05-13 ENCOUNTER — Ambulatory Visit: Admitting: Gastroenterology

## 2024-05-24 LAB — BASIC METABOLIC PANEL WITH GFR
BUN: 41 — AB (ref 4–21)
CO2: 21 (ref 13–22)
Chloride: 108 (ref 99–108)
Creatinine: 2 — AB (ref 0.6–1.3)
Glucose: 132
Potassium: 5 meq/L (ref 3.5–5.1)
Sodium: 141 (ref 137–147)

## 2024-05-24 LAB — CBC AND DIFFERENTIAL
HCT: 30 — AB (ref 41–53)
Hemoglobin: 9.6 — AB (ref 13.5–17.5)
Neutrophils Absolute: 1.6
Platelets: 110 K/uL — AB (ref 150–400)
WBC: 2.6

## 2024-05-24 LAB — COMPREHENSIVE METABOLIC PANEL WITH GFR
Albumin: 3.8 (ref 3.5–5.0)
Calcium: 9.5 (ref 8.7–10.7)
eGFR: 33

## 2024-05-24 LAB — CBC: RBC: 2.98 — AB (ref 3.87–5.11)

## 2024-05-24 LAB — IRON,TIBC AND FERRITIN PANEL
%SAT: 22
EGFR: 33
Ferritin: 144
Iron: 70
TIBC: 318
UIBC: 248

## 2024-05-24 LAB — VITAMIN D 25 HYDROXY (VIT D DEFICIENCY, FRACTURES): Vit D, 25-Hydroxy: 39.1

## 2024-06-07 ENCOUNTER — Ambulatory Visit (INDEPENDENT_AMBULATORY_CARE_PROVIDER_SITE_OTHER): Admitting: Family Medicine

## 2024-06-07 ENCOUNTER — Encounter: Payer: Self-pay | Admitting: Family Medicine

## 2024-06-07 VITALS — BP 119/63 | HR 69 | Temp 97.8°F | Wt 191.0 lb

## 2024-06-07 DIAGNOSIS — E1121 Type 2 diabetes mellitus with diabetic nephropathy: Secondary | ICD-10-CM | POA: Diagnosis not present

## 2024-06-07 DIAGNOSIS — N184 Chronic kidney disease, stage 4 (severe): Secondary | ICD-10-CM

## 2024-06-07 DIAGNOSIS — K760 Fatty (change of) liver, not elsewhere classified: Secondary | ICD-10-CM | POA: Diagnosis not present

## 2024-06-07 DIAGNOSIS — R600 Localized edema: Secondary | ICD-10-CM | POA: Diagnosis not present

## 2024-06-07 DIAGNOSIS — Z794 Long term (current) use of insulin: Secondary | ICD-10-CM

## 2024-06-07 DIAGNOSIS — D631 Anemia in chronic kidney disease: Secondary | ICD-10-CM

## 2024-06-07 LAB — POCT GLYCOSYLATED HEMOGLOBIN (HGB A1C)
HbA1c POC (<> result, manual entry): 5.5 % (ref 4.0–5.6)
HbA1c, POC (controlled diabetic range): 5.5 % (ref 0.0–7.0)
HbA1c, POC (prediabetic range): 5.5 % — AB (ref 5.7–6.4)
Hemoglobin A1C: 5.5 % (ref 4.0–5.6)

## 2024-06-07 LAB — MICROALBUMIN / CREATININE URINE RATIO
Creatinine,U: 47.1 mg/dL
Microalb Creat Ratio: UNDETERMINED mg/g (ref 0.0–30.0)
Microalb, Ur: <0.7 mg/dL

## 2024-06-07 MED ORDER — ATORVASTATIN CALCIUM 10 MG PO TABS: 10.0000 mg | ORAL_TABLET | Freq: Every day | ORAL | 1 refills | Status: DC

## 2024-06-07 MED ORDER — FINASTERIDE 5 MG PO TABS: 5.0000 mg | ORAL_TABLET | Freq: Every day | ORAL | 1 refills | Status: DC

## 2024-06-07 MED ORDER — FENOFIBRATE 54 MG PO TABS: 108.0000 mg | ORAL_TABLET | Freq: Every day | ORAL | 1 refills | Status: DC

## 2024-06-07 MED ORDER — TAMSULOSIN HCL 0.4 MG PO CAPS: 0.8000 mg | ORAL_CAPSULE | Freq: Every day | ORAL | 1 refills | Status: DC
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
## 2024-06-15 ENCOUNTER — Emergency Department (HOSPITAL_COMMUNITY)

## 2024-06-15 ENCOUNTER — Inpatient Hospital Stay (HOSPITAL_COMMUNITY)
Admission: EM | Admit: 2024-06-15 | Discharge: 2024-07-19 | DRG: 518 | Disposition: E | Attending: Surgery | Admitting: Surgery

## 2024-06-15 ENCOUNTER — Other Ambulatory Visit: Payer: Self-pay

## 2024-06-15 ENCOUNTER — Encounter (HOSPITAL_COMMUNITY): Payer: Self-pay

## 2024-06-15 ENCOUNTER — Inpatient Hospital Stay (HOSPITAL_COMMUNITY)

## 2024-06-15 DIAGNOSIS — J9602 Acute respiratory failure with hypercapnia: Secondary | ICD-10-CM | POA: Diagnosis present

## 2024-06-15 DIAGNOSIS — Z66 Do not resuscitate: Secondary | ICD-10-CM | POA: Diagnosis not present

## 2024-06-15 DIAGNOSIS — Z87442 Personal history of urinary calculi: Secondary | ICD-10-CM

## 2024-06-15 DIAGNOSIS — S12400A Unspecified displaced fracture of fifth cervical vertebra, initial encounter for closed fracture: Secondary | ICD-10-CM | POA: Diagnosis present

## 2024-06-15 DIAGNOSIS — S12500A Unspecified displaced fracture of sixth cervical vertebra, initial encounter for closed fracture: Secondary | ICD-10-CM | POA: Diagnosis present

## 2024-06-15 DIAGNOSIS — Z833 Family history of diabetes mellitus: Secondary | ICD-10-CM

## 2024-06-15 DIAGNOSIS — J189 Pneumonia, unspecified organism: Secondary | ICD-10-CM | POA: Diagnosis not present

## 2024-06-15 DIAGNOSIS — E1122 Type 2 diabetes mellitus with diabetic chronic kidney disease: Secondary | ICD-10-CM | POA: Diagnosis present

## 2024-06-15 DIAGNOSIS — J9601 Acute respiratory failure with hypoxia: Secondary | ICD-10-CM | POA: Diagnosis present

## 2024-06-15 DIAGNOSIS — A419 Sepsis, unspecified organism: Secondary | ICD-10-CM | POA: Diagnosis not present

## 2024-06-15 DIAGNOSIS — T4275XA Adverse effect of unspecified antiepileptic and sedative-hypnotic drugs, initial encounter: Secondary | ICD-10-CM | POA: Diagnosis present

## 2024-06-15 DIAGNOSIS — R338 Other retention of urine: Secondary | ICD-10-CM | POA: Diagnosis present

## 2024-06-15 DIAGNOSIS — E782 Mixed hyperlipidemia: Secondary | ICD-10-CM | POA: Diagnosis present

## 2024-06-15 DIAGNOSIS — Z823 Family history of stroke: Secondary | ICD-10-CM

## 2024-06-15 DIAGNOSIS — M19012 Primary osteoarthritis, left shoulder: Secondary | ICD-10-CM | POA: Diagnosis present

## 2024-06-15 DIAGNOSIS — S0101XA Laceration without foreign body of scalp, initial encounter: Secondary | ICD-10-CM | POA: Diagnosis present

## 2024-06-15 DIAGNOSIS — D62 Acute posthemorrhagic anemia: Secondary | ICD-10-CM | POA: Diagnosis not present

## 2024-06-15 DIAGNOSIS — E119 Type 2 diabetes mellitus without complications: Secondary | ICD-10-CM | POA: Diagnosis not present

## 2024-06-15 DIAGNOSIS — N1832 Chronic kidney disease, stage 3b: Secondary | ICD-10-CM | POA: Diagnosis present

## 2024-06-15 DIAGNOSIS — Z515 Encounter for palliative care: Secondary | ICD-10-CM | POA: Diagnosis not present

## 2024-06-15 DIAGNOSIS — W010XXA Fall on same level from slipping, tripping and stumbling without subsequent striking against object, initial encounter: Secondary | ICD-10-CM | POA: Diagnosis present

## 2024-06-15 DIAGNOSIS — Z7902 Long term (current) use of antithrombotics/antiplatelets: Secondary | ICD-10-CM

## 2024-06-15 DIAGNOSIS — I480 Paroxysmal atrial fibrillation: Secondary | ICD-10-CM | POA: Diagnosis present

## 2024-06-15 DIAGNOSIS — S22079A Unspecified fracture of T9-T10 vertebra, initial encounter for closed fracture: Principal | ICD-10-CM | POA: Diagnosis present

## 2024-06-15 DIAGNOSIS — E440 Moderate protein-calorie malnutrition: Secondary | ICD-10-CM | POA: Diagnosis present

## 2024-06-15 DIAGNOSIS — Y92015 Private garage of single-family (private) house as the place of occurrence of the external cause: Secondary | ICD-10-CM | POA: Diagnosis not present

## 2024-06-15 DIAGNOSIS — Z87891 Personal history of nicotine dependence: Secondary | ICD-10-CM

## 2024-06-15 DIAGNOSIS — J690 Pneumonitis due to inhalation of food and vomit: Secondary | ICD-10-CM | POA: Diagnosis not present

## 2024-06-15 DIAGNOSIS — I10 Essential (primary) hypertension: Secondary | ICD-10-CM | POA: Diagnosis not present

## 2024-06-15 DIAGNOSIS — G9340 Encephalopathy, unspecified: Secondary | ICD-10-CM | POA: Diagnosis not present

## 2024-06-15 DIAGNOSIS — Z880 Allergy status to penicillin: Secondary | ICD-10-CM

## 2024-06-15 DIAGNOSIS — Z8261 Family history of arthritis: Secondary | ICD-10-CM

## 2024-06-15 DIAGNOSIS — S12600A Unspecified displaced fracture of seventh cervical vertebra, initial encounter for closed fracture: Secondary | ICD-10-CM | POA: Diagnosis present

## 2024-06-15 DIAGNOSIS — R1310 Dysphagia, unspecified: Secondary | ICD-10-CM | POA: Diagnosis present

## 2024-06-15 DIAGNOSIS — M109 Gout, unspecified: Secondary | ICD-10-CM | POA: Diagnosis present

## 2024-06-15 DIAGNOSIS — Z86008 Personal history of in-situ neoplasm of other site: Secondary | ICD-10-CM

## 2024-06-15 DIAGNOSIS — S129XXA Fracture of neck, unspecified, initial encounter: Principal | ICD-10-CM

## 2024-06-15 DIAGNOSIS — R9431 Abnormal electrocardiogram [ECG] [EKG]: Secondary | ICD-10-CM

## 2024-06-15 DIAGNOSIS — R471 Dysarthria and anarthria: Secondary | ICD-10-CM | POA: Diagnosis not present

## 2024-06-15 DIAGNOSIS — Z79899 Other long term (current) drug therapy: Secondary | ICD-10-CM

## 2024-06-15 DIAGNOSIS — N179 Acute kidney failure, unspecified: Secondary | ICD-10-CM | POA: Diagnosis not present

## 2024-06-15 DIAGNOSIS — E8770 Fluid overload, unspecified: Secondary | ICD-10-CM | POA: Diagnosis present

## 2024-06-15 DIAGNOSIS — K7460 Unspecified cirrhosis of liver: Secondary | ICD-10-CM | POA: Diagnosis present

## 2024-06-15 DIAGNOSIS — E870 Hyperosmolality and hypernatremia: Secondary | ICD-10-CM | POA: Diagnosis present

## 2024-06-15 DIAGNOSIS — D6959 Other secondary thrombocytopenia: Secondary | ICD-10-CM | POA: Diagnosis present

## 2024-06-15 DIAGNOSIS — S2242XA Multiple fractures of ribs, left side, initial encounter for closed fracture: Secondary | ICD-10-CM | POA: Diagnosis present

## 2024-06-15 DIAGNOSIS — S270XXA Traumatic pneumothorax, initial encounter: Secondary | ICD-10-CM | POA: Diagnosis present

## 2024-06-15 DIAGNOSIS — S22080A Wedge compression fracture of T11-T12 vertebra, initial encounter for closed fracture: Secondary | ICD-10-CM | POA: Diagnosis not present

## 2024-06-15 DIAGNOSIS — R4702 Dysphasia: Secondary | ICD-10-CM | POA: Diagnosis present

## 2024-06-15 DIAGNOSIS — Z8249 Family history of ischemic heart disease and other diseases of the circulatory system: Secondary | ICD-10-CM

## 2024-06-15 DIAGNOSIS — N401 Enlarged prostate with lower urinary tract symptoms: Secondary | ICD-10-CM | POA: Diagnosis present

## 2024-06-15 DIAGNOSIS — I4891 Unspecified atrial fibrillation: Secondary | ICD-10-CM | POA: Diagnosis not present

## 2024-06-15 DIAGNOSIS — Z6828 Body mass index (BMI) 28.0-28.9, adult: Secondary | ICD-10-CM

## 2024-06-15 DIAGNOSIS — D638 Anemia in other chronic diseases classified elsewhere: Secondary | ICD-10-CM | POA: Diagnosis present

## 2024-06-15 DIAGNOSIS — N170 Acute kidney failure with tubular necrosis: Secondary | ICD-10-CM | POA: Diagnosis present

## 2024-06-15 DIAGNOSIS — R739 Hyperglycemia, unspecified: Secondary | ICD-10-CM | POA: Diagnosis not present

## 2024-06-15 DIAGNOSIS — E875 Hyperkalemia: Secondary | ICD-10-CM | POA: Diagnosis present

## 2024-06-15 DIAGNOSIS — Z794 Long term (current) use of insulin: Secondary | ICD-10-CM

## 2024-06-15 DIAGNOSIS — S22089A Unspecified fracture of T11-T12 vertebra, initial encounter for closed fracture: Secondary | ICD-10-CM | POA: Diagnosis present

## 2024-06-15 DIAGNOSIS — E876 Hypokalemia: Secondary | ICD-10-CM | POA: Diagnosis not present

## 2024-06-15 DIAGNOSIS — I129 Hypertensive chronic kidney disease with stage 1 through stage 4 chronic kidney disease, or unspecified chronic kidney disease: Secondary | ICD-10-CM | POA: Diagnosis present

## 2024-06-15 DIAGNOSIS — I952 Hypotension due to drugs: Secondary | ICD-10-CM | POA: Diagnosis present

## 2024-06-15 DIAGNOSIS — E860 Dehydration: Secondary | ICD-10-CM | POA: Diagnosis present

## 2024-06-15 DIAGNOSIS — S22009A Unspecified fracture of unspecified thoracic vertebra, initial encounter for closed fracture: Secondary | ICD-10-CM | POA: Diagnosis present

## 2024-06-15 DIAGNOSIS — R6521 Severe sepsis with septic shock: Secondary | ICD-10-CM | POA: Diagnosis not present

## 2024-06-15 DIAGNOSIS — G9341 Metabolic encephalopathy: Secondary | ICD-10-CM | POA: Diagnosis present

## 2024-06-15 DIAGNOSIS — E1165 Type 2 diabetes mellitus with hyperglycemia: Secondary | ICD-10-CM | POA: Diagnosis present

## 2024-06-15 DIAGNOSIS — E874 Mixed disorder of acid-base balance: Secondary | ICD-10-CM | POA: Diagnosis present

## 2024-06-15 DIAGNOSIS — Z96611 Presence of right artificial shoulder joint: Secondary | ICD-10-CM | POA: Diagnosis present

## 2024-06-15 DIAGNOSIS — Z8673 Personal history of transient ischemic attack (TIA), and cerebral infarction without residual deficits: Secondary | ICD-10-CM

## 2024-06-15 DIAGNOSIS — Z91041 Radiographic dye allergy status: Secondary | ICD-10-CM

## 2024-06-15 DIAGNOSIS — D6489 Other specified anemias: Secondary | ICD-10-CM | POA: Diagnosis present

## 2024-06-15 DIAGNOSIS — K760 Fatty (change of) liver, not elsewhere classified: Secondary | ICD-10-CM | POA: Diagnosis present

## 2024-06-15 DIAGNOSIS — Z7982 Long term (current) use of aspirin: Secondary | ICD-10-CM

## 2024-06-15 DIAGNOSIS — R54 Age-related physical debility: Secondary | ICD-10-CM | POA: Diagnosis present

## 2024-06-15 LAB — CBC
HCT: 29.2 % — ABNORMAL LOW (ref 39.0–52.0)
Hemoglobin: 9.5 g/dL — ABNORMAL LOW (ref 13.0–17.0)
MCH: 32.5 pg (ref 26.0–34.0)
MCHC: 32.5 g/dL (ref 30.0–36.0)
MCV: 100 fL (ref 80.0–100.0)
Platelets: 117 K/uL — ABNORMAL LOW (ref 150–400)
RBC: 2.92 MIL/uL — ABNORMAL LOW (ref 4.22–5.81)
RDW: 15.6 % — ABNORMAL HIGH (ref 11.5–15.5)
WBC: 3.5 K/uL — ABNORMAL LOW (ref 4.0–10.5)
nRBC: 0 % (ref 0.0–0.2)

## 2024-06-15 LAB — COMPREHENSIVE METABOLIC PANEL WITH GFR
ALT: 33 U/L (ref 0–44)
AST: 70 U/L — ABNORMAL HIGH (ref 15–41)
Albumin: 3.1 g/dL — ABNORMAL LOW (ref 3.5–5.0)
Alkaline Phosphatase: 41 U/L (ref 38–126)
Anion gap: 6 (ref 5–15)
BUN: 39 mg/dL — ABNORMAL HIGH (ref 8–23)
CO2: 21 mmol/L — ABNORMAL LOW (ref 22–32)
Calcium: 9 mg/dL (ref 8.9–10.3)
Chloride: 109 mmol/L (ref 98–111)
Creatinine, Ser: 2 mg/dL — ABNORMAL HIGH (ref 0.61–1.24)
GFR, Estimated: 32 mL/min — ABNORMAL LOW (ref >60)
Glucose, Bld: 186 mg/dL — ABNORMAL HIGH (ref 70–99)
Potassium: 5.7 mmol/L — ABNORMAL HIGH (ref 3.5–5.1)
Sodium: 136 mmol/L (ref 135–145)
Total Bilirubin: 1.1 mg/dL (ref 0.0–1.2)
Total Protein: 5.1 g/dL — ABNORMAL LOW (ref 6.5–8.1)

## 2024-06-15 LAB — SAMPLE TO BLOOD BANK

## 2024-06-15 MED ORDER — HYDRALAZINE HCL 20 MG/ML IJ SOLN
10.0000 mg | INTRAMUSCULAR | Status: DC | PRN
  Administered 2024-06-16: 10 mg via INTRAVENOUS
  Filled 2024-06-15: qty 1

## 2024-06-15 MED ORDER — OXYCODONE HCL 5 MG PO TABS
2.5000 mg | ORAL_TABLET | ORAL | Status: DC | PRN
  Administered 2024-06-16: 5 mg via ORAL
  Filled 2024-06-15: qty 1

## 2024-06-15 MED ORDER — MORPHINE SULFATE (PF) 2 MG/ML IV SOLN
1.0000 mg | INTRAVENOUS | Status: DC | PRN
  Administered 2024-06-16 – 2024-06-17 (×2): 1 mg via INTRAVENOUS
  Administered 2024-06-18: 2 mg via INTRAVENOUS
  Administered 2024-06-18: 1 mg via INTRAVENOUS
  Administered 2024-06-24 – 2024-06-25 (×4): 2 mg via INTRAVENOUS
  Filled 2024-06-15 (×10): qty 1

## 2024-06-15 MED ORDER — ONDANSETRON 4 MG PO TBDP: 4.0000 mg | ORAL_TABLET | Freq: Four times a day (QID) | ORAL | Status: DC | PRN

## 2024-06-15 MED ORDER — METHOCARBAMOL 500 MG PO TABS
1000.0000 mg | ORAL_TABLET | Freq: Three times a day (TID) | ORAL | Status: DC
  Administered 2024-06-15: 1000 mg via ORAL
  Filled 2024-06-15 (×2): qty 2

## 2024-06-15 MED ORDER — GADOBUTROL 1 MMOL/ML IV SOLN
9.0000 mL | Freq: Once | INTRAVENOUS | Status: AC | PRN
  Administered 2024-06-15: 9 mL via INTRAVENOUS

## 2024-06-15 MED ORDER — SODIUM ZIRCONIUM CYCLOSILICATE 10 G PO PACK
10.0000 g | PACK | Freq: Two times a day (BID) | ORAL | Status: DC
  Administered 2024-06-15: 10 g via ORAL
  Filled 2024-06-15: qty 1

## 2024-06-15 MED ORDER — DOCUSATE SODIUM 100 MG PO CAPS
100.0000 mg | ORAL_CAPSULE | Freq: Two times a day (BID) | ORAL | Status: DC
  Administered 2024-06-15: 100 mg via ORAL
  Filled 2024-06-15: qty 1

## 2024-06-15 MED ORDER — ENOXAPARIN SODIUM 30 MG/0.3ML IJ SOSY
30.0000 mg | PREFILLED_SYRINGE | Freq: Two times a day (BID) | INTRAMUSCULAR | Status: DC
  Administered 2024-06-17 – 2024-06-21 (×10): 30 mg via SUBCUTANEOUS
  Filled 2024-06-15 (×11): qty 0.3

## 2024-06-15 MED ORDER — POLYETHYLENE GLYCOL 3350 17 G PO PACK
17.0000 g | PACK | Freq: Every day | ORAL | Status: DC | PRN
Start: 2024-06-15 — End: 2024-06-16

## 2024-06-15 MED ORDER — ACETAMINOPHEN 500 MG PO TABS
1000.0000 mg | ORAL_TABLET | Freq: Four times a day (QID) | ORAL | Status: DC
  Administered 2024-06-15: 1000 mg via ORAL
  Filled 2024-06-15 (×2): qty 2

## 2024-06-15 MED ORDER — ONDANSETRON HCL 4 MG/2ML IJ SOLN: 4.0000 mg | Freq: Four times a day (QID) | INTRAMUSCULAR | Status: DC | PRN

## 2024-06-15 MED ORDER — METOPROLOL TARTRATE 5 MG/5ML IV SOLN
5.0000 mg | Freq: Four times a day (QID) | INTRAVENOUS | Status: DC | PRN
  Administered 2024-06-17: 5 mg via INTRAVENOUS
  Filled 2024-06-15: qty 5

## 2024-06-15 MED ORDER — LACTATED RINGERS IV SOLN: INTRAVENOUS | Status: DC

## 2024-06-15 MED ORDER — FENTANYL CITRATE PF 50 MCG/ML IJ SOSY
50.0000 ug | PREFILLED_SYRINGE | Freq: Once | INTRAMUSCULAR | Status: AC
  Administered 2024-06-15: 50 ug via INTRAVENOUS
  Filled 2024-06-15: qty 1
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
## 2024-06-15 NOTE — Progress Notes (Signed)
   06/15/24 1602  Spiritual Encounters  Type of Visit Initial  Care provided to: Patient  Reason for visit Trauma  OnCall Visit No  Spiritual Framework  Patient Stress Factors None identified  Family Stress Factors None identified  Interventions  Spiritual Care Interventions Made Compassionate presence;Established relationship of care and support   Chaplain responded to ED page for Pt.in room ED Room 27. Upon arrival , nurse informed chaplain that Pt.was en route to CT Scan. No spiritual care was requested or needed at this time.
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
## 2024-06-15 NOTE — Progress Notes (Addendum)
 Pt presented after a fall in his garage, falling backwards onto concrete floor from second step. PMH of HTN, DM, CKD.  Currently neurologically intact per EDP.  CT C spine concerning for C5-6, 6-7 fracture with potential ligamentous involvement. CT CAP concerning for T11-12 unstable injury.  MRI C/T spine pending.  Continue C collar, log roll precautions/bedrest.   Andri Prestia CAYLIN Arietta Eisenstein, PA-C

## 2024-06-15 NOTE — ED Triage Notes (Signed)
 Patient BIB EMS from home after a fall while he is taking blood thinners. Patient was on the second step in the garage when he fell back and hit his head on the concrete. Patient does have an abrasion to the back of the head. Patient denies LOC. Patient c/o neck, back, and left forearm pain.

## 2024-06-15 NOTE — ED Provider Notes (Addendum)
 [place] EMERGENCY DEPARTMENT AT [facility] Provider Note   CSN: [id number] Arrival date & time: 06/15/24  1543     Patient presents with: Dave Wright is a 84 y.o. male.   HPI    83 year old patient with history of diabetes, hyperlipidemia on Plavix  comes in with chief complaint of mechanical fall.  Patient brought in by EMS.  Per EMS, patient was going up his garage, he lost his balance and fell from the second step.  Patient fell backwards onto concrete surface and could not get up.  Patient currently complaining of pain in his head, neck and also lower back.  He denies any loss of consciousness.   Prior to Admission medications   Medication Sig Start Date End Date Taking? Authorizing Provider  acetaminophen  (TYLENOL ) 325 MG tablet Take 650 mg by mouth every 6 (six) hours as needed for moderate pain (pain score 4-6).    [provider]  allopurinol  (ZYLOPRIM ) 100 MG tablet Take 1 tablet (100 mg total) by mouth daily. 11/14/23   McGowen, Aleene DEL, MD  aspirin  EC 81 MG tablet Take 81 mg by mouth daily. Swallow whole.    [provider]  atorvastatin  (LIPITOR) 10 MG tablet Take 1 tablet (10 mg total) by mouth daily. 06/07/24   McGowen, Aleene DEL, MD  carboxymethylcellulose (REFRESH PLUS) 0.5 % SOLN Place 1 drop into both eyes 3 (three) times daily as needed (dry eyes).    [provider]  cholecalciferol (VITAMIN D3) 25 MCG (1000 UNIT) tablet Take 2 tablets by mouth daily.    [provider]  clopidogrel  (PLAVIX ) 75 MG tablet Take 1 tablet (75 mg total) by mouth daily. 12/31/23 12/25/24  Matthews, Kacie Sue-Ellen, PA  fenofibrate  54 MG tablet Take 2 tablets (108 mg total) by mouth daily. 06/07/24   McGowen, Aleene DEL, MD  finasteride  (PROSCAR ) 5 MG tablet Take 1 tablet (5 mg total) by mouth daily. 06/07/24   McGowen, Aleene DEL, MD  furosemide  (LASIX ) 20 MG tablet 1 tab po every other day 03/08/24   McGowen, Aleene DEL, MD  LANTUS  100  UNIT/ML injection INJECT SUBCUTANEOUSLY 34 UNITS  ONCE DAILY 11/17/23   McGowen, Aleene DEL, MD  magnesium  oxide (MAG-OX) 400 MG tablet Take 800 mg by mouth at bedtime.    [provider]  tamsulosin  (FLOMAX ) 0.4 MG CAPS capsule Take 2 capsules (0.8 mg total) by mouth daily. 06/07/24   McGowen, Aleene DEL, MD    Allergies: Ivp dye [iodinated contrast media], Penicillins, Tramadol, and Ferrous sulfate    Review of Systems  All other systems reviewed and are negative.   Updated Vital Signs BP (!) 160/86 (BP Location: Right Arm)   Pulse (!) 114   Temp 98.1 F (36.7 C) (Oral)   Resp (!) 24   Ht 6' (1.829 m)   Wt 86.6 kg   SpO2 99%   BMI 25.89 kg/m   Physical Exam Vitals and nursing note reviewed.  Constitutional:      Appearance: He is well-developed.  HENT:     Head: Atraumatic.  Cardiovascular:     Rate and Rhythm: Normal rate.  Pulmonary:     Effort: Pulmonary effort is normal.  Musculoskeletal:     Cervical back: Neck supple.  Skin:    General: Skin is warm.  Neurological:     Mental Status: He is alert and oriented to person, place, and time.     (all labs ordered are listed, but  only abnormal results are displayed) Labs Reviewed  COMPREHENSIVE METABOLIC PANEL WITH GFR - Abnormal; Notable for the following components:      Result Value   Potassium 5.7 (*)    CO2 21 (*)    Glucose, Bld 186 (*)    BUN 39 (*)    Creatinine, Ser 2.00 (*)    Total Protein 5.1 (*)    Albumin 3.1 (*)    AST 70 (*)    GFR, Estimated 32 (*)    All other components within normal limits  CBC - Abnormal; Notable for the following components:   WBC 3.5 (*)    RBC 2.92 (*)    Hemoglobin 9.5 (*)    HCT 29.2 (*)    RDW 15.6 (*)    Platelets 117 (*)    All other components within normal limits  CBC  BASIC METABOLIC PANEL WITH GFR  SAMPLE TO BLOOD BANK    EKG: None  Radiology: CT CHEST ABDOMEN PELVIS WO CONTRAST Addendum Date: 06/15/2024 ADDENDUM REPORT: 06/15/2024 20:47  ADDENDUM: There is an error in the above report. The fractured vertebral body is T11 and the disruption of the anterior longitudinal ligament with widening of the disc space is at T10-11. No other changes are made to this report. These results were called by telephone at the time of interpretation on 06/15/2024 at 8:46 pm to provider Charlyn, MD, who verbally acknowledged these results. Electronically Signed   By: Dorethia Molt M.D.   On: 06/15/2024 20:47   Result Date: 06/15/2024 CLINICAL DATA:  Blunt polytrauma EXAM: CT CHEST, ABDOMEN AND PELVIS WITHOUT CONTRAST TECHNIQUE: Multidetector CT imaging of the chest, abdomen and pelvis was performed following the standard protocol without IV contrast. RADIATION DOSE REDUCTION: This exam was performed according to the departmental dose-optimization program which includes automated exposure control, adjustment of the mA and/or kV according to patient size and/or use of iterative reconstruction technique. COMPARISON:  None Available. FINDINGS: CT CHEST FINDINGS Cardiovascular: Moderate left anterior descending coronary artery calcification. Global cardiac size within limits. No pericardial effusion. Central pulmonary arteries are of normal caliber. There is mild diffuse form dilation of the ascending aorta measuring 4.1 by cm in maximal diameter. Descending thoracic aorta is of normal caliber. Moderate atherosclerotic calcification within thoracic aorta. Mediastinum/Nodes: 2.2 cm left thyroid  nodule, better assessed on prior thyroid  sonogram of 05/16/2023 and biopsied on 07/28/2023. No follow-up imaging is recommended. No pathologic thoracic adenopathy. Esophagus is unremarkable. Lungs/Pleura: Small left hemo pneumothorax is present with a small amount of hemorrhagic fluid and a small anterior gaseous component. No CT evidence of tension physiology. No pneumothorax or pleural effusion on the right. Bibasilar dependent atelectasis. Debris within the trachea and right  mainstem bronchus noted. Musculoskeletal: There are acute, minimally displaced fractures of the left 7-9 ribs posteriorly. There is a vertical cleft fracture of the T12 vertebral body (70/6) as well as destruction of the anterior tongue tube ligament and widening of the T11-12 disc space seen anteriorly in keeping with a hyperextension injury. The posterior wall of the T12 vertebral body appears intact, as are the posterior elements at T11-12. No listhesis or facetal dislocation. Multiple healed right rib fractures are noted. Right total shoulder arthroplasty has been performed. CT ABDOMEN PELVIS FINDINGS Hepatobiliary: Cholelithiasis without superimposed evidence of acute cholecystitis. Mild perihepatic ascites. No definite parenchymal laceration identified on this noncontrast examination. No intra or extrahepatic biliary ductal dilation. Pancreas: Scattered punctate calcification within the pancreatic tail likely reflects changes of chronic pancreatitis in  this location. No superimposed acute peripancreatic inflammatory changes are identified. Spleen: There is trace perisplenic fluid identified (17/2) which is relatively low attenuation and likely represent simple ascites. No parenchymal lacerations clearly identified on this noncontrast examination. The spleen is in size. Adrenals/Urinary Tract: Adrenal glands are unremarkable. 8 cm simple cortical cyst is seen within the lower pole the left kidney for which no follow-up imaging is recommended. The kidneys are otherwise unremarkable. Bladder unremarkable. Stomach/Bowel: Stomach is within normal limits. Appendix appears normal. No evidence of bowel wall thickening, distention, or inflammatory changes. No free intraperitoneal gas. Vascular/Lymphatic: There is infiltration retroperitoneum surrounding the origins of the superior mesenteric artery and celiac axis. A vascular injury is not clearly identified, however, evaluation is limited by noncontrast technique.  Retroperitoneal edema, venous hemorrhage or retroperitoneal fibrosis could appear similarly. Reproductive: Prostate is unremarkable. Other: No abdominal wall hernia Musculoskeletal: No acute bone abnormality involving the abdomen and pelvis. IMPRESSION: 1. Small left hemopneumothorax. No CT evidence of tension physiology. 2. Acute, minimally displaced fractures of the left 7-9 ribs posteriorly. 3. Vertical cleft fracture of the T12 vertebral body with disruption of the anterior longitudinal ligament and widening of the T11-12 disc space in keeping with a hyperextension injury. The posterior wall of the T12 vertebral body appears intact, as are the posterior elements at T11-12. No listhesis or facetal dislocation. 4. Infiltration of the retroperitoneum surrounding the origins of the superior mesenteric artery and celiac axis. A vascular injury is not clearly identified, however, evaluation is limited by noncontrast technique. Retroperitoneal edema, venous hemorrhage or retroperitoneal fibrosis could appear similarly. If there is clinical concern for vascular injury, CT arteriography is recommended for further evaluation. 5. Mild ascites, nonspecific. 6. Cholelithiasis 7. Moderate coronary artery calcification. 8. 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 Recommend annual imaging followup by CTA or MRA. This recommendation follows 2010 ACCF/AHA/AATS/ACR/ASA/SCA/SCAI/SIR/STS/SVM Guidelines for the Diagnosis and Management of Patients with Thoracic Aortic Disease. Circulation. 2010; 121: Z733-z630. Aortic aneurysm NOS (ICD10-I71.9) Aortic Atherosclerosis (ICD10-I70.0). Electronically Signed: By: Dorethia Molt M.D. On: 06/15/2024 20:39   CT HEAD WO CONTRAST Result Date: 06/15/2024 CLINICAL DATA:  Provided history: Polytrauma, blunt. Head trauma, moderate/severe. EXAM: CT HEAD WITHOUT CONTRAST CT CERVICAL SPINE WITHOUT CONTRAST TECHNIQUE: Multidetector CT imaging of the head and cervical  spine was performed following the standard protocol without intravenous contrast. Multiplanar CT image reconstructions of the cervical spine were also generated. RADIATION DOSE REDUCTION: This exam was performed according to the departmental dose-optimization program which includes automated exposure control, adjustment of the mA and/or kV according to patient size and/or use of iterative reconstruction technique. COMPARISON:  CT angiogram head/neck 07/28/2023. Thyroid  ultrasound 05/16/2023. FINDINGS: CT HEAD FINDINGS Brain: Mild generalized cerebral atrophy. 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 Background mild patchy and ill-defined hypoattenuation within the cerebral white matter, nonspecific but compatible with chronic small vessel ischemic disease. Known small chronic infarcts within the left cerebellar hemisphere. There is no acute intracranial hemorrhage. No demarcated cortical infarct. No extra-axial fluid collection. No evidence of an intracranial mass. No midline shift. Vascular: No hyperdense vessel.  Atherosclerotic calcification. Skull: No calvarial fracture or aggressive osseous lesion. Sinuses/Orbits: No mass or acute finding within the imaged orbits. Partial opacification of a posterior right ethmoid air cell. Other: Redemonstrated chronic nasal bone fracture deformities. Midline parietal scalp hematoma. CT CERVICAL SPINE FINDINGS Alignment: No significant spondylolisthesis. Widening of the C6-C7 intervertebral disc space anteriorly (to 6 mm). Skull base and vertebrae: The basion-dental and atlanto-dental  intervals are maintained. Acute, displaced fracture through the anterosuperior corner of the C7 vertebral body (also involving an adjacent C6-C7 ventral osteophyte) (for instance as seen on series 7, image 31). Nondisplaced acute fracture through a C5-C6 ventral osteophyte to the right (for instance as seen on series 7, image 25). Soft tissues and spinal canal: No  prevertebral fluid or swelling. No visible canal hematoma. Known thyroid  nodules which were previously assessed with thyroid  ultrasound 05/16/2023. Additionally, one of the nodules within the left thyroid  lobe was previously biopsied. Please correlate with the findings on the prior thyroid  ultrasound of 05/16/2023 and with pathology from prior thyroid  nodule fine-needle aspiration. 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 Disc space narrowing is greatest at C5-C6 (moderate-to-advanced at this level). No appreciable high-grade spinal canal stenosis. Multilevel bony neural foraminal narrowing. Multilevel ventral osteophytes, most prominent at C5-C6 and C6-C7. Upper chest: Incompletely imaged left apical pneumothorax (series 4, image 94). Other: Multiple stents within the left vertebral artery. Cervical spine CT impressions #1, #2 and #4 called by telephone at the time of interpretation on 06/15/2024 at 4:48 pm to provider [facility] , who verbally acknowledged these results. IMPRESSION: CT head: 1.  No evidence of an acute intracranial abnormality. 2. Midline parietal scalp hematoma. 3. Parenchymal atrophy, chronic small vessel ischemic disease and unchanged chronic infarcts, as described. 4. Minor right ethmoid sinus disease. CT cervical spine: 1. Acute, displaced fracture through the anterosuperior corner of the C7 vertebral body (also involving an adjacent C6-C7 ventral osteophyte). There is widening of the C6-C7 intervertebral disc space anteriorly (to 6 mm). A cervical spine MRI is recommended to assess for ligamentous injury, and to assess for involvement of the C6-C7 disc space. 2. Acute nondisplaced fracture through a C5-C6 ventral osteophyte. 3. Cervical spondylosis as described. 4. Incompletely imaged left apical pneumothorax. Electronically Signed   By: Rockey Childs D.O.   On:  06/15/2024 16:56   CT CERVICAL SPINE WO CONTRAST Result Date: 06/15/2024 CLINICAL DATA:  Provided history: Polytrauma, blunt. Head trauma, moderate/severe. EXAM: CT HEAD WITHOUT CONTRAST CT CERVICAL SPINE WITHOUT CONTRAST TECHNIQUE: Multidetector CT imaging of the head and cervical spine was performed following the standard protocol without intravenous contrast. Multiplanar CT image reconstructions of the cervical spine were also generated. RADIATION DOSE REDUCTION: This exam was performed according to the departmental dose-optimization program which includes automated exposure control, adjustment of the mA and/or kV according to patient size and/or use of iterative reconstruction technique. COMPARISON:  CT angiogram head/neck 07/28/2023. Thyroid  ultrasound 05/16/2023. FINDINGS: CT HEAD FINDINGS Brain: Mild generalized cerebral atrophy. 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 Background mild patchy and ill-defined hypoattenuation within the cerebral white matter, nonspecific but compatible with chronic small vessel ischemic disease. Known small chronic infarcts within the left cerebellar hemisphere. There is no acute intracranial hemorrhage. No demarcated cortical infarct. No extra-axial fluid collection. No evidence of an intracranial mass. No midline shift. Vascular: No hyperdense vessel.  Atherosclerotic calcification. Skull: No calvarial fracture or aggressive osseous lesion. Sinuses/Orbits: No mass or acute finding within the imaged orbits. Partial opacification of a posterior right ethmoid air cell. Other: Redemonstrated chronic nasal bone fracture deformities. Midline parietal scalp hematoma. CT CERVICAL SPINE FINDINGS Alignment: No significant spondylolisthesis. Widening of the C6-C7 intervertebral disc space anteriorly (to 6 mm). Skull base and vertebrae: The basion-dental and atlanto-dental intervals are maintained. Acute, displaced fracture through the anterosuperior corner of  the C7 vertebral body (  also involving an adjacent C6-C7 ventral osteophyte) (for instance as seen on series 7, image 31). Nondisplaced acute fracture through a C5-C6 ventral osteophyte to the right (for instance as seen on series 7, image 25). Soft tissues and spinal canal: No prevertebral fluid or swelling. No visible canal hematoma. Known thyroid  nodules which were previously assessed with thyroid  ultrasound 05/16/2023. Additionally, one of the nodules within the left thyroid  lobe was previously biopsied. Please correlate with the findings on the prior thyroid  ultrasound of 05/16/2023 and with pathology from prior thyroid  nodule fine-needle aspiration. 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 Disc space narrowing is greatest at C5-C6 (moderate-to-advanced at this level). No appreciable high-grade spinal canal stenosis. Multilevel bony neural foraminal narrowing. Multilevel ventral osteophytes, most prominent at C5-C6 and C6-C7. Upper chest: Incompletely imaged left apical pneumothorax (series 4, image 94). Other: Multiple stents within the left vertebral artery. Cervical spine CT impressions #1, #2 and #4 called by telephone at the time of interpretation on 06/15/2024 at 4:48 pm to provider [facility] , who verbally acknowledged these results. IMPRESSION: CT head: 1.  No evidence of an acute intracranial abnormality. 2. Midline parietal scalp hematoma. 3. Parenchymal atrophy, chronic small vessel ischemic disease and unchanged chronic infarcts, as described. 4. Minor right ethmoid sinus disease. CT cervical spine: 1. Acute, displaced fracture through the anterosuperior corner of the C7 vertebral body (also involving an adjacent C6-C7 ventral osteophyte). There is widening of the C6-C7 intervertebral disc space anteriorly (to 6 mm). A cervical spine MRI is recommended to assess for  ligamentous injury, and to assess for involvement of the C6-C7 disc space. 2. Acute nondisplaced fracture through a C5-C6 ventral osteophyte. 3. Cervical spondylosis as described. 4. Incompletely imaged left apical pneumothorax. Electronically Signed   By: Rockey Childs D.O.   On: 06/15/2024 16:56   DG Lumbar Spine Complete Result Date: 06/15/2024 CLINICAL DATA:  Fall. EXAM: LUMBAR SPINE - COMPLETE 4+ VIEW COMPARISON:  Lumbar spine radiograph dated 07/29/2018. FINDINGS: Five lumbar type vertebra. There is no acute fracture or subluxation of the lumbar spine. Multilevel degenerative changes with disc space narrowing and spurring. Lower lumbar facet arthropathy. Mild atherosclerotic calcification of the abdominal aorta. There is a 1 cm radiopaque focus in the right flank which may represent a kidney stone. IMPRESSION: 1. No acute fracture or subluxation. 2. Multilevel degenerative changes. 3. Possible right kidney stone. Electronically Signed   By: Vanetta Chou M.D.   On: 06/15/2024 16:41   DG Chest Port 1 View Result Date: 06/15/2024 CLINICAL DATA:  Fall. EXAM: PORTABLE CHEST 1 VIEW COMPARISON:  August 19, 2022. FINDINGS: The heart size and mediastinal contours are within normal limits. Right lung is clear. Probable minimal left apical pneumothorax is noted. The visualized skeletal structures are unremarkable. IMPRESSION: Probable minimal left apical pneumothorax. CT scan may be performed for further evaluation. Electronically Signed   By: Lynwood Landy Raddle M.D.   On: 06/15/2024 16:01     .Critical Care  Performed by: Charlyn Sora, MD Authorized by: Charlyn Sora, MD   Critical care provider statement:    Critical care time (minutes):  41   Critical care time was exclusive of:  Separately billable procedures and treating other patients   Critical care was necessary to treat or prevent imminent or life-threatening deterioration of the following conditions:  Trauma   Critical care was time spent  personally by me on the following activities:  Development of  treatment plan with patient or surrogate, discussions with consultants, evaluation of patient's response to treatment, examination of patient, ordering and review of laboratory studies, ordering and review of radiographic studies, ordering and performing treatments and interventions, pulse oximetry, re-evaluation of patient's condition, review of old charts and obtaining history from patient or surrogate    Medications Ordered in the ED  acetaminophen  (TYLENOL ) tablet 1,000 mg (has no administration in time range)  oxyCODONE  (Oxy IR/ROXICODONE ) immediate release tablet 2.5-5 mg (has no administration in time range)  morphine  (PF) 2 MG/ML injection 1-2 mg (has no administration in time range)  docusate sodium  (COLACE) capsule 100 mg (has no administration in time range)  polyethylene glycol (MIRALAX  / GLYCOLAX ) packet 17 g (has no administration in time range)  ondansetron  (ZOFRAN -ODT) disintegrating tablet 4 mg (has no administration in time range)    Or  ondansetron  (ZOFRAN ) injection 4 mg (has no administration in time range)  metoprolol  tartrate (LOPRESSOR ) injection 5 mg (has no administration in time range)  hydrALAZINE  (APRESOLINE ) injection 10 mg (has no administration in time range)  enoxaparin  (LOVENOX ) injection 30 mg (has no administration in time range)  methocarbamol  (ROBAXIN ) tablet 1,000 mg (has no administration in time range)  lactated ringers  infusion (has no administration in time range)  sodium zirconium cyclosilicate  (LOKELMA ) packet 10 g (has no administration in time range)  fentaNYL  (SUBLIMAZE ) injection 50 mcg (50 mcg Intravenous Given 06/15/24 1959)                                    Medical Decision Making Amount and/or Complexity of Data Reviewed Labs: ordered. Radiology: ordered.  Risk Prescription drug management. Decision regarding hospitalization.   83 year old patient comes in after  sustaining what appears to be a mechanical fall. 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 Collateral history provided by EMS.  Patient currently complaining of back pain, neck pain, headache. Back pain appears to be lower back.  He is moving all 4 extremities, gross sensory exam is normal.  He denies loss of consciousness.  Based on my history and exam, differential diagnosis includes: - Traumatic brain injury including intracranial hemorrhage - Long bone fractures, rib fracture - Spine fracture - Contusions - Soft tissue injury - Concussion  Patient has stable pelvis, able to raise upper and lower extremity.  Based on the initial assessment, the following workup was initiated x-ray of the chest, CT head and C-spine.  I have independently interpreted the following imaging from the perspective of acute trauma: CT scan of the brain and the x-ray of the chest and the results indicate no clear evidence of pneumothorax, no brain bleed.  I received a call from radiologist thereafter.  Patient's CT scan is actually showing apical pneumothorax.  CT cervical spine reveals multiple spine fractures.  Results of the ED workup have been discussed with the patient and the family.  Plan is to get MRI of the cervical spine.  I have consulted neurosurgery.  They called me back immediately after the page.  They recommend getting MRI of the cervical spine.  I also consulted trauma surgery for admission.  Trauma team had ordered CT of the torso.  That CT scan is showing findings that are concerning for T-spine fracture.  MRI of the T-spine has been added.  Neurosurgery made aware of the thoracic spine injury.   Final diagnoses:  Closed fracture of cervical  vertebra, unspecified cervical vertebral level, initial encounter [facility])  Traumatic pneumothorax, initial encounter  Closed fracture of thoracic vertebra, unspecified fracture morphology, unspecified thoracic  vertebral level, initial encounter [facility])    ED Discharge Orders     None          Charlyn Sora, MD 06/15/24 2149    Charlyn Sora, MD 06/15/24 2236

## 2024-06-15 NOTE — Progress Notes (Signed)
 Orthopedic Tech Progress Note Patient Details:  [name] 1941/09/05 [id number]  Level 2 trauma   Patient ID: Dave Wright, male   DOB: 05-07-1941, 84 y.o.   MRN: [id number]  [name] Pac 06/15/2024, 5:00 PM

## 2024-06-16 ENCOUNTER — Inpatient Hospital Stay (HOSPITAL_COMMUNITY)

## 2024-06-16 ENCOUNTER — Other Ambulatory Visit: Payer: Self-pay | Admitting: Neurosurgery

## 2024-06-16 LAB — CBC
HCT: 28.3 % — ABNORMAL LOW (ref 39.0–52.0)
Hemoglobin: 9.1 g/dL — ABNORMAL LOW (ref 13.0–17.0)
MCH: 32.4 pg (ref 26.0–34.0)
MCHC: 32.2 g/dL (ref 30.0–36.0)
MCV: 100.7 fL — ABNORMAL HIGH (ref 80.0–100.0)
Platelets: 116 K/uL — ABNORMAL LOW (ref 150–400)
RBC: 2.81 MIL/uL — ABNORMAL LOW (ref 4.22–5.81)
RDW: 15.5 % (ref 11.5–15.5)
WBC: 6.8 K/uL (ref 4.0–10.5)
nRBC: 0 % (ref 0.0–0.2)

## 2024-06-16 LAB — BASIC METABOLIC PANEL WITH GFR
Anion gap: 6 (ref 5–15)
BUN: 38 mg/dL — ABNORMAL HIGH (ref 8–23)
CO2: 20 mmol/L — ABNORMAL LOW (ref 22–32)
Calcium: 9.1 mg/dL (ref 8.9–10.3)
Chloride: 111 mmol/L (ref 98–111)
Creatinine, Ser: 2.05 mg/dL — ABNORMAL HIGH (ref 0.61–1.24)
GFR, Estimated: 31 mL/min — ABNORMAL LOW (ref >60)
Glucose, Bld: 174 mg/dL — ABNORMAL HIGH (ref 70–99)
Potassium: 4.3 mmol/L (ref 3.5–5.1)
Sodium: 137 mmol/L (ref 135–145)

## 2024-06-16 LAB — GLUCOSE, CAPILLARY
Glucose-Capillary: 140 mg/dL — ABNORMAL HIGH (ref 70–99)
Glucose-Capillary: 129 mg/dL — ABNORMAL HIGH (ref 70–99)

## 2024-06-16 LAB — MAGNESIUM: Magnesium: 1.8 mg/dL (ref 1.7–2.4)

## 2024-06-16 LAB — PHOSPHORUS: Phosphorus: 3.7 mg/dL (ref 2.5–4.6)

## 2024-06-16 LAB — MRSA NEXT GEN BY PCR, NASAL: MRSA by PCR Next Gen: NOT DETECTED

## 2024-06-16 MED ORDER — METHOCARBAMOL 500 MG PO TABS: 1000.0000 mg | ORAL_TABLET | Freq: Three times a day (TID) | ORAL | Status: DC

## 2024-06-16 MED ORDER — FINASTERIDE 5 MG PO TABS
5.0000 mg | ORAL_TABLET | Freq: Every day | ORAL | Status: DC
  Administered 2024-06-16 – 2024-06-18 (×3): 5 mg via ORAL
  Filled 2024-06-16 (×3): qty 1

## 2024-06-16 MED ORDER — POLYETHYLENE GLYCOL 3350 17 G PO PACK: 17.0000 g | PACK | Freq: Every day | ORAL | Status: DC | PRN

## 2024-06-16 MED ORDER — ACETAMINOPHEN 500 MG PO TABS
1000.0000 mg | ORAL_TABLET | Freq: Four times a day (QID) | ORAL | Status: AC
  Administered 2024-06-16 (×2): 1000 mg via ORAL
  Filled 2024-06-16 (×2): qty 2

## 2024-06-16 MED ORDER — METHOCARBAMOL 500 MG PO TABS
1000.0000 mg | ORAL_TABLET | Freq: Three times a day (TID) | ORAL | Status: DC
  Filled 2024-06-16 (×2): qty 2

## 2024-06-16 MED ORDER — CHLORHEXIDINE GLUCONATE CLOTH 2 % EX PADS
6.0000 | MEDICATED_PAD | Freq: Every day | CUTANEOUS | Status: DC
  Administered 2024-06-16 – 2024-06-25 (×10): 6 via TOPICAL

## 2024-06-16 MED ORDER — OSMOLITE 1.2 CAL PO LIQD
1000.0000 mL | ORAL | Status: DC
  Filled 2024-06-16 (×2): qty 1000

## 2024-06-16 MED ORDER — FUROSEMIDE 20 MG PO TABS: 20.0000 mg | ORAL_TABLET | ORAL | Status: DC

## 2024-06-16 MED ORDER — OXYCODONE HCL 5 MG PO TABS
5.0000 mg | ORAL_TABLET | ORAL | Status: DC | PRN
  Administered 2024-06-18: 5 mg via ORAL
  Filled 2024-06-16: qty 1

## 2024-06-16 MED ORDER — TAMSULOSIN HCL 0.4 MG PO CAPS
0.4000 mg | ORAL_CAPSULE | Freq: Every day | ORAL | Status: DC
  Administered 2024-06-17 – 2024-06-18 (×2): 0.4 mg via ORAL
  Filled 2024-06-16 (×3): qty 1

## 2024-06-16 MED ORDER — METOPROLOL TARTRATE 25 MG/10 ML ORAL SUSPENSION
12.5000 mg | Freq: Two times a day (BID) | ORAL | Status: DC
  Filled 2024-06-16: qty 10

## 2024-06-16 MED ORDER — DOCUSATE SODIUM 50 MG/5ML PO LIQD: 100.0000 mg | Freq: Two times a day (BID) | ORAL | Status: DC

## 2024-06-16 MED ORDER — ORAL CARE MOUTH RINSE: 15.0000 mL | OROMUCOSAL | Status: DC | PRN

## 2024-06-16 MED ORDER — OXYCODONE HCL 5 MG PO TABS: 2.5000 mg | ORAL_TABLET | ORAL | Status: DC | PRN

## 2024-06-16 MED ORDER — INSULIN ASPART 100 UNIT/ML IJ SOLN
0.0000 [IU] | INTRAMUSCULAR | Status: DC
  Administered 2024-06-16 (×2): 1 [IU] via SUBCUTANEOUS
  Administered 2024-06-16: 5 [IU] via SUBCUTANEOUS
  Administered 2024-06-17 – 2024-06-18 (×8): 1 [IU] via SUBCUTANEOUS
  Administered 2024-06-18: 3 [IU] via SUBCUTANEOUS
  Administered 2024-06-18: 2 [IU] via SUBCUTANEOUS
  Administered 2024-06-18: 5 [IU] via SUBCUTANEOUS
  Administered 2024-06-19: 7 [IU] via SUBCUTANEOUS
  Administered 2024-06-19: 3 [IU] via SUBCUTANEOUS
  Administered 2024-06-19: 5 [IU] via SUBCUTANEOUS

## 2024-06-16 MED ORDER — OXYCODONE HCL 5 MG PO TABS
2.5000 mg | ORAL_TABLET | ORAL | Status: DC | PRN
  Administered 2024-06-16: 2.5 mg via ORAL
  Filled 2024-06-16: qty 1

## 2024-06-16 MED ORDER — ACETAMINOPHEN 10 MG/ML IV SOLN
1000.0000 mg | Freq: Four times a day (QID) | INTRAVENOUS | Status: DC
  Administered 2024-06-16: 1000 mg via INTRAVENOUS
  Filled 2024-06-16: qty 100

## 2024-06-16 MED ORDER — METHOCARBAMOL 1000 MG/10ML IJ SOLN
1000.0000 mg | Freq: Three times a day (TID) | INTRAMUSCULAR | Status: DC
  Administered 2024-06-17 – 2024-06-21 (×12): 1000 mg via INTRAVENOUS
  Filled 2024-06-16 (×13): qty 10

## 2024-06-16 MED ORDER — ALLOPURINOL 100 MG PO TABS
100.0000 mg | ORAL_TABLET | Freq: Every day | ORAL | Status: DC
  Administered 2024-06-17 – 2024-06-18 (×2): 100 mg via ORAL
  Filled 2024-06-16 (×3): qty 1

## 2024-06-16 MED ORDER — FUROSEMIDE 20 MG PO TABS
20.0000 mg | ORAL_TABLET | ORAL | Status: DC
Start: 2024-06-18 — End: 2024-06-18
  Administered 2024-06-18: 20 mg via ORAL
  Filled 2024-06-16: qty 1

## 2024-06-16 MED ORDER — PROSOURCE TF20 ENFIT COMPATIBL EN LIQD: 60.0000 mL | Freq: Every day | ENTERAL | Status: DC

## 2024-06-16 MED ORDER — METOPROLOL TARTRATE 12.5 MG HALF TABLET
12.5000 mg | ORAL_TABLET | Freq: Two times a day (BID) | ORAL | Status: DC
  Filled 2024-06-16 (×2): qty 1

## 2024-06-16 MED ORDER — POLYVINYL ALCOHOL 1.4 % OP SOLN
1.0000 [drp] | Freq: Three times a day (TID) | OPHTHALMIC | Status: DC | PRN
  Administered 2024-06-18 – 2024-06-23 (×4): 1 [drp] via OPHTHALMIC
  Filled 2024-06-16: qty 15

## 2024-06-16 MED ORDER — DOXAZOSIN MESYLATE 2 MG PO TABS
2.0000 mg | ORAL_TABLET | Freq: Every day | ORAL | Status: DC
  Filled 2024-06-16: qty 1

## 2024-06-16 MED ORDER — OXYCODONE HCL 5 MG PO TABS: 5.0000 mg | ORAL_TABLET | ORAL | Status: DC | PRN

## 2024-06-16 MED ORDER — ATORVASTATIN CALCIUM 10 MG PO TABS
10.0000 mg | ORAL_TABLET | Freq: Every day | ORAL | Status: DC
  Administered 2024-06-17 – 2024-06-18 (×2): 10 mg via ORAL
  Filled 2024-06-16 (×3): qty 1

## 2024-06-16 MED ORDER — DOCUSATE SODIUM 100 MG PO CAPS
100.0000 mg | ORAL_CAPSULE | Freq: Two times a day (BID) | ORAL | Status: DC
  Filled 2024-06-16 (×2): qty 1

## 2024-06-16 MED ORDER — ACETAMINOPHEN 160 MG/5ML PO SOLN
1000.0000 mg | Freq: Four times a day (QID) | ORAL | Status: DC
  Filled 2024-06-16: qty 40.6

## 2024-06-16 MED ORDER — METOPROLOL TARTRATE 25 MG/10 ML ORAL SUSPENSION: 12.5000 mg | Freq: Two times a day (BID) | ORAL | Status: DC

## 2024-06-16 NOTE — Progress Notes (Signed)
 Orthopedic Tech Progress Note Patient Details:  Dave Wright 04-30-1941 [id number]  Ortho Devices Type of Ortho Device: Thoracolumbar corset (TLSO) Ortho Device/Splint Location: BACK Ortho Device/Splint Interventions: Ordered, Application, Adjustment, Removal fitted TLSO BRACE with family at bedside    Post Interventions Patient Tolerated: Fair Instructions Provided: Care of device  [name] Pac 06/16/2024, 11:39 AM

## 2024-06-16 NOTE — Progress Notes (Signed)
 Speech Language Pathology Treatment: Dysphagia  Patient Details Name: NETTIE CROMWELL MRN: [id number] DOB: 01-28-1941 Today's Date: 06/16/2024 Time: 8575-8560 SLP Time Calculation (min) (ACUTE ONLY): 15 min  Assessment / Plan / Recommendation Clinical Impression  SLP called to reassess pt after PT donned TLSO brace and assisted him to sit upright in the chair. He reports improved secretion management. Pt continues to cough immediately after having sips of water, which is concerning for aspiration. He reports some difficulty swallowing PTA since buccal SCC but the symptoms that he and his wife report sound primarily oral. Oral transit and clearance appear WFL with trials of purees and solids. Feel that proceeding with an MBS may still be of benefit but until that is completed, he can have ice chips in moderation and priority meds crushed in puree as long as he is sitting upright. SLP will f/u to complete MBS as scheduling allows, tentatively next date.    HPI HPI: Pt is a 83 yo male s/p fall. CT(+) acute displaced C7 fx, non displaced C5-6, T10-11 vertical cleft fx with disc space involvement, Small L HPTX and L 7-9 rib fxs.  Planned thoracic fx sx next Tuesday. 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

## 2024-06-16 NOTE — Progress Notes (Signed)
 Physical Therapy Treatment Patient Details Name: Dave Wright MRN: [id number] DOB: 1941/06/13 Today's Date: 06/16/2024   History of Present Illness Pt is a 83 yo male s/p fall. CT(+) acute displaced C7 fx, non displaced C5-6, T10-11 vertical cleft fx with disc space involvement, Small L HPTX and L 7-9 rib fxs.  Planned thoracic fx sx next Tuesday. PMH arthritis, chroinc daily HA, chronic leukopenia,CVA, DM2, gout, hepatic cirrhosis, HTN, HLD, OA,squamous cell carcinoma, Back surg, R total shoulder sx.    PT Comments  Returned to assist pt back to bed from chair. Needs a little more assist to stand from chair as pt uses lift chair at home. Tolerated OOB and pt happy to have been able to get up. Will continue to work on mobility.     If plan is discharge home, recommend the following: A little help with walking and/or transfers;A little help with bathing/dressing/bathroom;Assist for transportation;Help with stairs or ramp for entrance   Can travel by private vehicle        Equipment Recommendations  None recommended by PT    Recommendations for Other Services       Precautions / Restrictions Precautions Precautions: Back Precaution Booklet Issued: No Recall of Precautions/Restrictions: Intact Precaution/Restrictions Comments: reviewed with pt Required Braces or Orthoses: Spinal Brace;Cervical Brace Cervical Brace: Hard collar;At all times Spinal Brace: Thoracolumbosacral orthotic;Applied in supine position (MD reports Supine don with pending surgery Tuesday 8/5. Per neurosurgery pt OOB with therapy only.) Restrictions Weight Bearing Restrictions Per Provider Order: No     Mobility  Bed Mobility Overal bed mobility: Needs Assistance Bed Mobility: Rolling, Sit to Sidelying Rolling: Mod assist, Used rails Sidelying to sit: Mod assist, +2 for safety/equipment     Sit to sidelying: Mod assist, +2 for physical assistance, Used rails General bed mobility comments: Assist to  bring legs up into bed and to monitor trunk descending to bed. Verbal cues for technique    Transfers Overall transfer level: Needs assistance Equipment used: Rolling walker (2 wheels) Transfers: Sit to/from Stand Sit to Stand: +2 physical assistance, Mod assist           General transfer comment: Assist to power up from low chair. Steps to chair with assist for balance and support.    Ambulation/Gait                   Stairs             Wheelchair Mobility     Tilt Bed    Modified Rankin (Stroke Patients Only)       Balance Overall balance assessment: Needs assistance Sitting-balance support: No upper extremity supported, Feet supported Sitting balance-Leahy Scale: Fair     Standing balance support: Bilateral upper extremity supported, During functional activity Standing balance-Leahy Scale: Poor Standing balance comment: relies on RW  and external support                            Communication Communication Communication: No apparent difficulties  Cognition Arousal: Alert Behavior During Therapy: WFL for tasks assessed/performed   PT - Cognitive impairments: No apparent impairments                         Following commands: Intact      Cueing Cueing Techniques: Verbal cues  Exercises      General Comments General comments (skin integrity, edema, etc.): VSS on RA  Pertinent Vitals/Pain Pain Assessment Pain Assessment: Faces Faces Pain Scale: Hurts little more Pain Location: generalized Pain Descriptors / Indicators: Discomfort, Sore Pain Intervention(s): Limited activity within patient's tolerance, Monitored during session, Repositioned    Home Living Family/patient expects to be discharged to:: Private residence Living Arrangements: Spouse/significant other Available Help at Discharge: Family;Available 24 hours/day Type of Home: House Home Access: Ramped entrance       Home Layout: One  level Home Equipment: Grab bars - tub/shower;Rollator (4 wheels);Cane - single point;Lift chair      Prior Function            PT Goals (current goals can now be found in the care plan section) Acute Rehab PT Goals Patient Stated Goal: return home PT Goal Formulation: With patient/family Time For Goal Achievement: 06/30/24 Potential to Achieve Goals: Good Progress towards PT goals: Progressing toward goals    Frequency    Min 3X/week      PT Plan      Co-evaluation PT/OT/SLP Co-Evaluation/Treatment: Yes Reason for Co-Treatment: For patient/therapist safety;To address functional/ADL transfers PT goals addressed during session: Mobility/safety with mobility;Balance;Proper use of DME OT goals addressed during session: ADL's and self-care      AM-PAC PT 6 Clicks Mobility   Outcome Measure  Help needed turning from your back to your side while in a flat bed without using bedrails?: A Lot Help needed moving from lying on your back to sitting on the side of a flat bed without using bedrails?: A Lot Help needed moving to and from a bed to a chair (including a wheelchair)?: Total Help needed standing up from a chair using your arms (e.g., wheelchair or bedside chair)?: Total Help needed to walk in hospital room?: Total Help needed climbing 3-5 steps with a railing? : Total 6 Click Score: 8    End of Session Equipment Utilized During Treatment: Gait belt;Back brace;Cervical collar Activity Tolerance: Patient tolerated treatment well Patient left: in bed;with call bell/phone within reach;with bed alarm set Nurse Communication: Mobility status;Precautions (Nurse assisted with mobility) PT Visit Diagnosis: Unsteadiness on feet (R26.81);Other abnormalities of gait and mobility (R26.89);Muscle weakness (generalized) (M62.81);History of falling (Z91.81)     Time: 1545-1601 PT Time Calculation (min) (ACUTE ONLY): 16 min  Charges:    $Therapeutic Activity: 8-22 mins PT  General Charges $$ ACUTE PT VISIT: 1 Visit                    [facility] PT Acute Rehabilitation Services Office [phone]    Rodgers ORN [facility] 06/16/2024, 5:14 PM

## 2024-06-16 NOTE — Evaluation (Addendum)
 Physical Therapy Evaluation Patient Details Name: Dave Wright MRN: [id number] DOB: November 09, 1941 Today's Date: 06/16/2024  History of Present Illness  Pt is a 83 yo male s/p fall. CT(+) acute displaced C7 fx, non displaced C5-6, T10-11 vertical cleft fx with disc space involvement, Small L HPTX and L 7-9 rib fxs.  Planned thoracic fx sx next Tuesday. PMH arthritis, chroinc daily HA, chronic leukopenia,CVA, DM2, gout, hepatic cirrhosis, HTN, HLD, OA,squamous cell carcinoma, Back surg, R total shoulder sx.  Clinical Impression  Pt admitted with above diagnosis and presents to PT with functional limitations due to deficits listed below (See PT problem list). Pt needs skilled PT to maximize independence and safety. Pt currently with spinal fx's being managed with braces with planned thoracic surgery next week. Pt tolerated OOB to chair well and very happy to be OOB. Expect pt will make steady progress and feel he will benefit from intensive inpatient follow-up therapy, >3 hours/day after we see how he does after surgery next week. Per neurosurgery pt is to don/doff brace supine and OOB with therapy only. Neurosurgery - can pt leave TLSO on during the day so he can keep HOB elevated throughout the day as he wishes as well as be able to eat/take meds etc without donning/doffing brace each time?           If plan is discharge home, recommend the following: A little help with walking and/or transfers;A little help with bathing/dressing/bathroom;Assist for transportation;Help with stairs or ramp for entrance   Can travel by private vehicle        Equipment Recommendations None recommended by PT  Recommendations for Other Services       Functional Status Assessment Patient has had a recent decline in their functional status and demonstrates the ability to make significant improvements in function in a reasonable and predictable amount of time.     Precautions / Restrictions  Precautions Precautions: Back Precaution Booklet Issued: No Recall of Precautions/Restrictions: Intact Precaution/Restrictions Comments: reviewed with pt Required Braces or Orthoses: Spinal Brace;Cervical Brace Cervical Brace: Hard collar;At all times Spinal Brace: Thoracolumbosacral orthotic;Applied in supine position (MD reports Supine don with pending surgery Tuesday 8/5. Per neurosurgery pt OOB with therapy only.) Restrictions Weight Bearing Restrictions Per Provider Order: No      Mobility  Bed Mobility Overal bed mobility: Needs Assistance Bed Mobility: Rolling, Sidelying to Sit Rolling: Mod assist, Used rails Sidelying to sit: Mod assist, +2 for safety/equipment       General bed mobility comments: light mod assist to roll for brace mgmt  and come to EOB, cueing for technique and safety    Transfers Overall transfer level: Needs assistance Equipment used: Rolling walker (2 wheels) Transfers: Sit to/from Stand Sit to Stand: Min assist, +2 physical assistance           General transfer comment: Assist to power up with pt pulling up on RW. Steps to chair with assist for balance and support.    Ambulation/Gait                  Stairs            Wheelchair Mobility     Tilt Bed    Modified Rankin (Stroke Patients Only)       Balance Overall balance assessment: Needs assistance Sitting-balance support: No upper extremity supported, Feet supported Sitting balance-Leahy Scale: Fair     Standing balance support: Bilateral upper extremity supported, During functional activity Standing balance-Leahy Scale: Poor Standing  balance comment: relies on RW  and external support                             Pertinent Vitals/Pain Pain Assessment Pain Assessment: Faces Faces Pain Scale: Hurts little more Pain Location: generalized Pain Descriptors / Indicators: Discomfort, Sore Pain Intervention(s): Limited activity within patient's  tolerance, Monitored during session, Repositioned    Home Living Family/patient expects to be discharged to:: Private residence Living Arrangements: Spouse/significant other Available Help at Discharge: Family;Available 24 hours/day Type of Home: House Home Access: Ramped entrance       Home Layout: One level Home Equipment: Grab bars - tub/shower;Rollator (4 wheels);Cane - single point;Lift chair      Prior Function Prior Level of Function : Independent/Modified Independent;History of Falls (last six months)             Mobility Comments: cane inside the house, cane or rollator in community/walking in morning, able to get up from higher surfaces. Assist to rise from lower surfaces ADLs Comments: ind ADLs; spouse drives     Extremity/Trunk Assessment   Upper Extremity Assessment Upper Extremity Assessment: Defer to OT evaluation    Lower Extremity Assessment Lower Extremity Assessment: Generalized weakness    Cervical / Trunk Assessment Cervical / Trunk Assessment: Other exceptions Cervical / Trunk Exceptions: cervical and thoracic fx, bracing  Communication   Communication Communication: No apparent difficulties    Cognition Arousal: Alert Behavior During Therapy: WFL for tasks assessed/performed   PT - Cognitive impairments: No apparent impairments                         Following commands: Intact       Cueing Cueing Techniques: Verbal cues     General Comments General comments (skin integrity, edema, etc.): VSS on RA    Exercises     Assessment/Plan    PT Assessment Patient needs continued PT services  PT Problem List Decreased strength;Decreased activity tolerance;Decreased balance;Decreased mobility       PT Treatment Interventions DME instruction;Gait training;Functional mobility training;Therapeutic activities;Therapeutic exercise;Balance training;Patient/family education    PT Goals (Current goals can be found in the Care Plan  section)  Acute Rehab PT Goals Patient Stated Goal: return home PT Goal Formulation: With patient/family Time For Goal Achievement: 06/30/24 Potential to Achieve Goals: Good    Frequency Min 3X/week     Co-evaluation PT/OT/SLP Co-Evaluation/Treatment: Yes Reason for Co-Treatment: For patient/therapist safety;To address functional/ADL transfers PT goals addressed during session: Mobility/safety with mobility;Balance;Proper use of DME OT goals addressed during session: ADL's and self-care       AM-PAC PT 6 Clicks Mobility  Outcome Measure Help needed turning from your back to your side while in a flat bed without using bedrails?: A Lot Help needed moving from lying on your back to sitting on the side of a flat bed without using bedrails?: A Lot Help needed moving to and from a bed to a chair (including a wheelchair)?: Total Help needed standing up from a chair using your arms (e.g., wheelchair or bedside chair)?: Total Help needed to walk in hospital room?: Total Help needed climbing 3-5 steps with a railing? : Total 6 Click Score: 8    End of Session Equipment Utilized During Treatment: Gait belt;Back brace;Cervical collar Activity Tolerance: Patient tolerated treatment well Patient left: in chair;with call bell/phone within reach;with chair alarm set Nurse Communication: Mobility status;Precautions PT Visit Diagnosis: Unsteadiness  on feet (R26.81);Other abnormalities of gait and mobility (R26.89);Muscle weakness (generalized) (M62.81);History of falling (Z91.81)    Time: 8798-8754 PT Time Calculation (min) (ACUTE ONLY): 44 min   Charges:   PT Evaluation $PT Eval Moderate Complexity: 1 Mod PT Treatments $Therapeutic Activity: 8-22 mins PT General Charges $$ ACUTE PT VISIT: 1 Visit         [facility] PT Acute Rehabilitation Services Office [phone]   Rodgers ORN [facility] 06/16/2024, 3:07 PM

## 2024-06-16 NOTE — Progress Notes (Signed)
 Progress Note     Subjective: Pt reports he is overall doing ok this AM. Was able to urinate some overnight and denies feeling the urge to urinate this AM. Denies abdominal pain, nausea or vomiting. Did not have a BM yesterday. Denies significant chest pain or SOB. RN notes patient has had some PACs and rate alternated between 60s and 120. Denies numbness or tingling in extremities. No family at bedside yet this AM. Noted to have difficulty swallowing overnight, denies hx of dysphagia at home.   Objective: Vital signs in last 24 hours: Temp:  [97.3 F (36.3 C)-98.2 F (36.8 C)] 97.9 F (36.6 C) (07/30 0337) Pulse Rate:  [74-118] 76 (07/30 0245) Resp:  [8-24] 17 (07/30 0700) BP: (127-182)/(53-95) 172/64 (07/30 0700) SpO2:  [94 %-100 %] 98 % (07/30 0700) Weight:  [86.6 kg-87.6 kg] 87.6 kg (07/30 0245)    Intake/Output from previous day: 07/29 0701 - 07/30 0700 In: 382.8 [I.V.:282.8; IV Piggyback:100] Out: 350 [Urine:350] Intake/Output this shift: No intake/output data recorded.  PE: General: pleasant, WD, WN male who is laying in bed in NAD Neck: cervical collar present  HEENT: small scalp laceration without active bleeding  Heart: regular, rate, and rhythm in the 60s for my exam.  Normal s1,s2. No obvious murmurs, gallops, or rubs noted.  Palpable radial and pedal pulses bilaterally Lungs: CTAB, no wheezes, rhonchi, or rales noted.  Respiratory effort nonlabored Abd: soft, NT, ND, +BS, no masses, hernias, or organomegaly MS: all 4 extremities are symmetrical with no cyanosis, clubbing, or edema. Skin: warm and dry with no masses, lesions, or rashes Neuro: Cranial nerves 2-12 grossly intact, sensation is normal throughout, grip strength 5/5 bilaterally, dorsiflexion of feet 5/5 bilaterally, plantarflexion of feet 5/5 bilaterally  Psych: A&Ox3 with an appropriate affect.    Lab Results:  Recent Labs    06/15/24 1559 06/16/24 0613  WBC 3.5* 6.8  HGB 9.5* 9.1*  HCT 29.2*  28.3*  PLT 117* 116*   BMET Recent Labs    06/15/24 1559 06/16/24 0613  NA 136 137  K 5.7* 4.3  CL 109 111  CO2 21* 20*  GLUCOSE 186* 174*  BUN 39* 38*  CREATININE 2.00* 2.05*  CALCIUM  9.0 9.1   PT/INR No results for input(s): LABPROT, INR in the last 72 hours. CMP     Component Value Date/Time   NA 137 06/16/2024 0613   NA 141 05/24/2024 0000   K 4.3 06/16/2024 0613   CL 111 06/16/2024 0613   CO2 20 (L) 06/16/2024 0613   GLUCOSE 174 (H) 06/16/2024 0613   BUN 38 (H) 06/16/2024 0613   BUN 41 (A) 05/24/2024 0000   CREATININE 2.05 (H) 06/16/2024 0613   CREATININE 2.14 (H) 05/26/2023 0958   CREATININE 2.03 (H) 08/16/2022 1415   CALCIUM  9.1 06/16/2024 0613   PROT 5.1 (L) 06/15/2024 1559   ALBUMIN 3.1 (L) 06/15/2024 1559   AST 70 (H) 06/15/2024 1559   AST 62 (H) 05/26/2023 0958   ALT 33 06/15/2024 1559   ALT 40 05/26/2023 0958   ALKPHOS 41 06/15/2024 1559   BILITOT 1.1 06/15/2024 1559   BILITOT 1.0 05/26/2023 0958   GFRNONAA 31 (L) 06/16/2024 0613   GFRNONAA 30 (L) 05/26/2023 0958   GFRAA 34 01/31/2020 0000   Lipase  No results found for: LIPASE     Studies/Results: MR ANGIO ABDOMEN W CONTRAST Result Date: 06/16/2024 CLINICAL DATA:  Fall, perivascular edema EXAM: MRI ABDOMEN WITHOUT AND WITH CONTRAST TECHNIQUE: Multiplanar multisequence MR  imaging of the abdomen was performed both before and after the administration of intravenous contrast. CONTRAST:  9mL GADAVIST  GADOBUTROL  1 MMOL/ML IV SOLN COMPARISON:  None Available. FINDINGS: Lower chest: No acute findings. Hepatobiliary: Significant respiratory motion artifact. No intrahepatic mass or intrahepatic biliary ductal dilation on this limited examination. Cholelithiasis. Portal vein is patent. Left hepatic vein is atretic. Middle and right hepatic veins are patent. Narrowing of the intrahepatic inferior vena cava, however, this vessel remains patent. Pancreas:  Limited evaluation due to respiratory motion  artifact. Spleen: Mild splenomegaly. No intrasplenic lesion identified. No parenchymal laceration. Trace perisplenic edema Adrenals/Urinary Tract: Adrenal glands are. Kidneys are normal in size and position. No hydronephrosis. No intrarenal mass. Exophytic simple cortical cyst arises from the lower pole the left kidney for which no follow-up imaging is recommended. Stomach/Bowel: Mild ascites. Evaluation of bowel is markedly limited by respiratory motion artifact. Vascular/Lymphatic: Variant anatomy with common origin of the celiac axis and superior mesenteric artery. Wide patency of the vessels without dissection or aneurysm. Renal arteries are widely patent and demonstrate normal vascular morphology. Inferior mesenteric artery is widely patent. Visualized lower extremity arterial inflow is widely patent. Iliofemoral venous structures are patent. Renal veins are patent bilaterally. Superior and inferior mesenteric veins are patent. Splenic vein is patent. Other:  None significant Musculoskeletal: Known thoracic spine fractures not well appreciated on this examination. Edema is seen within the disc space at T11-12 in keeping with known hyperextension injury at this level. IMPRESSION: 1. Significant respiratory motion artifact limits evaluation. The mesenteric and renal arterial structures appear widely patent evidence of dissection or aneurysm, however. Abdominal venous structures are patent. 2. Cholelithiasis. 3. Mild splenomegaly. 4. Mild ascites. 5. Known thoracic spine fractures not well appreciated on this examination. Edema is seen within the disc space at T11-12 in keeping with known hyperextension injury at this level. Electronically Signed   By: Dorethia Molt M.D.   On: 06/16/2024 00:55   MR Cervical Spine Wo Contrast Result Date: 06/15/2024 CLINICAL DATA:  Neck trauma, ligament injury suspected (Age >= 16y); Back trauma, abnormal neuro exam, CT or xray positive (Age >= 16y) EXAM: MRI CERVICAL AND  THORACIC SPINE WITHOUT CONTRAST TECHNIQUE: Multiplanar and multiecho pulse sequences of the cervical spine, to include the craniocervical junction and cervicothoracic junction, and the thoracic spine, were obtained without intravenous contrast. COMPARISON:  None Available. FINDINGS: MRI CERVICAL SPINE FINDINGS Alignment: Similar widening of the anterior C6-C7 disc space. No substantial sagittal subluxation. Vertebrae: Acute fracture through the anterior/superior C7 vertebral body with disruption of the adjacent anterior longitudinal ligament. Cord: Normal cord signal. Posterior Fossa, vertebral arteries, paraspinal tissues: Small volume of prevertebral edema extends from the site of fracture at C7 inferiorly into the upper thoracic spine. Mild posterior paraspinal edema, compatible with ligamentous strain. Edema in the C6-C7 disc space, compatible with inter spinous strain. Disc levels: Superimposed multilevel degenerative change with ligamentum flavum thickening at C6-C7 and small posterior disc osteophyte complex resulting and mild canal stenosis. There is trace (1-2 mm thick) posterior epidural hemorrhage extending from C4-C5 to C6-C7 (for example see series 11, image 9 and series 13, image 23). Resulting mild canal stenosis. MRI THORACIC SPINE FINDINGS Alignment: Widening of the T10-T11 disc space anteriorly. No substantial sagittal subluxation. Vertebrae: Disruption of the anterior longitudinal ligament at T10-T11 with traumatic injury to the disc space with associated edema. Fracture plane extends through the T11 vertebral body and was further characterized on same day CT of the chest/abdomen/pelvis. Cord:  Normal cord signal.  Paraspinal and other soft tissues: Please see same day CT of the chest/abdomen/pelvis for intrathoracic findings. There is small volume of pre vertebral edema extending from the site of injury at T7-T11 superiorly to T8 inferiorly at T12. No significant posterior paraspinal edema. Disc  levels: No high-grade canal or foraminal stenosis. IMPRESSION: MRI cervical spine: 1. Acute C7 anterior/superior fracture with adjacent disruption of the anterior longitudinal ligament and traumatic prevertebral edema. 2. Trace (1 to 2 mm thick) posterior epidural hemorrhage spanning from C4-C5 to C6-C7 with mild canal stenosis. 3. Posterior paraspinal and C6-C7 interspinous ligamentous strain. 4. C5-C6 osteophyte fracture better characterized on recent CT of the cervical spine. MRI thoracic spine: 1. Acute T11 fracture with disruption of the adjacent anterior longitudinal ligament, adjacent prevertebral edema, and traumatic T10-T11 disc injury. 2. No significant stenosis. Electronically Signed   By: Gilmore GORMAN Molt M.D.   On: 06/15/2024 22:02   MR THORACIC SPINE WO CONTRAST Result Date: 06/15/2024 CLINICAL DATA:  Neck trauma, ligament injury suspected (Age >= 16y); Back trauma, abnormal neuro exam, CT or xray positive (Age >= 16y) EXAM: MRI CERVICAL AND THORACIC SPINE WITHOUT CONTRAST TECHNIQUE: Multiplanar and multiecho pulse sequences of the cervical spine, to include the craniocervical junction and cervicothoracic junction, and the thoracic spine, were obtained without intravenous contrast. COMPARISON:  None Available. FINDINGS: MRI CERVICAL SPINE FINDINGS Alignment: Similar widening of the anterior C6-C7 disc space. No substantial sagittal subluxation. Vertebrae: Acute fracture through the anterior/superior C7 vertebral body with disruption of the adjacent anterior longitudinal ligament. Cord: Normal cord signal. Posterior Fossa, vertebral arteries, paraspinal tissues: Small volume of prevertebral edema extends from the site of fracture at C7 inferiorly into the upper thoracic spine. Mild posterior paraspinal edema, compatible with ligamentous strain. Edema in the C6-C7 disc space, compatible with inter spinous strain. Disc levels: Superimposed multilevel degenerative change with ligamentum flavum  thickening at C6-C7 and small posterior disc osteophyte complex resulting and mild canal stenosis. There is trace (1-2 mm thick) posterior epidural hemorrhage extending from C4-C5 to C6-C7 (for example see series 11, image 9 and series 13, image 23). Resulting mild canal stenosis. MRI THORACIC SPINE FINDINGS Alignment: Widening of the T10-T11 disc space anteriorly. No substantial sagittal subluxation. Vertebrae: Disruption of the anterior longitudinal ligament at T10-T11 with traumatic injury to the disc space with associated edema. Fracture plane extends through the T11 vertebral body and was further characterized on same day CT of the chest/abdomen/pelvis. Cord:  Normal cord signal. Paraspinal and other soft tissues: Please see same day CT of the chest/abdomen/pelvis for intrathoracic findings. There is small volume of pre vertebral edema extending from the site of injury at T7-T11 superiorly to T8 inferiorly at T12. No significant posterior paraspinal edema. Disc levels: No high-grade canal or foraminal stenosis. IMPRESSION: MRI cervical spine: 1. Acute C7 anterior/superior fracture with adjacent disruption of the anterior longitudinal ligament and traumatic prevertebral edema. 2. Trace (1 to 2 mm thick) posterior epidural hemorrhage spanning from C4-C5 to C6-C7 with mild canal stenosis. 3. Posterior paraspinal and C6-C7 interspinous ligamentous strain. 4. C5-C6 osteophyte fracture better characterized on recent CT of the cervical spine. MRI thoracic spine: 1. Acute T11 fracture with disruption of the adjacent anterior longitudinal ligament, adjacent prevertebral edema, and traumatic T10-T11 disc injury. 2. No significant stenosis. Electronically Signed   By: Gilmore GORMAN Molt M.D.   On: 06/15/2024 22:02   CT CHEST ABDOMEN PELVIS WO CONTRAST Addendum Date: 06/15/2024 ADDENDUM REPORT: 06/15/2024 20:47 ADDENDUM: There is an error in the above report. The  fractured vertebral body is T11 and the disruption of the  anterior longitudinal ligament with widening of the disc space is at T10-11. No other changes are made to this report. These results were called by telephone at the time of interpretation on 06/15/2024 at 8:46 pm to provider Charlyn, MD, who verbally acknowledged these results. Electronically Signed   By: Dorethia Molt M.D.   On: 06/15/2024 20:47   Result Date: 06/15/2024 CLINICAL DATA:  Blunt polytrauma EXAM: CT CHEST, ABDOMEN AND PELVIS WITHOUT CONTRAST TECHNIQUE: Multidetector CT imaging of the chest, abdomen and pelvis was performed following the standard protocol without IV contrast. RADIATION DOSE REDUCTION: This exam was performed according to the departmental dose-optimization program which includes automated exposure control, adjustment of the mA and/or kV according to patient size and/or use of iterative reconstruction technique. COMPARISON:  None Available. FINDINGS: CT CHEST FINDINGS Cardiovascular: Moderate left anterior descending coronary artery calcification. Global cardiac size within limits. No pericardial effusion. Central pulmonary arteries are of normal caliber. There is mild diffuse form dilation of the ascending aorta measuring 4.1 by cm in maximal diameter. Descending thoracic aorta is of normal caliber. Moderate atherosclerotic calcification within thoracic aorta. Mediastinum/Nodes: 2.2 cm left thyroid  nodule, better assessed on prior thyroid  sonogram of 05/16/2023 and biopsied on 07/28/2023. No follow-up imaging is recommended. No pathologic thoracic adenopathy. Esophagus is unremarkable. Lungs/Pleura: Small left hemo pneumothorax is present with a small amount of hemorrhagic fluid and a small anterior gaseous component. No CT evidence of tension physiology. No pneumothorax or pleural effusion on the right. Bibasilar dependent atelectasis. Debris within the trachea and right mainstem bronchus noted. Musculoskeletal: There are acute, minimally displaced fractures of the left 7-9 ribs  posteriorly. There is a vertical cleft fracture of the T12 vertebral body (70/6) as well as destruction of the anterior tongue tube ligament and widening of the T11-12 disc space seen anteriorly in keeping with a hyperextension injury. The posterior wall of the T12 vertebral body appears intact, as are the posterior elements at T11-12. No listhesis or facetal dislocation. Multiple healed right rib fractures are noted. Right total shoulder arthroplasty has been performed. CT ABDOMEN PELVIS FINDINGS Hepatobiliary: Cholelithiasis without superimposed evidence of acute cholecystitis. Mild perihepatic ascites. No definite parenchymal laceration identified on this noncontrast examination. No intra or extrahepatic biliary ductal dilation. 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 No superimposed acute peripancreatic inflammatory changes are identified. Spleen: There is trace perisplenic fluid identified (17/2) which is relatively low attenuation and likely represent simple ascites. No parenchymal lacerations clearly identified on this noncontrast examination. The spleen is in size. Adrenals/Urinary Tract: Adrenal glands are unremarkable. 8 cm simple cortical cyst is seen within the lower pole the left kidney for which no follow-up imaging is recommended. The kidneys are otherwise unremarkable. Bladder unremarkable. Stomach/Bowel: Stomach is within normal limits. Appendix appears normal. No evidence of bowel wall thickening, distention, or inflammatory changes. No free intraperitoneal gas. Vascular/Lymphatic: There is infiltration retroperitoneum surrounding the origins of the superior mesenteric artery and celiac axis. A vascular injury is not clearly identified, however, evaluation is limited by noncontrast technique. Retroperitoneal edema, venous hemorrhage or retroperitoneal fibrosis could appear similarly. Reproductive:  Prostate is unremarkable. Other: No abdominal wall hernia Musculoskeletal: No acute bone abnormality involving the abdomen and pelvis. IMPRESSION: 1. Small left hemopneumothorax. No CT evidence of tension physiology. 2. Acute, minimally displaced fractures of the left 7-9 ribs posteriorly. 3. Vertical cleft fracture of the T12 vertebral  body with disruption of the anterior longitudinal ligament and widening of the T11-12 disc space in keeping with a hyperextension injury. The posterior wall of the T12 vertebral body appears intact, as are the posterior elements at T11-12. No listhesis or facetal dislocation. 4. Infiltration of the retroperitoneum surrounding the origins of the superior mesenteric artery and celiac axis. A vascular injury is not clearly identified, however, evaluation is limited by noncontrast technique. Retroperitoneal edema, venous hemorrhage or retroperitoneal fibrosis could appear similarly. If there is clinical concern for vascular injury, CT arteriography is recommended for further evaluation. 5. Mild ascites, nonspecific. 6. Cholelithiasis 7. Moderate coronary artery calcification. 8. 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 Recommend annual imaging followup by CTA or MRA. This recommendation follows 2010 ACCF/AHA/AATS/ACR/ASA/SCA/SCAI/SIR/STS/SVM Guidelines for the Diagnosis and Management of Patients with Thoracic Aortic Disease. Circulation. 2010; 121: [id number]. Aortic aneurysm NOS (ICD10-I71.9) Aortic Atherosclerosis (ICD10-I70.0). Electronically Signed: By: Dorethia Molt M.D. On: 06/15/2024 20:39   CT HEAD WO CONTRAST Result Date: 06/15/2024 CLINICAL DATA:  Provided history: Polytrauma, blunt. Head trauma, moderate/severe. EXAM: CT HEAD WITHOUT CONTRAST CT CERVICAL SPINE WITHOUT CONTRAST TECHNIQUE: Multidetector CT imaging of the head and cervical spine was performed following the standard protocol without intravenous contrast. Multiplanar CT image  reconstructions of the cervical spine were also generated. RADIATION DOSE REDUCTION: This exam was performed according to the departmental dose-optimization program which includes automated exposure control, adjustment of the mA and/or kV according to patient size and/or use of iterative reconstruction technique. COMPARISON:  CT angiogram head/neck 07/28/2023. Thyroid  ultrasound 05/16/2023. FINDINGS: CT HEAD FINDINGS Brain: Mild generalized cerebral atrophy. 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 Background mild patchy and ill-defined hypoattenuation within the cerebral white matter, nonspecific but compatible with chronic small vessel ischemic disease. Known small chronic infarcts within the left cerebellar hemisphere. There is no acute intracranial hemorrhage. No demarcated cortical infarct. No extra-axial fluid collection. No evidence of an intracranial mass. No midline shift. Vascular: No hyperdense vessel.  Atherosclerotic calcification. Skull: No calvarial fracture or aggressive osseous lesion. Sinuses/Orbits: No mass or acute finding within the imaged orbits. Partial opacification of a posterior right ethmoid air cell. Other: Redemonstrated chronic nasal bone fracture deformities. Midline parietal scalp hematoma. CT CERVICAL SPINE FINDINGS Alignment: No significant spondylolisthesis. Widening of the C6-C7 intervertebral disc space anteriorly (to 6 mm). Skull base and vertebrae: The basion-dental and atlanto-dental intervals are maintained. Acute, displaced fracture through the anterosuperior corner of the C7 vertebral body (also involving an adjacent C6-C7 ventral osteophyte) (for instance as seen on series 7, image 31). Nondisplaced acute fracture through a C5-C6 ventral osteophyte to the right (for instance as seen on series 7, image 25). Soft tissues and spinal canal: No prevertebral fluid or swelling. No visible canal hematoma. Known thyroid  nodules which were previously assessed  with thyroid  ultrasound 05/16/2023. Additionally, one of the nodules within the left thyroid  lobe was previously biopsied. Please correlate with the findings on the prior thyroid  ultrasound of 05/16/2023 and with pathology from prior thyroid  nodule fine-needle aspiration. 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 Disc space narrowing is greatest at C5-C6 (moderate-to-advanced at this level). No appreciable high-grade spinal canal stenosis. Multilevel bony neural foraminal narrowing. Multilevel ventral osteophytes, most prominent at C5-C6 and C6-C7. Upper chest: Incompletely imaged left apical pneumothorax (series 4, image 94). Other: Multiple stents within the left vertebral artery. Cervical spine CT impressions #1, #2 and #4 called by  telephone at the time of interpretation on 06/15/2024 at 4:48 pm to provider [facility] , who verbally acknowledged these results. IMPRESSION: CT head: 1.  No evidence of an acute intracranial abnormality. 2. Midline parietal scalp hematoma. 3. Parenchymal atrophy, chronic small vessel ischemic disease and unchanged chronic infarcts, as described. 4. Minor right ethmoid sinus disease. CT cervical spine: 1. Acute, displaced fracture through the anterosuperior corner of the C7 vertebral body (also involving an adjacent C6-C7 ventral osteophyte). There is widening of the C6-C7 intervertebral disc space anteriorly (to 6 mm). A cervical spine MRI is recommended to assess for ligamentous injury, and to assess for involvement of the C6-C7 disc space. 2. Acute nondisplaced fracture through a C5-C6 ventral osteophyte. 3. Cervical spondylosis as described. 4. Incompletely imaged left apical pneumothorax. Electronically Signed   By: Rockey Childs D.O.   On: 06/15/2024 16:56   CT CERVICAL SPINE WO CONTRAST Result Date: 06/15/2024 CLINICAL DATA:  Provided history:  Polytrauma, blunt. Head trauma, moderate/severe. EXAM: CT HEAD WITHOUT CONTRAST CT CERVICAL SPINE WITHOUT CONTRAST TECHNIQUE: Multidetector CT imaging of the head and cervical spine was performed following the standard protocol without intravenous contrast. Multiplanar CT image reconstructions of the cervical spine were also generated. RADIATION DOSE REDUCTION: This exam was performed according to the departmental dose-optimization program which includes automated exposure control, adjustment of the mA and/or kV according to patient size and/or use of iterative reconstruction technique. COMPARISON:  CT angiogram head/neck 07/28/2023. Thyroid  ultrasound 05/16/2023. FINDINGS: CT HEAD FINDINGS Brain: Mild generalized cerebral atrophy. 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 Background mild patchy and ill-defined hypoattenuation within the cerebral white matter, nonspecific but compatible with chronic small vessel ischemic disease. Known small chronic infarcts within the left cerebellar hemisphere. There is no acute intracranial hemorrhage. No demarcated cortical infarct. No extra-axial fluid collection. No evidence of an intracranial mass. No midline shift. Vascular: No hyperdense vessel.  Atherosclerotic calcification. Skull: No calvarial fracture or aggressive osseous lesion. Sinuses/Orbits: No mass or acute finding within the imaged orbits. Partial opacification of a posterior right ethmoid air cell. Other: Redemonstrated chronic nasal bone fracture deformities. Midline parietal scalp hematoma. CT CERVICAL SPINE FINDINGS Alignment: No significant spondylolisthesis. Widening of the C6-C7 intervertebral disc space anteriorly (to 6 mm). Skull base and vertebrae: The basion-dental and atlanto-dental intervals are maintained. Acute, displaced fracture through the anterosuperior corner of the C7 vertebral body (also involving an adjacent C6-C7 ventral osteophyte) (for instance as seen on series 7,  image 31). Nondisplaced acute fracture through a C5-C6 ventral osteophyte to the right (for instance as seen on series 7, image 25). Soft tissues and spinal canal: No prevertebral fluid or swelling. No visible canal hematoma. Known thyroid  nodules which were previously assessed with thyroid  ultrasound 05/16/2023. Additionally, one of the nodules within the left thyroid  lobe was previously biopsied. Please correlate with the findings on the prior thyroid  ultrasound of 05/16/2023 and with pathology from prior thyroid  nodule fine-needle aspiration. 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 Disc space narrowing is greatest at C5-C6 (moderate-to-advanced at this level). No appreciable high-grade spinal canal stenosis. Multilevel bony neural foraminal narrowing. Multilevel ventral osteophytes, most prominent at C5-C6 and C6-C7. Upper chest: Incompletely imaged left apical pneumothorax (series 4, image 94). Other: Multiple stents within the left vertebral artery. Cervical spine CT impressions #1, #2 and #4 called by telephone at the time of interpretation on 06/15/2024 at 4:48 pm to provider ANKIT NANAVATI ,  who verbally acknowledged these results. IMPRESSION: CT head: 1.  No evidence of an acute intracranial abnormality. 2. Midline parietal scalp hematoma. 3. Parenchymal atrophy, chronic small vessel ischemic disease and unchanged chronic infarcts, as described. 4. Minor right ethmoid sinus disease. CT cervical spine: 1. Acute, displaced fracture through the anterosuperior corner of the C7 vertebral body (also involving an adjacent C6-C7 ventral osteophyte). There is widening of the C6-C7 intervertebral disc space anteriorly (to 6 mm). A cervical spine MRI is recommended to assess for ligamentous injury, and to assess for involvement of the C6-C7 disc space. 2. Acute nondisplaced fracture through a  C5-C6 ventral osteophyte. 3. Cervical spondylosis as described. 4. Incompletely imaged left apical pneumothorax. Electronically Signed   By: Rockey Childs D.O.   On: 06/15/2024 16:56   DG Lumbar Spine Complete Result Date: 06/15/2024 CLINICAL DATA:  Fall. EXAM: LUMBAR SPINE - COMPLETE 4+ VIEW COMPARISON:  Lumbar spine radiograph dated 07/29/2018. FINDINGS: Five lumbar type vertebra. There is no acute fracture or subluxation of the lumbar spine. Multilevel degenerative changes with disc space narrowing and spurring. Lower lumbar facet arthropathy. Mild atherosclerotic calcification of the abdominal aorta. There is a 1 cm radiopaque focus in the right flank which may represent a kidney stone. IMPRESSION: 1. No acute fracture or subluxation. 2. Multilevel degenerative changes. 3. Possible right kidney stone. Electronically Signed   By: Vanetta Chou M.D.   On: 06/15/2024 16:41   DG Chest Port 1 View Result Date: 06/15/2024 CLINICAL DATA:  Fall. EXAM: PORTABLE CHEST 1 VIEW COMPARISON:  August 19, 2022. FINDINGS: The heart size and mediastinal contours are within normal limits. Right lung is clear. Probable minimal left apical pneumothorax is noted. The visualized skeletal structures are unremarkable. IMPRESSION: Probable minimal left apical pneumothorax. CT scan may be performed for further evaluation. Electronically Signed   By: Lynwood Landy Raddle M.D.   On: 06/15/2024 16:01    Anti-infectives: Anti-infectives (From admission, onward)    None        Assessment/Plan  Mechanical GLF   C7 anterosuperior corner VB fx with involvement of  C6-C7 ventral osteophyte and widening of the C6-C7 intervertebral disc space - NSGY c/s, Dr. Debby, recs for c-spine MRI Acute nondisplaced fracture through C5-C6 ventral osteophyte - NSGY c/s, Dr. Debby, MRI C spine and T spine completed and awaiting updated recs T12 vertebral body fx with widening of T11-12 disc space - NSGY c/s, Dr. Debby, MRI C spine and T  spine completed and awaiting updated recs Occult L apical PTX - CT CAP showed small left HPTX, repeat CXR later today  L 7-9 rib fractures - multimodal pain control, IS, pulm toilet Haziness in retroperitoneum around SMA - MRA somewhat motion degraded but no clear vascular injury, abdominal exam is benign this AM Scalp laceration - small, local wound care BPH CKD stage III T2DM - SSI Hepatic cirrhosis HTN HLD Hx of CVA Gout  FEN: Cortrak placement, LR@ 75cc/h, SLP eval when NS recs clarified VTE: SCDs, LMWH, holding home plavix  for now ID: no current indication for abx  Dispo: Await final recs from NS. Will need therapies including swallow eval. Monitor UOP and labs.     LOS: 1 day   I reviewed Consultant NS notes, last 24 h vitals and pain scores, last 48 h intake and output, last 24 h labs and trends, and last 24 h imaging results.  This care required moderate level of medical decision making.    Burnard JONELLE Louder, PA-C  [facility] 06/16/2024, 8:43 AM Please see Amion for pager number during day hours 7:00am-4:30pm

## 2024-06-16 NOTE — Evaluation (Signed)
 Occupational Therapy Evaluation Patient Details Name: Dave Wright MRN: [id number] DOB: [birth year] Today's Date: 06/16/2024   History of Present Illness   Pt is a 83 yo male s/p fall. CT(+) acute displaced C7 fx, non displaced C5-6, T10-11 vertical cleft fx with disc space involvement, Small L HPTX and L 7-9 rib fxs.  Planned thoracic fx sx next Tuesday. PMH arthritis, chroinc daily HA, chronic leukopenia,CVA, DM2, gout, hepatic cirrhosis, HTN, HLD, OA,squamous cell carcinoma, Back surg, R total shoulder sx.     Clinical Impressions PTA patient independent with ADLs, ambulating with cane or rollator. Admitted for above and presents with problem list below.  He currently requires mod assist +2 safety for bed mobility, min assist +2 for transfers using RW, and setup to max assist for ADLs. He was educated on brace and back precautions, cervical collar on upon entry and donned TLSO in supine. VSS on RA during session.  Noted plan for surgery next Tuesday, but based on performance today believe pt will best benefit from continued OT services acutely and after dc at an inpatient setting with >3hrs/day to optimize independence, safety and return to PLOF with ADLs and mobility. Will follow acutely.       If plan is discharge home, recommend the following:   A little help with walking and/or transfers;A lot of help with bathing/dressing/bathroom;Assistance with cooking/housework;Assist for transportation;Help with stairs or ramp for entrance     Functional Status Assessment   Patient has had a recent decline in their functional status and demonstrates the ability to make significant improvements in function in a reasonable and predictable amount of time.     Equipment Recommendations   BSC/3in1     Recommendations for Other Services   Rehab consult     Precautions/Restrictions   Precautions Precautions: Back Precaution Booklet Issued: No Recall of Precautions/Restrictions:  Intact Precaution/Restrictions Comments: reviewed with pt Required Braces or Orthoses: Spinal Brace;Cervical Brace Cervical Brace: Hard collar;At all times Spinal Brace: Thoracolumbosacral orthotic;Applied in supine position (MD reports Supine don with pending surgery Tuesday 8/5) Restrictions Weight Bearing Restrictions Per Provider Order: No     Mobility Bed Mobility Overal bed mobility: Needs Assistance Bed Mobility: Rolling, Sidelying to Sit Rolling: Mod assist, Used rails Sidelying to sit: Mod assist, +2 for safety/equipment       General bed mobility comments: light mod assist to roll for brace mgmt  and come to EOB, cueing for technique and safety    Transfers Overall transfer level: Needs assistance Equipment used: Rolling walker (2 wheels) Transfers: Sit to/from Stand Sit to Stand: Min assist, +2 physical assistance           General transfer comment: to stand from EOB pulling on RW, stepping towards HOB and then to recliner with min assist +2      Balance Overall balance assessment: Needs assistance Sitting-balance support: No upper extremity supported, Feet supported Sitting balance-Leahy Scale: Fair     Standing balance support: Bilateral upper extremity supported, During functional activity Standing balance-Leahy Scale: Poor Standing balance comment: relies on RW  and external support                           ADL either performed or assessed with clinical judgement   ADL Overall ADL's : Needs assistance/impaired     Grooming: Set up;Sitting           Upper Body Dressing : Maximal assistance;Bed level   Lower Body Dressing:  Maximal assistance;Bed level   Toilet Transfer: Minimal assistance;+2 for physical assistance;Rolling walker (2 wheels) Toilet Transfer Details (indicate cue type and reason): to recliner         Functional mobility during ADLs: Minimal assistance;Moderate assistance;+2 for physical assistance;+2 for  safety/equipment;Cueing for safety;Cueing for sequencing;Rolling walker (2 wheels)       Vision   Vision Assessment?: No apparent visual deficits     Perception         Praxis         Pertinent Vitals/Pain Pain Assessment Pain Assessment: Faces Faces Pain Scale: Hurts little more Pain Location: generalized Pain Descriptors / Indicators: Discomfort, Sore Pain Intervention(s): Limited activity within patient's tolerance, Monitored during session, Repositioned     Extremity/Trunk Assessment Upper Extremity Assessment Upper Extremity Assessment: Generalized weakness   Lower Extremity Assessment Lower Extremity Assessment: Defer to PT evaluation   Cervical / Trunk Assessment Cervical / Trunk Assessment: Other exceptions Cervical / Trunk Exceptions: cervical and thoracic fx, bracing   Communication Communication Communication: No apparent difficulties   Cognition Arousal: Alert Behavior During Therapy: WFL for tasks assessed/performed Cognition: No apparent impairments             OT - Cognition Comments: not formally assessed but appears [facility]                 Following commands: Intact       Cueing  General Comments   Cueing Techniques: Verbal cues  VSS, spouse arrived at end of session   Exercises     Shoulder Instructions      Home Living Family/patient expects to be discharged to:: Private residence Living Arrangements: Spouse/significant other Available Help at Discharge: Family;Available 24 hours/day Type of Home: House Home Access: Ramped entrance     Home Layout: One level     Bathroom Shower/Tub: Walk-in shower;Tub/shower unit   Bathroom Toilet: Handicapped height     Home Equipment: Grab bars - tub/shower;Rollator (4 wheels);Cane - single point;Lift chair          Prior Functioning/Environment Prior Level of Function : Independent/Modified Independent;History of Falls (last six months)             Mobility Comments:  cane inside the house, cane in community/walking in morning, able to get up from higher surfaces ADLs Comments: ind ADLs; spouse drives    OT Problem List: Decreased strength;Decreased activity tolerance;Impaired balance (sitting and/or standing);Pain;Decreased knowledge of use of DME or AE;Decreased knowledge of precautions   OT Treatment/Interventions: Self-care/ADL training;Therapeutic exercise;DME and/or AE instruction;Therapeutic activities;Patient/family education;Balance training      OT Goals(Current goals can be found in the care plan section)   Acute Rehab OT Goals Patient Stated Goal: get better OT Goal Formulation: With patient Time For Goal Achievement: 06/30/24 Potential to Achieve Goals: Good   OT Frequency:  Min 2X/week    Co-evaluation PT/OT/SLP Co-Evaluation/Treatment: Yes Reason for Co-Treatment: For patient/therapist safety;To address functional/ADL transfers   OT goals addressed during session: ADL's and self-care      AM-PAC OT 6 Clicks Daily Activity     Outcome Measure Help from another person eating meals?: Total (NPO) Help from another person taking care of personal grooming?: A Little Help from another person toileting, which includes using toliet, bedpan, or urinal?: A Lot Help from another person bathing (including washing, rinsing, drying)?: A Lot Help from another person to put on and taking off regular upper body clothing?: A Little Help from another person to put on and taking off  regular lower body clothing?: A Lot 6 Click Score: 13   End of Session Equipment Utilized During Treatment: Gait belt;Rolling walker (2 wheels);Back brace;Cervical collar Nurse Communication: Mobility status  Activity Tolerance: Patient tolerated treatment well Patient left: in chair;with call bell/phone within reach;with family/visitor present  OT Visit Diagnosis: Other abnormalities of gait and mobility (R26.89);Muscle weakness (generalized)  (M62.81);Pain;History of falling (Z91.81) Pain - part of body:  (generalized)                Time: 8796-8763 OT Time Calculation (min): 33 min Charges:  OT General Charges $OT Visit: 1 Visit OT Evaluation $OT Eval Moderate Complexity: 1 Mod  Etta NOVAK, OT Acute Rehabilitation Services Office [phone] Secure Chat Preferred    Etta GORMAN [facility] 06/16/2024, 1:55 PM

## 2024-06-16 NOTE — Progress Notes (Signed)
 PT Cancellation Note  Patient Details Name: Dave Wright MRN: [id number] DOB: 1941-10-15   Cancelled Treatment:    Reason Eval/Treat Not Completed: Patient not medically ready. Pt with possible unstable spinal injury and on bedrest. Will continue to monitor for readiness.   [facility] 06/16/2024, 8:42 AM [facility] PT Acute [employer] [phone]

## 2024-06-16 NOTE — Consult Note (Signed)
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 vertebral artery. Cervical spine CT impressions #1, #2 and #4 called by telephone at the time of interpretation on 06/15/2024 at 4:48 pm to provider [facility] , who verbally acknowledged these results. IMPRESSION: CT head: 1.  No evidence of an acute intracranial abnormality. 2. Midline parietal scalp hematoma. 3. Parenchymal atrophy, chronic small vessel ischemic disease and unchanged chronic infarcts, as described. 4. Minor right ethmoid sinus disease. CT cervical spine: 1. Acute, displaced fracture through the anterosuperior corner of the C7 vertebral body (also involving an adjacent C6-C7 ventral osteophyte). There is widening of the C6-C7 intervertebral disc space anteriorly (to 6 mm). A cervical spine MRI is recommended to assess for ligamentous injury, and to assess for involvement of the C6-C7 disc space. 2. Acute nondisplaced fracture through a C5-C6 ventral osteophyte. 3. Cervical spondylosis as described. 4. Incompletely imaged left apical pneumothorax. Electronically Signed   By: Rockey Childs D.O.   On: 06/15/2024 16:56   CT CERVICAL SPINE WO CONTRAST Result Date: 06/15/2024 CLINICAL DATA:  Provided history: Polytrauma, blunt. Head trauma, moderate/severe. EXAM: CT HEAD WITHOUT CONTRAST CT CERVICAL SPINE  WITHOUT CONTRAST TECHNIQUE: Multidetector CT imaging of the head and cervical spine was performed following the standard protocol without intravenous contrast. Multiplanar CT image reconstructions of the cervical spine were also generated. RADIATION DOSE REDUCTION: This exam was performed according to the departmental dose-optimization program which includes automated exposure control, adjustment of the mA and/or kV according to patient size and/or use of iterative reconstruction technique. COMPARISON:  CT angiogram head/neck 07/28/2023. Thyroid  ultrasound 05/16/2023. FINDINGS: CT HEAD FINDINGS Brain: Mild generalized cerebral atrophy. 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 Background mild patchy and ill-defined hypoattenuation within the cerebral white matter, nonspecific but compatible with chronic small vessel ischemic disease. Known small chronic infarcts within the left cerebellar hemisphere. There is no acute intracranial hemorrhage. No demarcated cortical infarct. No extra-axial fluid collection. No evidence of an intracranial mass. No midline shift. Vascular: No hyperdense vessel.  Atherosclerotic calcification. Skull: No calvarial fracture or aggressive osseous lesion. Sinuses/Orbits: No mass or acute finding within the imaged orbits. Partial opacification of a posterior right ethmoid air cell. Other: Redemonstrated chronic nasal bone fracture deformities. Midline parietal scalp hematoma. CT CERVICAL SPINE FINDINGS Alignment: No significant spondylolisthesis. Widening of the C6-C7 intervertebral disc space anteriorly (to 6 mm). Skull base and vertebrae: The basion-dental and atlanto-dental intervals are maintained. Acute, displaced fracture through the anterosuperior corner of the C7 vertebral body (also involving an adjacent C6-C7 ventral osteophyte) (for instance as seen on series 7, image 31). Nondisplaced acute fracture through a C5-C6 ventral osteophyte to the right (for  instance as seen on series  7, image 25). Soft tissues and spinal canal: No prevertebral fluid or swelling. No visible canal hematoma. Known thyroid  nodules which were previously assessed with thyroid  ultrasound 05/16/2023. Additionally, one of the nodules within the left thyroid  lobe was previously biopsied. Please correlate with the findings on the prior thyroid  ultrasound of 05/16/2023 and with pathology from prior thyroid  nodule fine-needle aspiration. 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 Disc space narrowing is greatest at C5-C6 (moderate-to-advanced at this level). No appreciable high-grade spinal canal stenosis. Multilevel bony neural foraminal narrowing. Multilevel ventral osteophytes, most prominent at C5-C6 and C6-C7. Upper chest: Incompletely imaged left apical pneumothorax (series 4, image 94). Other: Multiple stents within the left vertebral artery. Cervical spine CT impressions #1, #2 and #4 called by telephone at the time of interpretation on 06/15/2024 at 4:48 pm to provider [facility] , who verbally acknowledged these results. IMPRESSION: CT head: 1.  No evidence of an acute intracranial abnormality. 2. Midline parietal scalp hematoma. 3. Parenchymal atrophy, chronic small vessel ischemic disease and unchanged chronic infarcts, as described. 4. Minor right ethmoid sinus disease. CT cervical spine: 1. Acute, displaced fracture through the anterosuperior corner of the C7 vertebral body (also involving an adjacent C6-C7 ventral osteophyte). There is widening of the C6-C7 intervertebral disc space anteriorly (to 6 mm). A cervical spine MRI is recommended to assess for ligamentous injury, and to assess for involvement of the C6-C7 disc space. 2. Acute nondisplaced fracture through a C5-C6 ventral osteophyte. 3. Cervical spondylosis as described. 4. Incompletely imaged left  apical pneumothorax. Electronically Signed   By: Rockey Childs D.O.   On: 06/15/2024 16:56   DG Lumbar Spine Complete Result Date: 06/15/2024 CLINICAL DATA:  Fall. EXAM: LUMBAR SPINE - COMPLETE 4+ VIEW COMPARISON:  Lumbar spine radiograph dated 07/29/2018. FINDINGS: Five lumbar type vertebra. There is no acute fracture or subluxation of the lumbar spine. Multilevel degenerative changes with disc space narrowing and spurring. Lower lumbar facet arthropathy. Mild atherosclerotic calcification of the abdominal aorta. There is a 1 cm radiopaque focus in the right flank which may represent a kidney stone. IMPRESSION: 1. No acute fracture or subluxation. 2. Multilevel degenerative changes. 3. Possible right kidney stone. Electronically Signed   By: Vanetta Chou M.D.   On: 06/15/2024 16:41   DG Chest Port 1 View Result Date: 06/15/2024 CLINICAL DATA:  Fall. EXAM: PORTABLE CHEST 1 VIEW COMPARISON:  August 19, 2022. FINDINGS: The heart size and mediastinal contours are within normal limits. Right lung is clear. Probable minimal left apical pneumothorax is noted. The visualized skeletal structures are unremarkable. IMPRESSION: Probable minimal left apical pneumothorax. CT scan may be performed for further evaluation. Electronically Signed   By: Lynwood Landy Raddle M.D.   On: 06/15/2024 16:01     Awake, alert, oriented Speech fluent, appropriate 5/5 BUE/BLE Neg clonus Neg hoffman DTR 2+  Sensation grossly intact Collar in place   Assessment:   This is a 83 year old male with   -C5-6, 6-7 anterior osteophyte fracture with small epidural hematoma  -T10-T11 vertical cleft fracture with disc space involvement   Plan:   -Cervical fx may be treated conservatively with hard C collar for 6 weeks with outpatient follow up/radiographs.  -Plan on operative fixation/stabilization of thoracic fracture next Tuesday after holding Plavix .  -In the interim, patient may participate in therapies as tolerated. He  should don/doff TLSO when supine, and may be OOB with therapies.

## 2024-06-16 NOTE — Progress Notes (Signed)
   Inpatient Rehab Admissions Coordinator :  Per therapy recommendations, patient was screened for CIR candidacy by Heron Leavell RN MSN.  At this time patient appears to be a potential candidate for CIR but note OR planned for Tuesday, 8/5. I will hold on rehab consult to follow up postoperatively. Please call me with any questions.  Heron Leavell RN MSN Admissions Coordinator [phone]
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
## 2024-06-16 NOTE — TOC CAGE-AID Note (Signed)
 Transition of Care [facility]) - CAGE-AID Screening  Patient Details  Name: Dave Wright MRN: [id number] Date of Birth: Jan 12, 1941  Clinical Narrative:  Patient denies any current alcohol  or drug use, substance abuse resources not provided at this time.  CAGE-AID Screening:   Have You Ever Felt You Ought to Cut Down on Your Drinking or Drug Use?: No Have People Annoyed You By Critizing Your Drinking Or Drug Use?: No Have You Felt Bad Or Guilty About Your Drinking Or Drug Use?: No Have You Ever Had a Drink or Used Drugs First Thing In The Morning to Steady Your Nerves or to Get Rid of a Hangover?: No CAGE-AID Score: 0  Substance Abuse Education Offered: No

## 2024-06-16 NOTE — Progress Notes (Signed)
 OT Cancellation Note  Patient Details Name: Dave Wright MRN: [id number] DOB: 1941-10-28   Cancelled Treatment:    Reason Eval/Treat Not Completed: Active bedrest order ( per PA neuro notes states rolling precautions/ bedrest) OT will hold and await updated activity orders  Ely Molt 06/16/2024, 7:35 AM

## 2024-06-17 ENCOUNTER — Inpatient Hospital Stay (HOSPITAL_COMMUNITY)

## 2024-06-17 LAB — CBC
HCT: 25.1 % — ABNORMAL LOW (ref 39.0–52.0)
Hemoglobin: 8.3 g/dL — ABNORMAL LOW (ref 13.0–17.0)
MCH: 32.8 pg (ref 26.0–34.0)
MCHC: 33.1 g/dL (ref 30.0–36.0)
MCV: 99.2 fL (ref 80.0–100.0)
Platelets: 103 K/uL — ABNORMAL LOW (ref 150–400)
RBC: 2.53 MIL/uL — ABNORMAL LOW (ref 4.22–5.81)
RDW: 15.7 % — ABNORMAL HIGH (ref 11.5–15.5)
WBC: 5.7 K/uL (ref 4.0–10.5)
nRBC: 0 % (ref 0.0–0.2)

## 2024-06-17 LAB — GLUCOSE, CAPILLARY
Glucose-Capillary: 114 mg/dL — ABNORMAL HIGH (ref 70–99)
Glucose-Capillary: 125 mg/dL — ABNORMAL HIGH (ref 70–99)
Glucose-Capillary: 128 mg/dL — ABNORMAL HIGH (ref 70–99)
Glucose-Capillary: 138 mg/dL — ABNORMAL HIGH (ref 70–99)
Glucose-Capillary: 140 mg/dL — ABNORMAL HIGH (ref 70–99)
Glucose-Capillary: 142 mg/dL — ABNORMAL HIGH (ref 70–99)
Glucose-Capillary: 146 mg/dL — ABNORMAL HIGH (ref 70–99)

## 2024-06-17 LAB — BASIC METABOLIC PANEL WITH GFR
Anion gap: 8 (ref 5–15)
BUN: 35 mg/dL — ABNORMAL HIGH (ref 8–23)
CO2: 21 mmol/L — ABNORMAL LOW (ref 22–32)
Calcium: 8.8 mg/dL — ABNORMAL LOW (ref 8.9–10.3)
Chloride: 109 mmol/L (ref 98–111)
Creatinine, Ser: 1.76 mg/dL — ABNORMAL HIGH (ref 0.61–1.24)
GFR, Estimated: 38 mL/min — ABNORMAL LOW (ref >60)
Glucose, Bld: 125 mg/dL — ABNORMAL HIGH (ref 70–99)
Potassium: 4.4 mmol/L (ref 3.5–5.1)
Sodium: 138 mmol/L (ref 135–145)

## 2024-06-17 MED ORDER — BACITRACIN-NEOMYCIN-POLYMYXIN OINTMENT TUBE
TOPICAL_OINTMENT | Freq: Every day | CUTANEOUS | Status: DC
  Administered 2024-06-23 – 2024-06-24 (×2): 1 via TOPICAL
  Filled 2024-06-17: qty 14

## 2024-06-17 MED ORDER — METOPROLOL TARTRATE 25 MG PO TABS
25.0000 mg | ORAL_TABLET | Freq: Two times a day (BID) | ORAL | Status: DC
  Administered 2024-06-17 – 2024-06-18 (×3): 25 mg via ORAL
  Filled 2024-06-17 (×3): qty 1

## 2024-06-17 NOTE — Progress Notes (Signed)
 Physical Therapy Treatment Patient Details Name: Dave Wright MRN: [id number] DOB: 08/25/1941 Today's Date: 06/17/2024   History of Present Illness Pt is a 83 yo male s/p fall. CT(+) acute displaced C7 fx, non displaced C5-6, T10-11 vertical cleft fx with disc space involvement, Small L HPTX and L 7-9 rib fxs.  Planned thoracic fx sx next Tuesday. PMH arthritis, chroinc daily HA, chronic leukopenia,CVA, DM2, gout, hepatic cirrhosis, HTN, HLD, OA,squamous cell carcinoma, Back surg, R total shoulder sx.    PT Comments  Eager to mobilize. TLSO and hard collar already in place with HOB elevated when PT entered room. Mod assist to roll and rise with extensive review for log roll technique and precautions for neck and back.  Mod assist for boost to stand, RW to steady for support. Progressed with short distance gain in room at min assist level. No radicular symptoms reported throughout session. Assisted with repositioning upright in chair, feet flat on floor, instructed not to elevated LEs in recliner to reduce pressure on throacic fracture. Will follow up to assist back to bed. Patient will continue to benefit from skilled physical therapy services to further improve independence with functional mobility.    If plan is discharge home, recommend the following: A little help with walking and/or transfers;A little help with bathing/dressing/bathroom;Assist for transportation;Help with stairs or ramp for entrance   Can travel by private vehicle        Equipment Recommendations  None recommended by PT    Recommendations for Other Services Rehab consult     Precautions / Restrictions Precautions Precautions: Back;Cervical Precaution Booklet Issued: No Recall of Precautions/Restrictions: Intact Precaution/Restrictions Comments: reviewed with pt and wife Required Braces or Orthoses: Spinal Brace;Cervical Brace Cervical Brace: Hard collar;At all times Spinal Brace: Thoracolumbosacral  orthotic;Applied in supine position (MD reports Supine don with pending surgery Tuesday 8/5. Per neurosurgery pt OOB with therapy only.) Restrictions Weight Bearing Restrictions Per Provider Order: No Other Position/Activity Restrictions: TLSO needs to be on to elevate HOB     Mobility  Bed Mobility Overal bed mobility: Needs Assistance Bed Mobility: Rolling, Sit to Sidelying Rolling: Mod assist, Used rails       Sit to sidelying: Mod assist General bed mobility comments: Reviewed log roll techniques, pillow to support head when rolling. Mod assist for LE support to EOB, able to initiate roll well, cues throughout for safety, mod assist for trunk support to rise.    Transfers Overall transfer level: Needs assistance Equipment used: Rolling walker (2 wheels) Transfers: Sit to/from Stand Sit to Stand: Mod assist, From elevated surface           General transfer comment: Mod assist for boost to stand from slightly elevated bed surface, cues for technique to maintain cervical and back precautions. TLSO already donned in bed. Fair control with descent into chair, cues for technique    Ambulation/Gait Ambulation/Gait assistance: Min assist Gait Distance (Feet): 25 Feet Assistive device: Rolling walker (2 wheels) Gait Pattern/deviations: Step-through pattern, Decreased stride length, Trunk flexed Gait velocity: dec Gait velocity interpretation: <1.31 ft/sec, indicative of household ambulator   General Gait Details: Min assist for walker control with turns and backing up. LEs without buckling or overt LOB. Cues for upright posture.   Stairs             Wheelchair Mobility     Tilt Bed    Modified Rankin (Stroke Patients Only)       Balance Overall balance assessment: Needs assistance Sitting-balance support: No  upper extremity supported, Feet supported Sitting balance-Leahy Scale: Fair     Standing balance support: Bilateral upper extremity supported, During  functional activity Standing balance-Leahy Scale: Poor Standing balance comment: relies on RW  and external support                            Communication Communication Communication: No apparent difficulties  Cognition Arousal: Alert Behavior During Therapy: WFL for tasks assessed/performed   PT - Cognitive impairments: No apparent impairments                         Following commands: Intact      Cueing Cueing Techniques: Verbal cues  Exercises      General Comments General comments (skin integrity, edema, etc.): VSS  on 4L      Pertinent Vitals/Pain Pain Assessment Pain Assessment: Faces Faces Pain Scale: Hurts little more Pain Location: neck, back Pain Descriptors / Indicators: Sore Pain Intervention(s): Limited activity within patient's tolerance, Monitored during session, Repositioned    Home Living                          Prior Function            PT Goals (current goals can now be found in the care plan section) Acute Rehab PT Goals Patient Stated Goal: return home PT Goal Formulation: With patient/family Time For Goal Achievement: 06/30/24 Potential to Achieve Goals: Good Progress towards PT goals: Progressing toward goals    Frequency    Min 3X/week      PT Plan      Co-evaluation              AM-PAC PT 6 Clicks Mobility   Outcome Measure  Help needed turning from your back to your side while in a flat bed without using bedrails?: A Lot Help needed moving from lying on your back to sitting on the side of a flat bed without using bedrails?: A Lot Help needed moving to and from a bed to a chair (including a wheelchair)?: A Lot Help needed standing up from a chair using your arms (e.g., wheelchair or bedside chair)?: A Lot Help needed to walk in hospital room?: A Little Help needed climbing 3-5 steps with a railing? : Total 6 Click Score: 12    End of Session Equipment Utilized During Treatment:  Gait belt;Back brace;Cervical collar;Oxygen Activity Tolerance: Patient tolerated treatment well Patient left: with call bell/phone within reach;in chair;with chair alarm set;with nursing/sitter in room;with family/visitor present Nurse Communication: Mobility status;Precautions PT Visit Diagnosis: Unsteadiness on feet (R26.81);Other abnormalities of gait and mobility (R26.89);Muscle weakness (generalized) (M62.81);History of falling (Z91.81);Difficulty in walking, not elsewhere classified (R26.2);Pain Pain - part of body:  (back and neck)     Time: 1354-1430 PT Time Calculation (min) (ACUTE ONLY): 36 min  Charges:    $Gait Training: 8-22 mins $Therapeutic Activity: 8-22 mins PT General Charges $$ ACUTE PT VISIT: 1 Visit                     Leontine Roads, PT, DPT [facility]  Rehabilitation Services Physical Therapist Office: [phone] Website: [email or web address]    Leontine GORMAN Roads 06/17/2024, 4:17 PM

## 2024-06-17 NOTE — Plan of Care (Signed)
  Problem: SLP Dysphagia Goals Goal: Misc Dysphagia Goal Flowsheets (Taken 06/17/2024 1049) Misc Dysphagia Goal: Pt will complete pharyngeal strengthening exercises with min assist.

## 2024-06-17 NOTE — Procedures (Signed)
 Modified Barium Swallow Study  Patient Details  Name: DAIMIAN SUDBERRY MRN: [id number] Date of Birth: Mar 25, 1941  Today's Date: 06/17/2024  Modified Barium Swallow completed.  Full report located under Chart Review in the Imaging Section.  History of Present Illness Pt is a 83 yo male s/p fall. CT(+) acute displaced C7 fx, non displaced C5-6, T10-11 vertical cleft fx with disc space involvement, Small L HPTX and L 7-9 rib fxs.  Planned thoracic fx sx next Tuesday. 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 No aspiration observed during study; however, pt is at a risk of delayed aspiration given amount of pharyngeal residue observed.   Oral phased judged to be grossly WNL.   Pharyngeal deficits characterized by reduced hyolaryngeal elevation/excursion, reduced BOT retraction, absent epiglottic inversion, reduced laryngeal vestibule closure, reduced pharyngeal stripping, and reduced distention of UES.   Deficits resulted in consistent flash penetration of all liquids before/during the swallow and deep penetration (with ejection) of nectar-thick residue after the swallow. There was moderate-large vallecular residue and lateral channel residue following the majority of trials. The amount of residue increased as the viscosity of the trial increased.   Multiple swallows, liquid washes, and effortful swallows were minimally effective for reducing pharyngeal residue. Pt was sensate to penetration events and would clear throat to protect airway; however, this was ineffective for completely clearing penetration.   Esophageal phase significant for min-moderate esophageal retention of trials throughout the esophagus with no backflow  observed.   Recommend continue NPO with ice chips for oral gratification and effortful swallow exercises. Q4 oral care. PO meds crushed in applesauce until Cortrak can be placed.   SLP will continue to follow.    Factors that may increase risk of adverse event in presence of aspiration Noe [employer] Lianne 2021): Dependence for feeding and/or oral hygiene;Limited mobility;Frail or deconditioned  Swallow Evaluation Recommendations Recommendations: NPO;NPO except meds;Ice chips PRN after oral care (crushed meds until Cortrak is placed) Liquid Administration via: Spoon Medication Administration: Crushed with puree (until cortrak is placed) Supervision: Staff to assist with self-feeding Swallowing strategies  : Minimize environmental distractions;Slow rate;Small bites/sips;effortful swallow Oral care recommendations: Oral care QID (4x/day)      Braylin Formby J Courteney Alderete 06/17/2024,10:56 AM

## 2024-06-17 NOTE — TOC Initial Note (Signed)
 Transition of Care [facility]) - Initial/Assessment Note    Patient Details  Name: Dave Wright MRN: [id number] Date of Birth: Mar 21, 1941  [facility] CM/SW Contact:    Alf Doyle E Aneth Schlagel, LCSW Phone Number: 06/17/2024, 11:36 AM  Clinical Narrative:                 Patient admitted post fall.  Patient is from home with his wife. Patient's wife Ronal Caldron states they have a good support system including a daughter, granddaughter, nieces, and nephews. PCP is Dr. McGowen. Patient has DME at home including a walker, rollator, cane, grab bars, and lift chair. Patient has had Home Health PT in the past per wife.  Therapy is currently recommending CIR. Patient is scheduled for a procedure next week. Patient and wife are interested in CIR if appropriate. Patient's wife states if patient needed SNF, their first choice would be [facility]. TOC will continue to follow.   Expected Discharge Plan: IP Rehab Facility Barriers to Discharge: Continued Medical Work up   Patient Goals and CMS Choice   CMS Medicare.gov Compare Post Acute Care list provided to:: Patient Represenative (must comment) Choice offered to / list presented to : Spouse      Expected Discharge Plan and Services       Living arrangements for the past 2 months: Single Family Home                                      Prior Living Arrangements/Services Living arrangements for the past 2 months: Single Family Home Lives with:: Spouse Patient language and need for interpreter reviewed:: Yes Do you feel safe going back to the place where you live?: Yes      Need for Family Participation in Patient Care: Yes (Comment) Care giver support system in place?: Yes (comment) Current home services: DME Criminal Activity/Legal Involvement Pertinent to Current Situation/Hospitalization: No - Comment as needed  Activities of Daily Living      Permission Sought/Granted Permission sought to share information with :  Oceanographer granted to share information with : Yes, Verbal Permission Granted     Permission granted to share info w AGENCY: CIR, SNF, etc as needed for DC planning        Emotional Assessment       Orientation: : Oriented to Self, Oriented to Place, Oriented to  Time, Oriented to Situation Alcohol  / Substance Use: Not Applicable Psych Involvement: No (comment)  Admission diagnosis:  Thoracic spine fracture (HCC) [S22.009A] Traumatic pneumothorax, initial encounter [S27.0XXA] Closed fracture of thoracic vertebra, unspecified fracture morphology, unspecified thoracic vertebral level, initial encounter (HCC) [S22.009A] Closed fracture of cervical vertebra, unspecified cervical vertebral level, initial encounter (HCC) [S12.9XXA] Patient Active Problem List   Diagnosis Date Noted   Thoracic spine fracture (HCC) 06/15/2024   Anemia in stage 4 chronic kidney disease (HCC) 03/11/2023   VBI (vertebrobasilar insufficiency) 08/27/2021   Chronic renal insufficiency, stage 4 (severe) (HCC) 01/04/2021   Obesity (BMI 30-39.9) 07/05/2019   Arthritis 02/15/2016   Diabetes mellitus without complication (HCC) 02/15/2016   Benign essential hypertension 02/15/2016   Gout 02/15/2016   Mixed hyperlipidemia 02/15/2016   PCP:  Candise Aleene DEL, MD Pharmacy:   [facility]) - [place], [place] - [address] [facility] [address] [place] [address] [place] [address] Phone: [phone] Fax: [phone]  [facility] -  [place], [place] - [address] [place] [address] Phone: [phone] Fax: [phone]  [facility] [address], [place] - [address] [place] [address] [place] [address] Phone: [phone] Fax: [phone]  [facility] [address] [place] [address] Phone: [phone] Fax: [phone]     Social Drivers of  Health (SDOH) Social History: SDOH Screenings   Food Insecurity: No Food Insecurity (05/31/2024)  Housing: Unknown (05/31/2024)  Transportation Needs: No Transportation Needs (05/31/2024)  Depression (PHQ2-9): Medium Risk (03/08/2024)  Financial Resource Strain: Low Risk  (05/31/2024)  Physical Activity: Insufficiently Active (05/31/2024)  Social Connections: Moderately Isolated (05/31/2024)  Stress: No Stress Concern Present (05/31/2024)  Tobacco Use: Medium Risk (06/15/2024)   SDOH Interventions:     Readmission Risk Interventions     No data to display

## 2024-06-17 NOTE — Progress Notes (Signed)
 Neurosurgery  Patient seen an examined. Severe dysarthria. Full strength in upper extremities and lower extremities. C collar and TS so brace in place.  I had a long discussion with patient and his wife. His cervical fracture has good potential for healing on its own. I would recommend continuing the C collar for at least six weeks with a repeat x-ray at that time. His thoracic fracture, however, would heal best with internal fixation given his underlying DISH. We plan for a percutaneous instrumentation next Tuesday. The general technique of surgery, as well as risk, benefits, alternatives, and expected convalescence, were discussed. He and his wife wished to proceed.

## 2024-06-17 NOTE — Consult Note (Signed)
 WOC Nurse Consult Note:  WOC consult performed remotely utilizing images and record review, incidentally noted has a head wound related to trauma/fall  Reason for Consult:Buttock wound Wound type: deep tissue pressure injury to bilateral buttocks, head wound related to trauma Pressure Injury POA: Yes Measurement: see nursing flow sheets Wound bed: deep purple maroon discoloration to bilateral buttocks, open laceration to head  Drainage (amount, consistency, odor)  Buttocks: none Head: sanguinous Periwound: intact Dressing procedure/placement/frequency:  Cleanse head wound with NS, apply triple antibiotic ointment, leave open to air, will have difficulty with a cover dressing due to hair volume.  Cleanse buttocks, apply Xeroform and cover with silicone foam dressing.  Change daily.  WOC team will not follow at this time, please consult if new needs arise.  Thank you,  Doyal Polite, RN, MSN, [facility] WOC Team

## 2024-06-17 NOTE — Progress Notes (Signed)
 Notified that patient has a sacral/buttock wound by dietitian.  Patient's wife states he has had this for at least 2 years.  He has seen wound care for it.  It has remained this purple/red color that looks like an ecchymosis.  He does sleep in his lift chair.  Occasionally there will be breakdown about the size of my fingernail per the wife, but these generally heal.  There has been no other injury noted or really any significant change in 2 years.  There is an actual area of ecchymosis just above this on his lower back from his fall noted.   This wound is as noted in the picture.  It does not look like a typical pressure wound, especially one that has been present for 2 years, but the skin is intact.  It is a bit thinner over the more deep purple areas.  It is blanching.  It is not warm to touch or indurated c/w infection.  Will have WOC evaluate (see their note) for recommendations.  Dave Wright 1:28 PM 06/17/2024

## 2024-06-17 NOTE — Progress Notes (Signed)
 Speech Language Pathology Treatment: Dysphagia  Patient Details Name: Dave Wright MRN: [id number] DOB: Nov 17, 1941 Today's Date: 06/17/2024 Time: 1020-1040 SLP Time Calculation (min) (ACUTE ONLY): 20 min  Assessment / Plan / Recommendation Clinical Impression  SLP at bedside to review MBSS results and recommendations with pt and wife. Reviewed need to remain NPO and need for Cortrak for nutrition support and medication delivery. Likely will not be able to get Cortrak until tomorrow, 8/1. Okay for PO meds crushed in applesauce until Cortrak is placed.   Reviewed effortful swallow exercise with ice chips and encouraged ice chips for oral gratification and exercise. Will need Q4 oral care to reduce risk of aspiration pneumonia from reduced secretion management. Ice chips will likely aid secretion management.   Family verbalized understanding with all questions within SLP scope answered at this time. SLP will continue to follow for pharyngeal strengthening exercises and repeat MBSS in x5-7 days.   Recommend high intensity SLP services x5 days a week at next level of care.    HPI HPI: Pt is a 83 yo male s/p fall. CT(+) acute displaced C7 fx, non displaced C5-6, T10-11 vertical cleft fx with disc space involvement, Small L HPTX and L 7-9 rib fxs.  Planned thoracic fx sx next Tuesday. 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, prior CVA      SLP Plan  Continue with current plan of care (repeat MBSS in x5-7 days)          Recommendations  Diet recommendations: NPO Medication Administration: Crushed with puree (until Cortrak can be placed on Friday, 8/1) Supervision: Trained caregiver to feed patient;Staff to assist with self feeding;Full supervision/cueing for compensatory strategies Compensations: Slow rate                  Oral  care QID;Oral care prior to ice chip/H20   Frequent or constant Supervision/Assistance Dysphagia, pharyngeal phase (R13.13)     Continue with current plan of care (repeat MBSS in x5-7 days)     Ayani Ospina J Yolande Skoda  06/17/2024, 11:14 AM

## 2024-06-17 NOTE — Progress Notes (Signed)
 Progress Note     Subjective: Pt reports more pain in neck and back after moving around some this AM. Keeping brace on majority of the time since he is more comfortable sitting up some. Was able to do better with SLP yesterday once TLSO was delivered and they could assess sitting upright. MBS planned for this AM. UOP improving some. Increased O2 requirement overnight and some ongoing tachycardia and hypertension.   Objective: Vital signs in last 24 hours: Temp:  [97.7 F (36.5 C)-98.7 F (37.1 C)] 98.1 F (36.7 C) (07/31 0400) Pulse Rate:  [77-119] 93 (07/30 2246) Resp:  [10-23] 15 (07/31 0630) BP: (103-204)/(50-102) 143/81 (07/31 0600) SpO2:  [86 %-99 %] 91 % (07/31 0630)    Intake/Output from previous day: 07/30 0701 - 07/31 0700 In: 1920.8 [I.V.:1920.8] Out: 825 [Urine:825] Intake/Output this shift: No intake/output data recorded.  PE: General: pleasant, WD, WN male who is laying in bed in NAD Neck: cervical collar present  HEENT: small scalp laceration without active bleeding  Heart: sinus tachycardia in the 110s.  Normal s1,s2. No obvious murmurs, gallops, or rubs noted.  Palpable radial and pedal pulses bilaterally Lungs: CTAB, no wheezes, rhonchi, or rales noted.  Respiratory effort nonlabored on 6L Abd: soft, NT, ND, +BS, no masses, hernias, or organomegaly MS: all 4 extremities are symmetrical with no cyanosis, clubbing, or edema. TLSO present also  Skin: warm and dry with no masses, lesions, or rashes Neuro: Cranial nerves 2-12 grossly intact Psych: A&Ox3 with an appropriate affect.    Lab Results:  Recent Labs    06/16/24 0613 06/17/24 0208  WBC 6.8 5.7  HGB 9.1* 8.3*  HCT 28.3* 25.1*  PLT 116* 103*   BMET Recent Labs    06/16/24 0613 06/17/24 0208  NA 137 138  K 4.3 4.4  CL 111 109  CO2 20* 21*  GLUCOSE 174* 125*  BUN 38* 35*  CREATININE 2.05* 1.76*  CALCIUM  9.1 8.8*   PT/INR No results for input(s): LABPROT, INR in the last 72  hours. CMP     Component Value Date/Time   NA 138 06/17/2024 0208   NA 141 05/24/2024 0000   K 4.4 06/17/2024 0208   CL 109 06/17/2024 0208   CO2 21 (L) 06/17/2024 0208   GLUCOSE 125 (H) 06/17/2024 0208   BUN 35 (H) 06/17/2024 0208   BUN 41 (A) 05/24/2024 0000   CREATININE 1.76 (H) 06/17/2024 0208   CREATININE 2.14 (H) 05/26/2023 0958   CREATININE 2.03 (H) 08/16/2022 1415   CALCIUM  8.8 (L) 06/17/2024 0208   PROT 5.1 (L) 06/15/2024 1559   ALBUMIN 3.1 (L) 06/15/2024 1559   AST 70 (H) 06/15/2024 1559   AST 62 (H) 05/26/2023 0958   ALT 33 06/15/2024 1559   ALT 40 05/26/2023 0958   ALKPHOS 41 06/15/2024 1559   BILITOT 1.1 06/15/2024 1559   BILITOT 1.0 05/26/2023 0958   GFRNONAA 38 (L) 06/17/2024 0208   GFRNONAA 30 (L) 05/26/2023 0958   GFRAA 34 01/31/2020 0000   Lipase  No results found for: LIPASE     Studies/Results: DG CHEST PORT 1 VIEW Result Date: 06/16/2024 CLINICAL DATA:  [id number] Pneumothorax, left [id number]. EXAM: PORTABLE CHEST 1 VIEW COMPARISON:  06/15/2024. FINDINGS: Low lung volume. There is trace left apical pneumothorax. Bilateral lung fields are otherwise clear. No acute consolidation or lung collapse. Bilateral costophrenic angles are clear. Normal cardio-mediastinal silhouette. No acute osseous abnormalities. Partially seen right shoulder arthroplasty hardware. The soft tissues are within normal  limits. IMPRESSION: Trace left apical pneumothorax. Electronically Signed   By: Ree Molt M.D.   On: 06/16/2024 10:03   MR ANGIO ABDOMEN W CONTRAST Result Date: 06/16/2024 CLINICAL DATA:  Fall, perivascular edema EXAM: MRI ABDOMEN WITHOUT AND WITH CONTRAST TECHNIQUE: Multiplanar multisequence MR imaging of the abdomen was performed both before and after the administration of intravenous contrast. CONTRAST:  9mL GADAVIST  GADOBUTROL  1 MMOL/ML IV SOLN COMPARISON:  None Available. FINDINGS: Lower chest: No acute findings. Hepatobiliary: Significant respiratory motion  artifact. No intrahepatic mass or intrahepatic biliary ductal dilation on this limited examination. Cholelithiasis. Portal vein is patent. Left hepatic vein is atretic. Middle and right hepatic veins are patent. Narrowing of the intrahepatic inferior vena cava, however, this vessel remains patent. Pancreas:  Limited evaluation due to respiratory motion artifact. Spleen: Mild splenomegaly. No intrasplenic lesion identified. No parenchymal laceration. Trace perisplenic edema Adrenals/Urinary Tract: Adrenal glands are. Kidneys are normal in size and position. No hydronephrosis. No intrarenal mass. Exophytic simple cortical cyst arises from the lower pole the left kidney for which no follow-up imaging is recommended. Stomach/Bowel: Mild ascites. Evaluation of bowel is markedly limited by respiratory motion artifact. Vascular/Lymphatic: Variant anatomy with common origin of the celiac axis and superior mesenteric artery. Wide patency of the vessels without dissection or aneurysm. Renal arteries are widely patent and demonstrate normal vascular morphology. Inferior mesenteric artery is widely patent. Visualized lower extremity arterial inflow is widely patent. Iliofemoral venous structures are patent. Renal veins are patent bilaterally. Superior and inferior mesenteric veins are patent. Splenic vein is patent. Other:  None significant Musculoskeletal: Known thoracic spine fractures not well appreciated on this examination. Edema is seen within the disc space at T11-12 in keeping with known hyperextension injury at this level. IMPRESSION: 1. Significant respiratory motion artifact limits evaluation. The mesenteric and renal arterial structures appear widely patent evidence of dissection or aneurysm, however. Abdominal venous structures are patent. 2. Cholelithiasis. 3. Mild splenomegaly. 4. Mild ascites. 5. Known thoracic spine fractures not well appreciated on this examination. Edema is seen within the disc space at  T11-12 in keeping with known hyperextension injury at this level. Electronically Signed   By: Dorethia Molt M.D.   On: 06/16/2024 00:55   MR Cervical Spine Wo Contrast Result Date: 06/15/2024 CLINICAL DATA:  Neck trauma, ligament injury suspected (Age >= 16y); Back trauma, abnormal neuro exam, CT or xray positive (Age >= 16y) EXAM: MRI CERVICAL AND THORACIC SPINE WITHOUT CONTRAST TECHNIQUE: Multiplanar and multiecho pulse sequences of the cervical spine, to include the craniocervical junction and cervicothoracic junction, and the thoracic spine, were obtained without intravenous contrast. COMPARISON:  None Available. FINDINGS: MRI CERVICAL SPINE FINDINGS Alignment: Similar widening of the anterior C6-C7 disc space. No substantial sagittal subluxation. Vertebrae: Acute fracture through the anterior/superior C7 vertebral body with disruption of the adjacent anterior longitudinal ligament. Cord: Normal cord signal. Posterior Fossa, vertebral arteries, paraspinal tissues: Small volume of prevertebral edema extends from the site of fracture at C7 inferiorly into the upper thoracic spine. Mild posterior paraspinal edema, compatible with ligamentous strain. Edema in the C6-C7 disc space, compatible with inter spinous strain. Disc levels: Superimposed multilevel degenerative change with ligamentum flavum thickening at C6-C7 and small posterior disc osteophyte complex resulting and mild canal stenosis. There is trace (1-2 mm thick) posterior epidural hemorrhage extending from C4-C5 to C6-C7 (for example see series 11, image 9 and series 13, image 23). Resulting mild canal stenosis. MRI THORACIC SPINE FINDINGS Alignment: Widening of the T10-T11 disc space anteriorly.  No substantial sagittal subluxation. Vertebrae: Disruption of the anterior longitudinal ligament at T10-T11 with traumatic injury to the disc space with associated edema. Fracture plane extends through the T11 vertebral body and was further characterized on  same day CT of the chest/abdomen/pelvis. Cord:  Normal cord signal. Paraspinal and other soft tissues: Please see same day CT of the chest/abdomen/pelvis for intrathoracic findings. There is small volume of pre vertebral edema extending from the site of injury at T7-T11 superiorly to T8 inferiorly at T12. No significant posterior paraspinal edema. Disc levels: No high-grade canal or foraminal stenosis. IMPRESSION: MRI cervical spine: 1. Acute C7 anterior/superior fracture with adjacent disruption of the anterior longitudinal ligament and traumatic prevertebral edema. 2. Trace (1 to 2 mm thick) posterior epidural hemorrhage spanning from C4-C5 to C6-C7 with mild canal stenosis. 3. Posterior paraspinal and C6-C7 interspinous ligamentous strain. 4. C5-C6 osteophyte fracture better characterized on recent CT of the cervical spine. MRI thoracic spine: 1. Acute T11 fracture with disruption of the adjacent anterior longitudinal ligament, adjacent prevertebral edema, and traumatic T10-T11 disc injury. 2. No significant stenosis. Electronically Signed   By: Gilmore GORMAN Molt M.D.   On: 06/15/2024 22:02   MR THORACIC SPINE WO CONTRAST Result Date: 06/15/2024 CLINICAL DATA:  Neck trauma, ligament injury suspected (Age >= 16y); Back trauma, abnormal neuro exam, CT or xray positive (Age >= 16y) EXAM: MRI CERVICAL AND THORACIC SPINE WITHOUT CONTRAST TECHNIQUE: Multiplanar and multiecho pulse sequences of the cervical spine, to include the craniocervical junction and cervicothoracic junction, and the thoracic spine, were obtained without intravenous contrast. COMPARISON:  None Available. FINDINGS: MRI CERVICAL SPINE FINDINGS Alignment: Similar widening of the anterior C6-C7 disc space. No substantial sagittal subluxation. Vertebrae: Acute fracture through the anterior/superior C7 vertebral body with disruption of the adjacent anterior longitudinal ligament. Cord: Normal cord signal. Posterior Fossa, vertebral arteries,  paraspinal tissues: Small volume of prevertebral edema extends from the site of fracture at C7 inferiorly into the upper thoracic spine. Mild posterior paraspinal edema, compatible with ligamentous strain. Edema in the C6-C7 disc space, compatible with inter spinous strain. Disc levels: Superimposed multilevel degenerative change with ligamentum flavum thickening at C6-C7 and small posterior disc osteophyte complex resulting and mild canal stenosis. There is trace (1-2 mm thick) posterior epidural hemorrhage extending from C4-C5 to C6-C7 (for example see series 11, image 9 and series 13, image 23). Resulting mild canal stenosis. MRI THORACIC SPINE FINDINGS Alignment: Widening of the T10-T11 disc space anteriorly. No substantial sagittal subluxation. Vertebrae: Disruption of the anterior longitudinal ligament at T10-T11 with traumatic injury to the disc space with associated edema. Fracture plane extends through the T11 vertebral body and was further characterized on same day CT of the chest/abdomen/pelvis. Cord:  Normal cord signal. Paraspinal and other soft tissues: Please see same day CT of the chest/abdomen/pelvis for intrathoracic findings. There is small volume of pre vertebral edema extending from the site of injury at T7-T11 superiorly to T8 inferiorly at T12. No significant posterior paraspinal edema. Disc levels: No high-grade canal or foraminal stenosis. IMPRESSION: MRI cervical spine: 1. Acute C7 anterior/superior fracture with adjacent disruption of the anterior longitudinal ligament and traumatic prevertebral edema. 2. Trace (1 to 2 mm thick) posterior epidural hemorrhage spanning from C4-C5 to C6-C7 with mild canal stenosis. 3. Posterior paraspinal and C6-C7 interspinous ligamentous strain. 4. C5-C6 osteophyte fracture better characterized on recent CT of the cervical spine. MRI thoracic spine: 1. Acute T11 fracture with disruption of the adjacent anterior longitudinal ligament, adjacent prevertebral  edema,  and traumatic T10-T11 disc injury. 2. No significant stenosis. Electronically Signed   By: Gilmore GORMAN Molt M.D.   On: 06/15/2024 22:02   CT CHEST ABDOMEN PELVIS WO CONTRAST Addendum Date: 06/15/2024 ADDENDUM REPORT: 06/15/2024 20:47 ADDENDUM: There is an error in the above report. The fractured vertebral body is T11 and the disruption of the anterior longitudinal ligament with widening of the disc space is at T10-11. No other changes are made to this report. These results were called by telephone at the time of interpretation on 06/15/2024 at 8:46 pm to provider Charlyn, MD, who verbally acknowledged these results. Electronically Signed   By: Dorethia Molt M.D.   On: 06/15/2024 20:47   Result Date: 06/15/2024 CLINICAL DATA:  Blunt polytrauma EXAM: CT CHEST, ABDOMEN AND PELVIS WITHOUT CONTRAST TECHNIQUE: Multidetector CT imaging of the chest, abdomen and pelvis was performed following the standard protocol without IV contrast. RADIATION DOSE REDUCTION: This exam was performed according to the departmental dose-optimization program which includes automated exposure control, adjustment of the mA and/or kV according to patient size and/or use of iterative reconstruction technique. COMPARISON:  None Available. FINDINGS: CT CHEST FINDINGS Cardiovascular: Moderate left anterior descending coronary artery calcification. Global cardiac size within limits. No pericardial effusion. Central pulmonary arteries are of normal caliber. There is mild diffuse form dilation of the ascending aorta measuring 4.1 by cm in maximal diameter. Descending thoracic aorta is of normal caliber. Moderate atherosclerotic calcification within thoracic aorta. Mediastinum/Nodes: 2.2 cm left thyroid  nodule, better assessed on prior thyroid  sonogram of 05/16/2023 and biopsied on 07/28/2023. No follow-up imaging is recommended. No pathologic thoracic adenopathy. Esophagus is unremarkable. Lungs/Pleura: Small left hemo pneumothorax is  present with a small amount of hemorrhagic fluid and a small anterior gaseous component. No CT evidence of tension physiology. No pneumothorax or pleural effusion on the right. Bibasilar dependent atelectasis. Debris within the trachea and right mainstem bronchus noted. Musculoskeletal: There are acute, minimally displaced fractures of the left 7-9 ribs posteriorly. There is a vertical cleft fracture of the T12 vertebral body (70/6) as well as destruction of the anterior tongue tube ligament and widening of the T11-12 disc space seen anteriorly in keeping with a hyperextension injury. The posterior wall of the T12 vertebral body appears intact, as are the posterior elements at T11-12. No listhesis or facetal dislocation. Multiple healed right rib fractures are noted. Right total shoulder arthroplasty has been performed. CT ABDOMEN PELVIS FINDINGS Hepatobiliary: Cholelithiasis without superimposed evidence of acute cholecystitis. Mild perihepatic ascites. No definite parenchymal laceration identified on this noncontrast examination. No intra or extrahepatic biliary ductal dilation. 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 No superimposed acute peripancreatic inflammatory changes are identified. Spleen: There is trace perisplenic fluid identified (17/2) which is relatively low attenuation and likely represent simple ascites. No parenchymal lacerations clearly identified on this noncontrast examination. The spleen is in size. Adrenals/Urinary Tract: Adrenal glands are unremarkable. 8 cm simple cortical cyst is seen within the lower pole the left kidney for which no follow-up imaging is recommended. The kidneys are otherwise unremarkable. Bladder unremarkable. Stomach/Bowel: Stomach is within normal limits. Appendix appears normal. No evidence of bowel wall thickening, distention, or inflammatory changes. No free  intraperitoneal gas. Vascular/Lymphatic: There is infiltration retroperitoneum surrounding the origins of the superior mesenteric artery and celiac axis. A vascular injury is not clearly identified, however, evaluation is limited by noncontrast technique. Retroperitoneal edema, venous hemorrhage or retroperitoneal fibrosis could appear similarly. Reproductive: Prostate  is unremarkable. Other: No abdominal wall hernia Musculoskeletal: No acute bone abnormality involving the abdomen and pelvis. IMPRESSION: 1. Small left hemopneumothorax. No CT evidence of tension physiology. 2. Acute, minimally displaced fractures of the left 7-9 ribs posteriorly. 3. Vertical cleft fracture of the T12 vertebral body with disruption of the anterior longitudinal ligament and widening of the T11-12 disc space in keeping with a hyperextension injury. The posterior wall of the T12 vertebral body appears intact, as are the posterior elements at T11-12. No listhesis or facetal dislocation. 4. Infiltration of the retroperitoneum surrounding the origins of the superior mesenteric artery and celiac axis. A vascular injury is not clearly identified, however, evaluation is limited by noncontrast technique. Retroperitoneal edema, venous hemorrhage or retroperitoneal fibrosis could appear similarly. If there is clinical concern for vascular injury, CT arteriography is recommended for further evaluation. 5. Mild ascites, nonspecific. 6. Cholelithiasis 7. Moderate coronary artery calcification. 8. 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 Recommend annual imaging followup by CTA or MRA. This recommendation follows 2010 ACCF/AHA/AATS/ACR/ASA/SCA/SCAI/SIR/STS/SVM Guidelines for the Diagnosis and Management of Patients with Thoracic Aortic Disease. Circulation. 2010; 121: [id number]. Aortic aneurysm NOS (ICD10-I71.9) Aortic Atherosclerosis (ICD10-I70.0). Electronically Signed: By: Dorethia Molt M.D. On: 06/15/2024 20:39   CT  HEAD WO CONTRAST Result Date: 06/15/2024 CLINICAL DATA:  Provided history: Polytrauma, blunt. Head trauma, moderate/severe. EXAM: CT HEAD WITHOUT CONTRAST CT CERVICAL SPINE WITHOUT CONTRAST TECHNIQUE: Multidetector CT imaging of the head and cervical spine was performed following the standard protocol without intravenous contrast. Multiplanar CT image reconstructions of the cervical spine were also generated. RADIATION DOSE REDUCTION: This exam was performed according to the departmental dose-optimization program which includes automated exposure control, adjustment of the mA and/or kV according to patient size and/or use of iterative reconstruction technique. COMPARISON:  CT angiogram head/neck 07/28/2023. Thyroid  ultrasound 05/16/2023. FINDINGS: CT HEAD FINDINGS Brain: Mild generalized cerebral atrophy. 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 Background mild patchy and ill-defined hypoattenuation within the cerebral white matter, nonspecific but compatible with chronic small vessel ischemic disease. Known small chronic infarcts within the left cerebellar hemisphere. There is no acute intracranial hemorrhage. No demarcated cortical infarct. No extra-axial fluid collection. No evidence of an intracranial mass. No midline shift. Vascular: No hyperdense vessel.  Atherosclerotic calcification. Skull: No calvarial fracture or aggressive osseous lesion. Sinuses/Orbits: No mass or acute finding within the imaged orbits. Partial opacification of a posterior right ethmoid air cell. Other: Redemonstrated chronic nasal bone fracture deformities. Midline parietal scalp hematoma. CT CERVICAL SPINE FINDINGS Alignment: No significant spondylolisthesis. Widening of the C6-C7 intervertebral disc space anteriorly (to 6 mm). Skull base and vertebrae: The basion-dental and atlanto-dental intervals are maintained. Acute, displaced fracture through the anterosuperior corner of the C7 vertebral body (also  involving an adjacent C6-C7 ventral osteophyte) (for instance as seen on series 7, image 31). Nondisplaced acute fracture through a C5-C6 ventral osteophyte to the right (for instance as seen on series 7, image 25). Soft tissues and spinal canal: No prevertebral fluid or swelling. No visible canal hematoma. Known thyroid  nodules which were previously assessed with thyroid  ultrasound 05/16/2023. Additionally, one of the nodules within the left thyroid  lobe was previously biopsied. Please correlate with the findings on the prior thyroid  ultrasound of 05/16/2023 and with pathology from prior thyroid  nodule fine-needle aspiration. 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 Disc space narrowing is greatest at C5-C6 (moderate-to-advanced at this level). No appreciable  high-grade spinal canal stenosis. Multilevel bony neural foraminal narrowing. Multilevel ventral osteophytes, most prominent at C5-C6 and C6-C7. Upper chest: Incompletely imaged left apical pneumothorax (series 4, image 94). Other: Multiple stents within the left vertebral artery. Cervical spine CT impressions #1, #2 and #4 called by telephone at the time of interpretation on 06/15/2024 at 4:48 pm to provider [facility] , who verbally acknowledged these results. IMPRESSION: CT head: 1.  No evidence of an acute intracranial abnormality. 2. Midline parietal scalp hematoma. 3. Parenchymal atrophy, chronic small vessel ischemic disease and unchanged chronic infarcts, as described. 4. Minor right ethmoid sinus disease. CT cervical spine: 1. Acute, displaced fracture through the anterosuperior corner of the C7 vertebral body (also involving an adjacent C6-C7 ventral osteophyte). There is widening of the C6-C7 intervertebral disc space anteriorly (to 6 mm). A cervical spine MRI is recommended to assess for ligamentous injury, and to assess  for involvement of the C6-C7 disc space. 2. Acute nondisplaced fracture through a C5-C6 ventral osteophyte. 3. Cervical spondylosis as described. 4. Incompletely imaged left apical pneumothorax. Electronically Signed   By: Rockey Childs D.O.   On: 06/15/2024 16:56   CT CERVICAL SPINE WO CONTRAST Result Date: 06/15/2024 CLINICAL DATA:  Provided history: Polytrauma, blunt. Head trauma, moderate/severe. EXAM: CT HEAD WITHOUT CONTRAST CT CERVICAL SPINE WITHOUT CONTRAST TECHNIQUE: Multidetector CT imaging of the head and cervical spine was performed following the standard protocol without intravenous contrast. Multiplanar CT image reconstructions of the cervical spine were also generated. RADIATION DOSE REDUCTION: This exam was performed according to the departmental dose-optimization program which includes automated exposure control, adjustment of the mA and/or kV according to patient size and/or use of iterative reconstruction technique. COMPARISON:  CT angiogram head/neck 07/28/2023. Thyroid  ultrasound 05/16/2023. FINDINGS: CT HEAD FINDINGS Brain: Mild generalized cerebral atrophy. 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 Background mild patchy and ill-defined hypoattenuation within the cerebral white matter, nonspecific but compatible with chronic small vessel ischemic disease. Known small chronic infarcts within the left cerebellar hemisphere. There is no acute intracranial hemorrhage. No demarcated cortical infarct. No extra-axial fluid collection. No evidence of an intracranial mass. No midline shift. Vascular: No hyperdense vessel.  Atherosclerotic calcification. Skull: No calvarial fracture or aggressive osseous lesion. Sinuses/Orbits: No mass or acute finding within the imaged orbits. Partial opacification of a posterior right ethmoid air cell. Other: Redemonstrated chronic nasal bone fracture deformities. Midline parietal scalp hematoma. CT CERVICAL SPINE FINDINGS Alignment: No  significant spondylolisthesis. Widening of the C6-C7 intervertebral disc space anteriorly (to 6 mm). Skull base and vertebrae: The basion-dental and atlanto-dental intervals are maintained. Acute, displaced fracture through the anterosuperior corner of the C7 vertebral body (also involving an adjacent C6-C7 ventral osteophyte) (for instance as seen on series 7, image 31). Nondisplaced acute fracture through a C5-C6 ventral osteophyte to the right (for instance as seen on series 7, image 25). Soft tissues and spinal canal: No prevertebral fluid or swelling. No visible canal hematoma. Known thyroid  nodules which were previously assessed with thyroid  ultrasound 05/16/2023. Additionally, one of the nodules within the left thyroid  lobe was previously biopsied. Please correlate with the findings on the prior thyroid  ultrasound of 05/16/2023 and with pathology from prior thyroid  nodule fine-needle aspiration. 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 Disc space narrowing is greatest at C5-C6 (moderate-to-advanced at this level). No appreciable high-grade spinal canal stenosis. Multilevel bony neural foraminal narrowing. Multilevel ventral osteophytes, most prominent at C5-C6  and C6-C7. Upper chest: Incompletely imaged left apical pneumothorax (series 4, image 94). Other: Multiple stents within the left vertebral artery. Cervical spine CT impressions #1, #2 and #4 called by telephone at the time of interpretation on 06/15/2024 at 4:48 pm to provider [facility] , who verbally acknowledged these results. IMPRESSION: CT head: 1.  No evidence of an acute intracranial abnormality. 2. Midline parietal scalp hematoma. 3. Parenchymal atrophy, chronic small vessel ischemic disease and unchanged chronic infarcts, as described. 4. Minor right ethmoid sinus disease. CT cervical spine: 1. Acute, displaced  fracture through the anterosuperior corner of the C7 vertebral body (also involving an adjacent C6-C7 ventral osteophyte). There is widening of the C6-C7 intervertebral disc space anteriorly (to 6 mm). A cervical spine MRI is recommended to assess for ligamentous injury, and to assess for involvement of the C6-C7 disc space. 2. Acute nondisplaced fracture through a C5-C6 ventral osteophyte. 3. Cervical spondylosis as described. 4. Incompletely imaged left apical pneumothorax. Electronically Signed   By: Rockey Childs D.O.   On: 06/15/2024 16:56   DG Lumbar Spine Complete Result Date: 06/15/2024 CLINICAL DATA:  Fall. EXAM: LUMBAR SPINE - COMPLETE 4+ VIEW COMPARISON:  Lumbar spine radiograph dated 07/29/2018. FINDINGS: Five lumbar type vertebra. There is no acute fracture or subluxation of the lumbar spine. Multilevel degenerative changes with disc space narrowing and spurring. Lower lumbar facet arthropathy. Mild atherosclerotic calcification of the abdominal aorta. There is a 1 cm radiopaque focus in the right flank which may represent a kidney stone. IMPRESSION: 1. No acute fracture or subluxation. 2. Multilevel degenerative changes. 3. Possible right kidney stone. Electronically Signed   By: Vanetta Chou M.D.   On: 06/15/2024 16:41   DG Chest Port 1 View Result Date: 06/15/2024 CLINICAL DATA:  Fall. EXAM: PORTABLE CHEST 1 VIEW COMPARISON:  August 19, 2022. FINDINGS: The heart size and mediastinal contours are within normal limits. Right lung is clear. Probable minimal left apical pneumothorax is noted. The visualized skeletal structures are unremarkable. IMPRESSION: Probable minimal left apical pneumothorax. CT scan may be performed for further evaluation. Electronically Signed   By: Lynwood Landy Raddle M.D.   On: 06/15/2024 16:01    Anti-infectives: Anti-infectives (From admission, onward)    None        Assessment/Plan  Mechanical GLF   C7 anterosuperior corner VB fx with involvement of   C6-C7 ventral osteophyte and widening of the C6-C7 intervertebral disc space - NSGY c/s, Dr. Debby, continue collar Acute nondisplaced fracture through C5-C6 ventral osteophyte - NSGY c/s, Dr. Debby, continue collar T12 vertebral body fx with widening of T11-12 disc space - NSGY c/s, Dr. Debby, planning OR next week 8/5 after plavix  washout Occult L apical PTX - CXR showed persistent small apical L PTX, repeat CXR this AM L 7-9 rib fractures - multimodal pain control, IS, pulm toilet Haziness in retroperitoneum around SMA - MRA somewhat motion degraded but no clear vascular injury, abdominal exam is benign this AM Scalp laceration - small, local wound care Dysphagia - SLP following, MBS planned for this AM BPH CKD stage IIIb - UOP improving and Cr 1.7 from baseline of 2.0, decreased IVF to 50 cc/h and continue to monitor  T2DM - SSI Hepatic cirrhosis HTN - added scheduled lopressor  7/30 and increased to 25 mg BID this AM HLD Hx of CVA Gout  FEN: meds in applesauce, decreased IVF to 50cc/h, MBS today VTE: SCDs, LMWH, holding home plavix  for now ID: no current indication for abx  Dispo: TLSO and collar, MBS today. Increased lopressor . Repeat CXR. NS plans OR next week for T12 fx. Continue therapies     LOS: 2 days   I reviewed Consultant NS notes, last 24 h vitals and pain scores, last 48 h intake and output, last 24 h labs and trends, and last 24 h imaging results.  This care required moderate level of medical decision making.    Burnard JONELLE Louder, [facility] 06/17/2024, 7:51 AM Please see Amion for pager number during day hours 7:00am-4:30pm

## 2024-06-17 NOTE — Progress Notes (Signed)
 Initial Nutrition Assessment  DOCUMENTATION CODES:   Non-severe (moderate) malnutrition in context of chronic illness  INTERVENTION:   Plan for Cortrak tomorrow and initiation of TF. Discussed with Dr. Sebastian, RN and pt and his wife. All in agreement of plan  Tube Feeding Recommendations via Cortrak:  Osmolite 1.5 at 65 ml/hr Begin at 25 ml/hr, titrate by 10 mL q 8 hours until goal rate of 65 Pro-Source TF20 60 mL BID TF regimen provides 2032 kcals, 127 g of protein and 1264 mL of free water  Recommend addition of 100 mg thiamine once enteral access available  NUTRITION DIAGNOSIS:   Moderate Malnutrition related to chronic illness as evidenced by moderate muscle depletion, moderate fat depletion, severe muscle depletion.  GOAL:   Patient will meet greater than or equal to 90% of their needs   MONITOR:   TF tolerance, Diet advancement, Labs, Weight trends, Skin  REASON FOR ASSESSMENT:   Consult Enteral/tube feeding initiation and management  ASSESSMENT:   83 yo male admitted post mechanical GLF (fell backwards onto concrete floor) with C-Spine and T-spine fractures in addition to pneumothorax, L rib fracture. 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  C collar in place, per neurosurgery, cervical fracture has good potential to heal on its own with recommendations to continue C collar for at least 6 weeks.   Pt in bed with elevated HOB allowed as TLSO in place. Cortrak held yesterday. NPO, failed MBS this morning. Per wife, pt has been experiencing swallowing difficulties; noted pt coughing with swallowing for several months now. Pt also with declined oral intake. Pt has been taking protein supplement  like pure protein/premier protein a few times a week.   Of note, wife reports plan for US  thyroid  in a few  months as outpatient, pt with hx of thyroid  nodule (biopsy in 2024 benign)  Large bilateral buttock/sacral hematoma per RN skin assessment. Discussed with pt's wife who indicates this wound is not just hematoma; apparently pt has a chronic wound in this location, even seen by wound clinical. Wife reports that wound area looked good/improved recently but wife has not seen this area since pt had fall. Discussed with MD and RN. Plan to monitor this area, increased risk for further skin breakdown  UBW 240 pounds; +wt loss, more recent wt around 190 pounds. Unclear time frame of wt loss but may have occurred over several years, although may have worsened recently.  Pt verbalizes he has no muscle. 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 hat was excised and skin from his upper leg/buttock was used to replace the cheek tissue. Pt reports it took a while to get used to it being there as the tissue feels differently but does not believe it is current  Wife reports pt with hx of Iron Def anemia; has been treated with iron infusions in the past (seen by Dr Frank). Wife reports PCP is currently managing this, most recent labs from 7/07  Labs: BUN 35 Creatinine 1.76 Sodium 138 (wdl) Potassium 4.4 (wdl) Phosphorus 3.7 (  wdl) Magnesium  1.8 (wdl) CBGs 114-138  Meds:  Colace Lasix   SS novolog  LR at 75 ml/hr  NUTRITION - FOCUSED PHYSICAL EXAM:  Flowsheet Row Most Recent Value  Orbital Region Moderate depletion  Upper Arm Region Moderate depletion  Thoracic and Lumbar Region Unable to assess  Buccal Region Moderate depletion  Temple Region Severe depletion  Clavicle Bone Region Moderate depletion  Clavicle  and Acromion Bone Region Moderate depletion  Scapular Bone Region Moderate depletion  Dorsal Hand Mild depletion  Patellar Region Severe depletion  Anterior Thigh Region Severe depletion  Posterior Calf Region Severe depletion  Hair Reviewed  Eyes Reviewed  Mouth Other (Comment)  [edges of tongue sore]  Skin Other (Comment)  [bruising everywhere, s/p fall on blood thinners]  Nails Reviewed      Diet Order:   Diet Order             Diet NPO time specified Except for: Ice Chips  Diet effective now                   EDUCATION NEEDS:   Education needs have been addressed  Skin:  Skin Assessment: Skin Integrity Issues: Skin Integrity Issues:: DTI DTI: bilateral buttock/sacral area (images in chart)-chronic in nature per wife (previously documented as hematoma) Other: scalp laceration  Last BM:  PTA  Height:   Ht Readings from Last 1 Encounters:  06/15/24 6' (1.829 m)    Weight:   Wt Readings from Last 1 Encounters:  06/16/24 87.6 kg     BMI:  Body mass index is 26.19 kg/m.  Estimated Nutritional Needs:   Kcal:  1900-2100 kcals  Protein:  100-115 g  Fluid:  >/= 1.9 L   Betsey Finger MS, RDN, LDN, CNSC Registered Dietitian 3 Clinical Nutrition RD Inpatient Contact Info in Amion

## 2024-06-17 NOTE — Progress Notes (Signed)
 Physical Therapy Treatment Patient Details Name: Dave Wright MRN: [id number] DOB: 05/12/1941 Today's Date: 06/17/2024   History of Present Illness Pt is a 83 yo male s/p fall. CT(+) acute displaced C7 fx, non displaced C5-6, T10-11 vertical cleft fx with disc space involvement, Small L HPTX and L 7-9 rib fxs.  Planned thoracic fx sx next Tuesday. PMH arthritis, chroinc daily HA, chronic leukopenia,CVA, DM2, gout, hepatic cirrhosis, HTN, HLD, OA,squamous cell carcinoma, Back surg, R total shoulder sx.    PT Comments  Follow-up completed to assist pt back to bed for nursing staff to complete needed care - with further review for transfer techniques and precautions. Required min assist to stand from recliner and step pivot for balance and walker control.  TLSO adjusted appropriately for maximum support. Mod assist +2 to return to supine with log roll  technique - supine poorly tolerated therefore quickly elevated HOB with TLSO still on. No radicular symptoms throughout session. Tolerated modified chair position in bed, happy that he is swallowing better in this position. Will be pertinent to mobilize safely to avoid skin breakdown and other complications associated with immobility until surgery. Patient will continue to benefit from skilled physical therapy services to further improve independence with functional mobility.    If plan is discharge home, recommend the following: A little help with walking and/or transfers;A little help with bathing/dressing/bathroom;Assist for transportation;Help with stairs or ramp for entrance   Can travel by private vehicle        Equipment Recommendations  None recommended by PT    Recommendations for Other Services Rehab consult     Precautions / Restrictions Precautions Precautions: Back;Cervical Precaution Booklet Issued: No Recall of Precautions/Restrictions: Intact Precaution/Restrictions Comments: reviewed with pt and wife Required Braces or  Orthoses: Spinal Brace;Cervical Brace Cervical Brace: Hard collar;At all times Spinal Brace: Thoracolumbosacral orthotic;Applied in supine position (MD reports Supine don with pending surgery Tuesday 8/5. Per neurosurgery pt OOB with therapy only.) Restrictions Weight Bearing Restrictions Per Provider Order: No Other Position/Activity Restrictions: TLSO needs to be on to elevate HOB     Mobility  Bed Mobility Overal bed mobility: Needs Assistance Bed Mobility: Rolling, Sit to Sidelying Rolling: Mod assist, Used rails, +2 for physical assistance, +2 for safety/equipment Sidelying to sit: Mod assist     Sit to sidelying: Mod assist, +2 for physical assistance, +2 for safety/equipment General bed mobility comments: Reviewed techniques. Required mod assist to lower to side and support LEs into bed with +2 assist to roll. Increased pain with bed flat, quickly elevated HOB for comfort. TLSO in place.    Transfers Overall transfer level: Needs assistance Equipment used: Rolling walker (2 wheels) Transfers: Sit to/from Stand, Bed to chair/wheelchair/BSC Sit to Stand: Min assist   Step pivot transfers: Min assist       General transfer comment: Min assist for boost to stand from recliner, Slow to rise but stable with RW to steady upon standing, cues for upright chest when rising and lowering. Min assist for balance and RW control with step pivot to bed.    Ambulation/Gait Ambulation/Gait assistance: Min assist Gait Distance (Feet): 25 Feet Assistive device: Rolling walker (2 wheels) Gait Pattern/deviations: Step-through pattern, Decreased stride length, Trunk flexed Gait velocity: dec Gait velocity interpretation: <1.31 ft/sec, indicative of household ambulator   General Gait Details: Min assist for walker control with turns and backing up. LEs without buckling or overt LOB. Cues for upright posture.   Stairs  Wheelchair Mobility     Tilt Bed    Modified  Rankin (Stroke Patients Only)       Balance Overall balance assessment: Needs assistance Sitting-balance support: No upper extremity supported, Feet supported Sitting balance-Leahy Scale: Fair     Standing balance support: Bilateral upper extremity supported, During functional activity Standing balance-Leahy Scale: Poor Standing balance comment: relies on RW  and external support                            Communication Communication Communication: No apparent difficulties  Cognition Arousal: Alert Behavior During Therapy: WFL for tasks assessed/performed   PT - Cognitive impairments: No apparent impairments                         Following commands: Intact      Cueing Cueing Techniques: Verbal cues  Exercises      General Comments General comments (skin integrity, edema, etc.): VSS on 4L      Pertinent Vitals/Pain Pain Assessment Pain Assessment: Faces Faces Pain Scale: Hurts a little bit Pain Location: neck, back Pain Descriptors / Indicators: Sore Pain Intervention(s): Monitored during session, Repositioned, Limited activity within patient's tolerance    Home Living                          Prior Function            PT Goals (current goals can now be found in the care plan section) Acute Rehab PT Goals Patient Stated Goal: return home PT Goal Formulation: With patient/family Time For Goal Achievement: 06/30/24 Potential to Achieve Goals: Good Progress towards PT goals: Progressing toward goals    Frequency    Min 3X/week      PT Plan      Co-evaluation              AM-PAC PT 6 Clicks Mobility   Outcome Measure  Help needed turning from your back to your side while in a flat bed without using bedrails?: Total Help needed moving from lying on your back to sitting on the side of a flat bed without using bedrails?: Total Help needed moving to and from a bed to a chair (including a wheelchair)?: A  Little Help needed standing up from a chair using your arms (e.g., wheelchair or bedside chair)?: A Little Help needed to walk in hospital room?: A Little Help needed climbing 3-5 steps with a railing? : Total 6 Click Score: 12    End of Session Equipment Utilized During Treatment: Gait belt;Back brace;Cervical collar;Oxygen Activity Tolerance: Patient tolerated treatment well Patient left: with call bell/phone within reach;with nursing/sitter in room;with family/visitor present;in bed;with bed alarm set;with SCD's reapplied Nurse Communication: Mobility status;Precautions PT Visit Diagnosis: Unsteadiness on feet (R26.81);Other abnormalities of gait and mobility (R26.89);Muscle weakness (generalized) (M62.81);History of falling (Z91.81);Difficulty in walking, not elsewhere classified (R26.2);Pain Pain - part of body:  (back and neck)     Time: 8467-8453 PT Time Calculation (min) (ACUTE ONLY): 14 min  Charges:    $Gait Training: 8-22 mins $Therapeutic Activity: 8-22 mins PT General Charges $$ ACUTE PT VISIT: 1 Visit                     Leontine Roads, PT, DPT [facility]  Rehabilitation Services Physical Therapist Office: [phone] Website: [email or web address]    Leontine GORMAN Roads 06/17/2024, 4:24 PM

## 2024-06-18 ENCOUNTER — Inpatient Hospital Stay (HOSPITAL_COMMUNITY)

## 2024-06-18 DIAGNOSIS — E440 Moderate protein-calorie malnutrition: Secondary | ICD-10-CM | POA: Insufficient documentation

## 2024-06-18 LAB — CBC
HCT: 27.1 % — ABNORMAL LOW (ref 39.0–52.0)
Hemoglobin: 8.6 g/dL — ABNORMAL LOW (ref 13.0–17.0)
MCH: 32.5 pg (ref 26.0–34.0)
MCHC: 31.7 g/dL (ref 30.0–36.0)
MCV: 102.3 fL — ABNORMAL HIGH (ref 80.0–100.0)
Platelets: 123 K/uL — ABNORMAL LOW (ref 150–400)
RBC: 2.65 MIL/uL — ABNORMAL LOW (ref 4.22–5.81)
RDW: 15.9 % — ABNORMAL HIGH (ref 11.5–15.5)
WBC: 9.3 K/uL (ref 4.0–10.5)
nRBC: 0 % (ref 0.0–0.2)

## 2024-06-18 LAB — MAGNESIUM: Magnesium: 1.7 mg/dL (ref 1.7–2.4)

## 2024-06-18 LAB — BASIC METABOLIC PANEL WITH GFR
Anion gap: 8 (ref 5–15)
BUN: 40 mg/dL — ABNORMAL HIGH (ref 8–23)
CO2: 21 mmol/L — ABNORMAL LOW (ref 22–32)
Calcium: 9 mg/dL (ref 8.9–10.3)
Chloride: 112 mmol/L — ABNORMAL HIGH (ref 98–111)
Creatinine, Ser: 1.76 mg/dL — ABNORMAL HIGH (ref 0.61–1.24)
GFR, Estimated: 38 mL/min — ABNORMAL LOW (ref >60)
Glucose, Bld: 146 mg/dL — ABNORMAL HIGH (ref 70–99)
Potassium: 4.6 mmol/L (ref 3.5–5.1)
Sodium: 141 mmol/L (ref 135–145)

## 2024-06-18 LAB — GLUCOSE, CAPILLARY
Glucose-Capillary: 142 mg/dL — ABNORMAL HIGH (ref 70–99)
Glucose-Capillary: 143 mg/dL — ABNORMAL HIGH (ref 70–99)
Glucose-Capillary: 155 mg/dL — ABNORMAL HIGH (ref 70–99)
Glucose-Capillary: 239 mg/dL — ABNORMAL HIGH (ref 70–99)
Glucose-Capillary: 252 mg/dL — ABNORMAL HIGH (ref 70–99)
Glucose-Capillary: 262 mg/dL — ABNORMAL HIGH (ref 70–99)

## 2024-06-18 LAB — CK: Total CK: 257 U/L (ref 49–397)

## 2024-06-18 LAB — PHOSPHORUS: Phosphorus: 3.4 mg/dL (ref 2.5–4.6)

## 2024-06-18 MED ORDER — DOCUSATE SODIUM 50 MG/5ML PO LIQD
100.0000 mg | Freq: Two times a day (BID) | ORAL | Status: DC
  Administered 2024-06-18 – 2024-06-21 (×7): 100 mg
  Filled 2024-06-18 (×7): qty 10

## 2024-06-18 MED ORDER — ATORVASTATIN CALCIUM 10 MG PO TABS
10.0000 mg | ORAL_TABLET | Freq: Every day | ORAL | Status: DC
  Administered 2024-06-19 – 2024-06-25 (×7): 10 mg
  Filled 2024-06-18 (×7): qty 1

## 2024-06-18 MED ORDER — FUROSEMIDE 20 MG PO TABS: 20.0000 mg | ORAL_TABLET | ORAL | Status: DC

## 2024-06-18 MED ORDER — AMLODIPINE BESYLATE 5 MG PO TABS
5.0000 mg | ORAL_TABLET | Freq: Every day | ORAL | Status: DC
  Administered 2024-06-18: 5 mg via ORAL
  Filled 2024-06-18: qty 1

## 2024-06-18 MED ORDER — METOPROLOL TARTRATE 25 MG PO TABS
25.0000 mg | ORAL_TABLET | Freq: Two times a day (BID) | ORAL | Status: DC
  Administered 2024-06-19: 25 mg
  Filled 2024-06-18: qty 1

## 2024-06-18 MED ORDER — THIAMINE MONONITRATE 100 MG PO TABS
100.0000 mg | ORAL_TABLET | Freq: Every day | ORAL | Status: AC
  Administered 2024-06-18 – 2024-06-24 (×7): 100 mg
  Filled 2024-06-18 (×7): qty 1

## 2024-06-18 MED ORDER — OSMOLITE 1.5 CAL PO LIQD
1000.0000 mL | ORAL | Status: DC
  Administered 2024-06-18 – 2024-06-21 (×5): 1000 mL
  Filled 2024-06-18 (×2): qty 1000

## 2024-06-18 MED ORDER — FREE WATER
200.0000 mL | Freq: Four times a day (QID) | Status: DC
  Administered 2024-06-18 – 2024-06-19 (×5): 200 mL

## 2024-06-18 MED ORDER — PROSOURCE TF20 ENFIT COMPATIBL EN LIQD
60.0000 mL | Freq: Two times a day (BID) | ENTERAL | Status: DC
  Administered 2024-06-18 – 2024-06-25 (×14): 60 mL
  Filled 2024-06-18 (×14): qty 60

## 2024-06-18 MED ORDER — OXYCODONE HCL 5 MG PO TABS
5.0000 mg | ORAL_TABLET | ORAL | Status: DC | PRN
  Administered 2024-06-18 – 2024-06-25 (×3): 5 mg
  Filled 2024-06-18 (×5): qty 1

## 2024-06-18 MED ORDER — DOXAZOSIN MESYLATE 2 MG PO TABS
2.0000 mg | ORAL_TABLET | Freq: Every day | ORAL | Status: DC
  Administered 2024-06-18 – 2024-06-20 (×3): 2 mg
  Filled 2024-06-18 (×4): qty 1

## 2024-06-18 MED ORDER — AMLODIPINE BESYLATE 5 MG PO TABS
5.0000 mg | ORAL_TABLET | Freq: Every day | ORAL | Status: DC
  Filled 2024-06-18: qty 1

## 2024-06-18 MED ORDER — OXYCODONE HCL 5 MG PO TABS
2.5000 mg | ORAL_TABLET | ORAL | Status: DC | PRN
  Administered 2024-06-19: 2.5 mg
  Filled 2024-06-18 (×2): qty 1

## 2024-06-18 MED ORDER — POLYETHYLENE GLYCOL 3350 17 G PO PACK
17.0000 g | PACK | Freq: Every day | ORAL | Status: DC | PRN
  Administered 2024-06-18: 17 g
  Filled 2024-06-18: qty 1

## 2024-06-18 MED ORDER — ADULT MULTIVITAMIN W/MINERALS CH
1.0000 | ORAL_TABLET | Freq: Every day | ORAL | Status: DC
  Administered 2024-06-18 – 2024-06-25 (×8): 1
  Filled 2024-06-18 (×8): qty 1

## 2024-06-18 NOTE — Progress Notes (Signed)
 Physical Therapy Treatment Patient Details Name: Dave Wright MRN: [id number] DOB: [birth year] Today's Date: 06/18/2024   History of Present Illness Pt is a 83 yo male s/p fall. CT(+) acute displaced C7 fx, non displaced C5-6, T10-11 vertical cleft fx with disc space involvement, Small L HPTX and L 7-9 rib fxs.  Planned thoracic fx sx next Tuesday. PMH arthritis, chroinc daily HA, chronic leukopenia,CVA, DM2, gout, hepatic cirrhosis, HTN, HLD, OA,squamous cell carcinoma, Back surg, R total shoulder sx.    PT Comments  Pt has been slowly progressing towards goals. Limited session today due to lethargy. Pt currently is Max A for bed mobility, Mod to Max A for sit to stand and Min to Max A for step pivot transfer. Pt very lethargic throughout session. Due to pt current functional status, home set up and available assistance at home recommending skilled physical therapy services > 3 hours/day in order to address strength, balance and functional mobility to decrease risk for falls, injury, immobility, skin break down and re-hospitalization.      If plan is discharge home, recommend the following: A little help with walking and/or transfers;A little help with bathing/dressing/bathroom;Assist for transportation;Help with stairs or ramp for entrance     Equipment Recommendations  None recommended by PT       Precautions / Restrictions Precautions Precautions: Back;Cervical Precaution Booklet Issued: No Recall of Precautions/Restrictions: Intact Precaution/Restrictions Comments: reviewed with pt throughout session Required Braces or Orthoses: Spinal Brace;Cervical Brace Cervical Brace: Hard collar;At all times Spinal Brace: Thoracolumbosacral orthotic;Applied in supine position Restrictions Weight Bearing Restrictions Per Provider Order: No Other Position/Activity Restrictions: TLSO needs to be on to elevate HOB     Mobility  Bed Mobility Overal bed mobility: Needs Assistance Bed Mobility:  Rolling, Sit to Sidelying Rolling: Max assist Sidelying to sit: Max assist       General bed mobility comments: Reviewed techniques. Required Max A for rolling and trunk to midline/LE off EOB    Transfers Overall transfer level: Needs assistance Equipment used: Rolling walker (2 wheels) Transfers: Sit to/from Stand, Bed to chair/wheelchair/BSC Sit to Stand: Mod assist   Step pivot transfers: Mod assist, Min assist       General transfer comment: Mod A for sit to stand from EOB with multi modal cues for safe hand placement and Min to Mod A for step pivot transfer. Pt stopped responding half way through transfer, no following directions or answering questions. Assisted in to sitting with need for manual assist for hip flexion to sit in recliner. Pt then started calling out and stated he thought he was following despite loudly stating to pt that he was going to be sat down due to unresponsive. RN aware. Once sitting in recliner pt still very lethargic throughout entire session. Spouse states pt has been lethargic today.    Ambulation/Gait     General Gait Details: unable at this time due to level of arousal      Balance Overall balance assessment: Needs assistance Sitting-balance support: No upper extremity supported, Feet supported Sitting balance-Leahy Scale: Poor Sitting balance - Comments: Min A to sit EOB   Standing balance support: Bilateral upper extremity supported, During functional activity Standing balance-Leahy Scale: Poor Standing balance comment: relies on RW  and external support        Communication Communication Communication: No apparent difficulties  Cognition Arousal: Lethargic Behavior During Therapy: Flat affect   PT - Cognitive impairments: Difficult to assess Difficult to assess due to: Level of arousal  Following commands: Impaired Following commands impaired: Follows one step commands with increased time    Cueing Cueing Techniques:  Verbal cues     General Comments General comments (skin integrity, edema, etc.): sanguinous drainage on pillow case. O2 sats above 88% on 5L O2 via HFNC      Pertinent Vitals/Pain Pain Assessment Pain Assessment: Faces Faces Pain Scale: Hurts a little bit Pain Location: neck, back Pain Descriptors / Indicators: Sore Pain Intervention(s): Monitored during session, Limited activity within patient's tolerance, Premedicated before session     PT Goals (current goals can now be found in the care plan section) Acute Rehab PT Goals Patient Stated Goal: return home PT Goal Formulation: With patient/family Time For Goal Achievement: 06/30/24 Potential to Achieve Goals: Good Progress towards PT goals: Progressing toward goals    Frequency    Min 3X/week      PT Plan  Continue with current POC        AM-PAC PT 6 Clicks Mobility   Outcome Measure  Help needed turning from your back to your side while in a flat bed without using bedrails?: A Lot Help needed moving from lying on your back to sitting on the side of a flat bed without using bedrails?: A Lot Help needed moving to and from a bed to a chair (including a wheelchair)?: A Lot Help needed standing up from a chair using your arms (e.g., wheelchair or bedside chair)?: A Lot Help needed to walk in hospital room?: Total Help needed climbing 3-5 steps with a railing? : Total 6 Click Score: 10    End of Session Equipment Utilized During Treatment: Gait belt;Back brace;Cervical collar;Oxygen Activity Tolerance: Patient limited by lethargy Patient left: with call bell/phone within reach;with family/visitor present;in chair;with chair alarm set Nurse Communication: Mobility status;Precautions PT Visit Diagnosis: Unsteadiness on feet (R26.81);Other abnormalities of gait and mobility (R26.89);Muscle weakness (generalized) (M62.81);History of falling (Z91.81);Difficulty in walking, not elsewhere classified (R26.2);Pain Pain - part  of body:  (back and neck)     Time: 8944-8881 PT Time Calculation (min) (ACUTE ONLY): 23 min  Charges:    $Therapeutic Activity: 23-37 mins PT General Charges $$ ACUTE PT VISIT: 1 Visit                    Dave Wright, DPT, CLT  Acute Rehabilitation Services Office: [phone] (Secure chat preferred)    Dave Wright 06/18/2024, 11:36 AM

## 2024-06-18 NOTE — Progress Notes (Signed)
 Physical Therapy Treatment Patient Details Name: Dave Wright MRN: [id number] DOB: Jun 06, 1941 Today's Date: 06/18/2024   History of Present Illness Pt is a 83 yo male s/p fall. CT(+) acute displaced C7 fx, non displaced C5-6, T10-11 vertical cleft fx with disc space involvement, Small L HPTX and L 7-9 rib fxs.  Planned thoracic fx sx next Tuesday. PMH arthritis, chroinc daily HA, chronic leukopenia,CVA, DM2, gout, hepatic cirrhosis, HTN, HLD, OA,squamous cell carcinoma, Back surg, R total shoulder sx.    PT Comments  Pt is progressing slowly towards goals. Limited today due to arousal level. Did not attempt gait secondary to increased need for assist with transfers. Currently pt is Max A for transfers and bed mobility. Due to pt current functional status, home set up and available assistance at home recommending skilled physical therapy services > 3 hours/day in order to address strength, balance and functional mobility to decrease risk for falls, injury, immobility, skin break down and re-hospitalization.      If plan is discharge home, recommend the following: A little help with walking and/or transfers;A little help with bathing/dressing/bathroom;Assist for transportation;Help with stairs or ramp for entrance     Equipment Recommendations  None recommended by PT       Precautions / Restrictions Precautions Precautions: Back;Cervical Precaution Booklet Issued: No Recall of Precautions/Restrictions: Intact Precaution/Restrictions Comments: reviewed with pt throughout session Required Braces or Orthoses: Spinal Brace;Cervical Brace Cervical Brace: Hard collar;At all times Spinal Brace: Thoracolumbosacral orthotic;Applied in supine position Restrictions Weight Bearing Restrictions Per Provider Order: No Other Position/Activity Restrictions: TLSO needs to be on to elevate HOB     Mobility  Bed Mobility Overal bed mobility: Needs Assistance Bed Mobility: Rolling, Sidelying to  Sit Rolling: Max assist Sidelying to sit: Max assist     Sit to sidelying: +2 for physical assistance, +2 for safety/equipment, Max assist General bed mobility comments: Reviewed techniques. Required Max A for rolling and trunk lowered to the bed as well as assist for bil UE into the bed. Poor direction following despite mutli modal cues.    Transfers Overall transfer level: Needs assistance Equipment used: Rolling walker (2 wheels) Transfers: Sit to/from Stand, Bed to chair/wheelchair/BSC Sit to Stand: Max assist   Step pivot transfers: Mod assist       General transfer comment: Pt was Max A for sit to stand from EOB with multi modal cues for safe hand placement and Mod A for step pivot transfer. Pt with difficulty following directions this session despite multi modal cues for safety and sequencing.    Ambulation/Gait     General Gait Details: unable at this time due to level of arousal     Balance Overall balance assessment: Needs assistance Sitting-balance support: No upper extremity supported, Feet supported Sitting balance-Leahy Scale: Poor Sitting balance - Comments: Min A to sit EOB   Standing balance support: Bilateral upper extremity supported, During functional activity Standing balance-Leahy Scale: Poor Standing balance comment: relies on RW  and external support      Communication Communication Communication: No apparent difficulties  Cognition Arousal: Lethargic Behavior During Therapy: Flat affect   PT - Cognitive impairments: Difficult to assess Difficult to assess due to: Level of arousal     Following commands: Impaired Following commands impaired: Follows one step commands with increased time    Cueing Cueing Techniques: Verbal cues     General Comments General comments (skin integrity, edema, etc.): O2 sats remained above 88% on 5L O2 via HFNC  Pertinent Vitals/Pain Pain Assessment Pain Assessment: Faces Faces Pain Scale: Hurts  little more Pain Location: neck, back Pain Descriptors / Indicators: Sore Pain Intervention(s): Limited activity within patient's tolerance, Monitored during session     PT Goals (current goals can now be found in the care plan section) Acute Rehab PT Goals Patient Stated Goal: return home PT Goal Formulation: With patient/family Time For Goal Achievement: 06/30/24 Potential to Achieve Goals: Good Progress towards PT goals: Progressing toward goals    Frequency    Min 3X/week      PT Plan  Continue with current POC        AM-PAC PT 6 Clicks Mobility   Outcome Measure  Help needed turning from your back to your side while in a flat bed without using bedrails?: A Lot Help needed moving from lying on your back to sitting on the side of a flat bed without using bedrails?: A Lot Help needed moving to and from a bed to a chair (including a wheelchair)?: A Lot Help needed standing up from a chair using your arms (e.g., wheelchair or bedside chair)?: A Lot Help needed to walk in hospital room?: Total Help needed climbing 3-5 steps with a railing? : Total 6 Click Score: 10    End of Session Equipment Utilized During Treatment: Gait belt;Back brace;Cervical collar;Oxygen Activity Tolerance: Patient limited by lethargy Patient left: with call bell/phone within reach;with family/visitor present;in chair;with chair alarm set Nurse Communication: Mobility status;Precautions PT Visit Diagnosis: Unsteadiness on feet (R26.81);Other abnormalities of gait and mobility (R26.89);Muscle weakness (generalized) (M62.81);History of falling (Z91.81);Difficulty in walking, not elsewhere classified (R26.2);Pain Pain - part of body:  (back and neck)     Time: 8777-8763 PT Time Calculation (min) (ACUTE ONLY): 14 min  Charges:    $Therapeutic Activity: 8-22 mins PT General Charges $$ ACUTE PT VISIT: 1 Visit                     Dorothyann Maier, DPT, CLT  Acute Rehabilitation  Services Office: [phone] (Secure chat preferred)    Dorothyann VEAR Maier 06/18/2024, 1:09 PM

## 2024-06-18 NOTE — Progress Notes (Signed)
    Providing Compassionate, Quality Care - Together   NEUROSURGERY PROGRESS NOTE     S: No issues overnight.    O: EXAM:  BP (!) 131/49   Pulse (!) 115   Temp 99.6 F (37.6 C) (Oral)   Resp 17   Ht 6' (1.829 m)   Wt 87.6 kg   SpO2 93%   BMI 26.19 kg/m     Awake, alert, oriented  Speech fluent, appropriate  CNs grossly intact  BUE/BLE 5/5 SILTx4 TLSO and C collar in place   ASSESSMENT:  83 y.o. with   -C5-6, 6-7 anterior osteophyte fracture with small epidural hematoma  -T10-T11 vertical cleft fracture with disc space involvement     PLAN: -Continue C collar -Continue to hold Plavix  in preparation for OR 8/5 for fixation of thoracic fracture. -Continue TLSO when upright -Call w/ questions/concerns.   Camie Pickle, [facility]

## 2024-06-18 NOTE — Procedures (Signed)
 Cortrak  Person Inserting Tube:  Donal Mutton, RD Tube Type:  Cortrak - 43 inches Tube Size:  10 Tube Location:  Left nare Secured by: Bridle Technique Used to Measure Tube Placement:  Marking at nare/corner of mouth Cortrak Secured At:  64 cm Procedure Comments:     Cortrak Tube Team Note:  Consult received to place a Cortrak feeding tube. Pt with Cervical and Thoracic spinal fractures. in C-collar and TLSO, able to adjust HOB per orders.   X-ray is required to confirm placement, RN may begin using tube once tip location confirmed via xray.   If the tube becomes dislodged please keep the tube and contact the Cortrak team at [email or web address] for replacement.  If after hours and replacement cannot be delayed, place a NG tube and confirm placement with an abdominal x-ray.    Mutton Donal MS, RDN, LDN, CNSC Registered Dietitian 3 Clinical Nutrition RD Inpatient Contact Info in Amion

## 2024-06-18 NOTE — Progress Notes (Signed)
 Nutrition Follow-up  DOCUMENTATION CODES:   Non-severe (moderate) malnutrition in context of chronic illness  INTERVENTION:   Tube Feeding via Cortrak once placed:  Osmolite 1.5 at 65 ml/hr Begin at 25 ml/hr, titrate by 10 mL q 8 hours until goal rate of 65 Pro-Source TF20 60 mL BID TF regimen provides 2032 kcals, 127 g of protein and 1264 mL of free water  100 mg thiamine daily x 7 days  MVI with minerals daily  Monitor magnesium  and phosphorus daily x 4 occurrences, MD to replete as needed, as pt is at risk for refeeding syndrome given pt meets criteria for malnutrition.  200 ml free water every 6 hours Total free water: 2064 ml    NUTRITION DIAGNOSIS:   Moderate Malnutrition related to chronic illness as evidenced by moderate muscle depletion, moderate fat depletion, severe muscle depletion. Ongoing, being addressed with enteral nutrition   GOAL:   Patient will meet greater than or equal to 90% of their needs Progressing with cortrak placement and initiation of TF   MONITOR:   TF tolerance, Diet advancement, Labs, Weight trends, Skin  REASON FOR ASSESSMENT:   Consult Enteral/tube feeding initiation and management  ASSESSMENT:   83 yo male admitted post mechanical GLF (fell backwards onto concrete floor) with C-Spine and T-spine fractures in addition to pneumothorax, L rib fracture. 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  Spoke with RN, discussed plan to start low and go slow with TF advancement.   Per previous notes:  Noted plan for Thoracic Spine surgery  (internal fixation) on Tuesday, 7/5.  C collar in place, per neurosurgery, cervical fracture has good potential to heal on its own with recommendations to continue C collar for at least 6 weeks.   7/29 Admitted 7/30 Cortrak ordered but placement held 7/31  Failed MBS, NPO   Medications reviewed and include:  Colace, lasix  20 mg every other day, SSI every 4 hours LR @ 50 ml until free water started  Labs reviewed:  CBG's: 138-143 - NPO  Current weight: 87.6 kg Admission weight: 86.6 kg UBW: 109 kg Generalized non-pitting edema documented   Diet Order:   Diet Order             Diet NPO time specified Except for: Ice Chips  Diet effective now                   EDUCATION NEEDS:   Education needs have been addressed  Skin:  Skin Assessment: Skin Integrity Issues: Skin Integrity Issues:: DTI DTI: bilateral buttock/sacral area (images in chart)-chronic in nature per wife (previously documented as hematoma) Other: scalp laceration  Last BM:  PTA  Height:   Ht Readings from Last 1 Encounters:  06/15/24 6' (1.829 m)    Weight:   Wt Readings from Last 1 Encounters:  06/16/24 87.6 kg    BMI:  Body mass index is 26.19 kg/m.  Estimated Nutritional Needs:   Kcal:  1900-2100 kcals  Protein:  115-130 g  Fluid:  >/= 1.9 L  Jesseca Marsch P., RD, LDN, CNSC See AMiON for contact information
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
## 2024-06-18 NOTE — Progress Notes (Signed)
 Occupational Therapy Treatment Patient Details Name: Dave Wright MRN: [id number] DOB: [birth year] Today's Date: 06/18/2024   History of present illness Pt is a 83 yo male s/p fall. CT(+) acute displaced C7 fx, non displaced C5-6, T10-11 vertical cleft fx with disc space involvement, Small L HPTX and L 7-9 rib fxs.  Planned thoracic fx sx next Tuesday. PMH arthritis, chroinc daily HA, chronic leukopenia,CVA, DM2, gout, hepatic cirrhosis, HTN, HLD, OA,squamous cell carcinoma, Back surg, R total shoulder sx.   OT comments  Collaborated with nursing on removal of TLSO brace, skin assessment and placement of foam dressings to combat skin integrity concerns with TLSO brace. Pt limited by lethargy this PM, requiring Max A to roll side to side for tasks and repositioned for pressure relief. Educated on placing hospital gown around backside with next TLSO donning for OOB tasks to further reduce risk of skin breakdown. Will further assess post op with wife hopeful for pt to qualify for intensive rehab services to maximize overall independence.      If plan is discharge home, recommend the following:  A little help with walking and/or transfers;A lot of help with bathing/dressing/bathroom;Assistance with cooking/housework;Assist for transportation;Help with stairs or ramp for entrance   Equipment Recommendations  BSC/3in1    Recommendations for Other Services Rehab consult    Precautions / Restrictions Precautions Precautions: Back;Cervical Recall of Precautions/Restrictions: Intact Precaution/Restrictions Comments: cortrak Required Braces or Orthoses: Spinal Brace;Cervical Brace Cervical Brace: Hard collar;At all times Spinal Brace: Thoracolumbosacral orthotic;Applied in supine position Restrictions Weight Bearing Restrictions Per Provider Order: No Other Position/Activity Restrictions: TLSO on for any OOB activity and when HOB elevated       Mobility Bed Mobility Overal bed mobility:  Needs Assistance Bed Mobility: Rolling Rolling: Max assist         General bed mobility comments: Max A to roll side to side for TLSO brace removal and repositioning    Transfers                   General transfer comment: deferred d/t lethargy     Balance                                           ADL either performed or assessed with clinical judgement   ADL Overall ADL's : Needs assistance/impaired Eating/Feeding: NPO                                     General ADL Comments: Total A to doff TLSO bed level w/ nursing present to assess skin. foam pads placed on areas of irritation of brace w/ HOB lowered and tube feeds stopped to allow pt to rest/skin to breathe.    Extremity/Trunk Assessment Upper Extremity Assessment Upper Extremity Assessment: Right hand dominant;LUE deficits/detail LUE Deficits / Details: hx of L shoulder injury and sx years ago w/ shoulder flexion impairments   Lower Extremity Assessment Lower Extremity Assessment: Defer to PT evaluation        Vision   Vision Assessment?: No apparent visual deficits   Perception     Praxis     Communication Communication Communication: Impaired Factors Affecting Communication: Hearing impaired;Reduced clarity of speech   Cognition Arousal: Lethargic Behavior During Therapy: Flat affect Cognition: Difficult to assess Difficult to assess due to: Level of  arousal           OT - Cognition Comments: limited by lethargy, not answering all questions today.                 Following commands: Impaired Following commands impaired: Follows one step commands inconsistently, Follows one step commands with increased time      Cueing   Cueing Techniques: Verbal cues  Exercises      Shoulder Instructions       General Comments Wife at bedside    Pertinent Vitals/ Pain       Pain Assessment Pain Assessment: Faces Faces Pain Scale: Hurts even more Pain  Location: back, L shoulder (prior injury/sx) Pain Descriptors / Indicators: Grimacing, Sore Pain Intervention(s): Limited activity within patient's tolerance, Monitored during session, Repositioned  Home Living                                          Prior Functioning/Environment              Frequency  Min 2X/week        Progress Toward Goals  OT Goals(current goals can now be found in the care plan section)  Progress towards OT goals: OT to reassess next treatment  Acute Rehab OT Goals Patient Stated Goal: wife hopeful for improvements after surgery OT Goal Formulation: With patient/family Time For Goal Achievement: 06/30/24 Potential to Achieve Goals: Good ADL Goals Pt Will Perform Grooming: with supervision;sitting;standing Pt Will Perform Lower Body Dressing: with supervision;sit to/from stand;with adaptive equipment Pt Will Transfer to Toilet: with supervision;ambulating Pt Will Perform Toileting - Clothing Manipulation and hygiene: with supervision;sit to/from stand;with adaptive equipment Additional ADL Goal #1: Pt will complete bed mobility with supervision.  Plan      Co-evaluation                 AM-PAC OT 6 Clicks Daily Activity     Outcome Measure   Help from another person eating meals?: Total Help from another person taking care of personal grooming?: A Lot Help from another person toileting, which includes using toliet, bedpan, or urinal?: Total Help from another person bathing (including washing, rinsing, drying)?: A Lot Help from another person to put on and taking off regular upper body clothing?: A Lot Help from another person to put on and taking off regular lower body clothing?: Total 6 Click Score: 9    End of Session Equipment Utilized During Treatment: Oxygen  OT Visit Diagnosis: Other abnormalities of gait and mobility (R26.89);Muscle weakness (generalized) (M62.81);Pain;History of falling (Z91.81)    Activity Tolerance Patient limited by lethargy;Patient limited by fatigue   Patient Left in bed;with call bell/phone within reach;with family/visitor present   Nurse Communication Mobility status        Time: 8652-8590 OT Time Calculation (min): 22 min  Charges: OT General Charges $OT Visit: 1 Visit OT Treatments $Therapeutic Activity: 8-22 mins  Mliss NOVAK, OTR/L Acute Rehab Services Office: [phone]   Mliss Fish 06/18/2024, 2:33 PM

## 2024-06-18 DEATH — deceased

## 2024-06-19 ENCOUNTER — Inpatient Hospital Stay (HOSPITAL_COMMUNITY)

## 2024-06-19 ENCOUNTER — Inpatient Hospital Stay (HOSPITAL_COMMUNITY): Admitting: Certified Registered"

## 2024-06-19 DIAGNOSIS — A419 Sepsis, unspecified organism: Secondary | ICD-10-CM

## 2024-06-19 DIAGNOSIS — J9601 Acute respiratory failure with hypoxia: Secondary | ICD-10-CM

## 2024-06-19 DIAGNOSIS — N179 Acute kidney failure, unspecified: Secondary | ICD-10-CM

## 2024-06-19 DIAGNOSIS — N1832 Chronic kidney disease, stage 3b: Secondary | ICD-10-CM

## 2024-06-19 DIAGNOSIS — R1310 Dysphagia, unspecified: Secondary | ICD-10-CM

## 2024-06-19 DIAGNOSIS — R6521 Severe sepsis with septic shock: Secondary | ICD-10-CM | POA: Diagnosis not present

## 2024-06-19 DIAGNOSIS — J9602 Acute respiratory failure with hypercapnia: Secondary | ICD-10-CM

## 2024-06-19 DIAGNOSIS — D62 Acute posthemorrhagic anemia: Secondary | ICD-10-CM

## 2024-06-19 LAB — PHOSPHORUS: Phosphorus: 2.5 mg/dL (ref 2.5–4.6)

## 2024-06-19 LAB — POCT I-STAT 7, (LYTES, BLD GAS, ICA,H+H)
Acid-Base Excess: 1 mmol/L (ref 0.0–2.0)
Bicarbonate: 28.5 mmol/L — ABNORMAL HIGH (ref 20.0–28.0)
Calcium, Ion: 1.28 mmol/L (ref 1.15–1.40)
HCT: 20 % — ABNORMAL LOW (ref 39.0–52.0)
Hemoglobin: 6.8 g/dL — CL (ref 13.0–17.0)
O2 Saturation: 100 %
Patient temperature: 97.7
Potassium: 4.9 mmol/L (ref 3.5–5.1)
Sodium: 143 mmol/L (ref 135–145)
TCO2: 31 mmol/L (ref 22–32)
pCO2 arterial: 65.6 mmHg (ref 32–48)
pH, Arterial: 7.244 — ABNORMAL LOW (ref 7.35–7.45)
pO2, Arterial: 246 mmHg — ABNORMAL HIGH (ref 83–108)

## 2024-06-19 LAB — BASIC METABOLIC PANEL WITH GFR
Anion gap: 7 (ref 5–15)
BUN: 69 mg/dL — ABNORMAL HIGH (ref 8–23)
CO2: 20 mmol/L — ABNORMAL LOW (ref 22–32)
Calcium: 8.6 mg/dL — ABNORMAL LOW (ref 8.9–10.3)
Chloride: 113 mmol/L — ABNORMAL HIGH (ref 98–111)
Creatinine, Ser: 2.37 mg/dL — ABNORMAL HIGH (ref 0.61–1.24)
GFR, Estimated: 26 mL/min — ABNORMAL LOW (ref >60)
Glucose, Bld: 270 mg/dL — ABNORMAL HIGH (ref 70–99)
Potassium: 4.4 mmol/L (ref 3.5–5.1)
Sodium: 140 mmol/L (ref 135–145)

## 2024-06-19 LAB — URINALYSIS, ROUTINE W REFLEX MICROSCOPIC
Bacteria, UA: NONE SEEN
Bilirubin Urine: NEGATIVE
Glucose, UA: >=500 mg/dL — AB
Hgb urine dipstick: NEGATIVE
Ketones, ur: NEGATIVE mg/dL
Leukocytes,Ua: NEGATIVE
Nitrite: NEGATIVE
Protein, ur: NEGATIVE mg/dL
Specific Gravity, Urine: 1.01 (ref 1.005–1.030)
pH: 5 (ref 5.0–8.0)

## 2024-06-19 LAB — CBC
HCT: 22.2 % — ABNORMAL LOW (ref 39.0–52.0)
HCT: 28.9 % — ABNORMAL LOW (ref 39.0–52.0)
Hemoglobin: 7.1 g/dL — ABNORMAL LOW (ref 13.0–17.0)
Hemoglobin: 8.8 g/dL — ABNORMAL LOW (ref 13.0–17.0)
MCH: 32.7 pg (ref 26.0–34.0)
MCH: 33 pg (ref 26.0–34.0)
MCHC: 32 g/dL (ref 30.0–36.0)
MCHC: 30.4 g/dL (ref 30.0–36.0)
MCV: 102.3 fL — ABNORMAL HIGH (ref 80.0–100.0)
MCV: 108.2 fL — ABNORMAL HIGH (ref 80.0–100.0)
Platelets: 90 K/uL — ABNORMAL LOW (ref 150–400)
Platelets: 162 K/uL (ref 150–400)
RBC: 2.17 MIL/uL — ABNORMAL LOW (ref 4.22–5.81)
RBC: 2.67 MIL/uL — ABNORMAL LOW (ref 4.22–5.81)
RDW: 16 % — ABNORMAL HIGH (ref 11.5–15.5)
RDW: 16.3 % — ABNORMAL HIGH (ref 11.5–15.5)
WBC: 14.6 K/uL — ABNORMAL HIGH (ref 4.0–10.5)
WBC: 37 K/uL — ABNORMAL HIGH (ref 4.0–10.5)
nRBC: 0 % (ref 0.0–0.2)
nRBC: 0 % (ref 0.0–0.2)

## 2024-06-19 LAB — GLUCOSE, CAPILLARY
Glucose-Capillary: 218 mg/dL — ABNORMAL HIGH (ref 70–99)
Glucose-Capillary: 235 mg/dL — ABNORMAL HIGH (ref 70–99)
Glucose-Capillary: 259 mg/dL — ABNORMAL HIGH (ref 70–99)
Glucose-Capillary: 270 mg/dL — ABNORMAL HIGH (ref 70–99)
Glucose-Capillary: 309 mg/dL — ABNORMAL HIGH (ref 70–99)
Glucose-Capillary: 316 mg/dL — ABNORMAL HIGH (ref 70–99)
Glucose-Capillary: 338 mg/dL — ABNORMAL HIGH (ref 70–99)

## 2024-06-19 LAB — ABO/RH: ABO/RH(D): O POS

## 2024-06-19 LAB — PREPARE RBC (CROSSMATCH)

## 2024-06-19 LAB — MAGNESIUM: Magnesium: 1.8 mg/dL (ref 1.7–2.4)

## 2024-06-19 MED ORDER — FUROSEMIDE 10 MG/ML IJ SOLN
40.0000 mg | Freq: Once | INTRAMUSCULAR | Status: AC
  Administered 2024-06-19: 40 mg via INTRAVENOUS
  Filled 2024-06-19: qty 4

## 2024-06-19 MED ORDER — SODIUM CHLORIDE 0.9% IV SOLUTION: Freq: Once | INTRAVENOUS | Status: AC

## 2024-06-19 MED ORDER — FENTANYL CITRATE PF 50 MCG/ML IJ SOSY: 25.0000 ug | PREFILLED_SYRINGE | Freq: Once | INTRAMUSCULAR | Status: AC

## 2024-06-19 MED ORDER — ORAL CARE MOUTH RINSE: 15.0000 mL | OROMUCOSAL | Status: DC | PRN

## 2024-06-19 MED ORDER — IPRATROPIUM-ALBUTEROL 0.5-2.5 (3) MG/3ML IN SOLN
3.0000 mL | Freq: Four times a day (QID) | RESPIRATORY_TRACT | Status: DC
  Administered 2024-06-19 – 2024-06-21 (×8): 3 mL via RESPIRATORY_TRACT
  Filled 2024-06-19 (×8): qty 3

## 2024-06-19 MED ORDER — CALCIUM CHLORIDE 10 % IV SOLN
INTRAVENOUS | Status: AC
  Filled 2024-06-19: qty 10

## 2024-06-19 MED ORDER — SODIUM BICARBONATE 8.4 % IV SOLN: 100.0000 meq | Freq: Once | INTRAVENOUS | Status: AC

## 2024-06-19 MED ORDER — ATROPINE SULFATE 1 MG/10ML IJ SOSY
PREFILLED_SYRINGE | INTRAMUSCULAR | Status: AC
  Administered 2024-06-19: 1 mg
  Filled 2024-06-19: qty 10

## 2024-06-19 MED ORDER — ROCURONIUM BROMIDE 10 MG/ML (PF) SYRINGE: 100.0000 mg | PREFILLED_SYRINGE | INTRAVENOUS | Status: AC

## 2024-06-19 MED ORDER — VASOPRESSIN 20 UNITS/100 ML INFUSION FOR SHOCK
0.0000 [IU]/min | INTRAVENOUS | Status: DC
  Administered 2024-06-19: 0.03 [IU]/min via INTRAVENOUS
  Filled 2024-06-19: qty 100

## 2024-06-19 MED ORDER — PROPOFOL 1000 MG/100ML IV EMUL
0.0000 ug/kg/min | INTRAVENOUS | Status: DC
  Filled 2024-06-19 (×4): qty 100

## 2024-06-19 MED ORDER — SODIUM BICARBONATE 8.4 % IV SOLN
INTRAVENOUS | Status: AC
  Administered 2024-06-19: 100 meq via INTRAVENOUS
  Filled 2024-06-19: qty 100

## 2024-06-19 MED ORDER — DOCUSATE SODIUM 50 MG/5ML PO LIQD: 100.0000 mg | Freq: Two times a day (BID) | ORAL | Status: DC

## 2024-06-19 MED ORDER — MIDAZOLAM HCL 2 MG/2ML IJ SOLN
2.0000 mg | Freq: Once | INTRAMUSCULAR | Status: AC
  Filled 2024-06-19: qty 2

## 2024-06-19 MED ORDER — LACTATED RINGERS IV SOLN: INTRAVENOUS | Status: AC

## 2024-06-19 MED ORDER — PROPOFOL 1000 MG/100ML IV EMUL
0.0000 ug/kg/min | INTRAVENOUS | Status: DC
  Administered 2024-06-19: 15 ug/kg/min via INTRAVENOUS
  Administered 2024-06-19 – 2024-06-20 (×3): 25 ug/kg/min via INTRAVENOUS
  Administered 2024-06-20: 30 ug/kg/min via INTRAVENOUS
  Administered 2024-06-20 – 2024-06-21 (×2): 25 ug/kg/min via INTRAVENOUS
  Filled 2024-06-19 (×3): qty 100

## 2024-06-19 MED ORDER — INSULIN ASPART 100 UNIT/ML IJ SOLN
0.0000 [IU] | INTRAMUSCULAR | Status: DC
  Administered 2024-06-19: 8 [IU] via SUBCUTANEOUS
  Administered 2024-06-19 (×2): 11 [IU] via SUBCUTANEOUS
  Administered 2024-06-20: 3 [IU] via SUBCUTANEOUS
  Administered 2024-06-20: 11 [IU] via SUBCUTANEOUS
  Administered 2024-06-20 (×2): 5 [IU] via SUBCUTANEOUS
  Administered 2024-06-20 – 2024-06-21 (×4): 8 [IU] via SUBCUTANEOUS
  Administered 2024-06-21 (×2): 11 [IU] via SUBCUTANEOUS

## 2024-06-19 MED ORDER — ORAL CARE MOUTH RINSE
15.0000 mL | OROMUCOSAL | Status: DC
  Administered 2024-06-20 – 2024-06-24 (×56): 15 mL via OROMUCOSAL

## 2024-06-19 MED ORDER — POTASSIUM CHLORIDE CRYS ER 20 MEQ PO TBCR
40.0000 meq | EXTENDED_RELEASE_TABLET | Freq: Once | ORAL | Status: DC
  Filled 2024-06-19: qty 2

## 2024-06-19 MED ORDER — NOREPINEPHRINE 4 MG/250ML-% IV SOLN
INTRAVENOUS | Status: AC
  Administered 2024-06-19: 9 ug/min via INTRAVENOUS
  Filled 2024-06-19: qty 250

## 2024-06-19 MED ORDER — LACTATED RINGERS IV BOLUS
500.0000 mL | Freq: Once | INTRAVENOUS | Status: AC
  Administered 2024-06-19: 500 mL via INTRAVENOUS

## 2024-06-19 MED ORDER — FENTANYL 2500MCG IN NS 250ML (10MCG/ML) PREMIX INFUSION
0.0000 ug/h | INTRAVENOUS | Status: DC
  Administered 2024-06-19: 25 ug/h via INTRAVENOUS
  Administered 2024-06-21: 75 ug/h via INTRAVENOUS
  Administered 2024-06-22: 150 ug/h via INTRAVENOUS
  Administered 2024-06-22: 100 ug/h via INTRAVENOUS
  Filled 2024-06-19 (×4): qty 250

## 2024-06-19 MED ORDER — FENTANYL BOLUS VIA INFUSION
25.0000 ug | INTRAVENOUS | Status: DC | PRN
  Administered 2024-06-19 (×2): 50 ug via INTRAVENOUS
  Administered 2024-06-20 – 2024-06-21 (×5): 100 ug via INTRAVENOUS
  Administered 2024-06-22: 75 ug via INTRAVENOUS
  Administered 2024-06-22 (×2): 100 ug via INTRAVENOUS
  Administered 2024-06-22: 50 ug via INTRAVENOUS

## 2024-06-19 MED ORDER — MAGNESIUM SULFATE 2 GM/50ML IV SOLN
2.0000 g | Freq: Once | INTRAVENOUS | Status: AC
  Administered 2024-06-19: 2 g via INTRAVENOUS
  Filled 2024-06-19: qty 50

## 2024-06-19 MED ORDER — ACETAMINOPHEN 160 MG/5ML PO SOLN
650.0000 mg | ORAL | Status: DC | PRN
  Administered 2024-06-19 – 2024-06-25 (×3): 650 mg
  Filled 2024-06-19 (×3): qty 20.3

## 2024-06-19 MED ORDER — FENTANYL CITRATE PF 50 MCG/ML IJ SOSY
50.0000 ug | PREFILLED_SYRINGE | Freq: Once | INTRAMUSCULAR | Status: DC
  Filled 2024-06-19: qty 1

## 2024-06-19 MED ORDER — PANTOPRAZOLE SODIUM 40 MG IV SOLR
40.0000 mg | INTRAVENOUS | Status: DC
  Administered 2024-06-19 – 2024-06-24 (×6): 40 mg via INTRAVENOUS
  Filled 2024-06-19 (×6): qty 10

## 2024-06-19 MED ORDER — SODIUM CHLORIDE 0.9 % IV SOLN: INTRAVENOUS | Status: AC | PRN

## 2024-06-19 MED ORDER — POLYETHYLENE GLYCOL 3350 17 G PO PACK
17.0000 g | PACK | Freq: Every day | ORAL | Status: DC
  Administered 2024-06-20: 17 g
  Filled 2024-06-19: qty 1

## 2024-06-19 MED ORDER — NOREPINEPHRINE 4 MG/250ML-% IV SOLN
0.0000 ug/min | INTRAVENOUS | Status: DC
  Administered 2024-06-20: 5 ug/min via INTRAVENOUS
  Administered 2024-06-20: 7 ug/min via INTRAVENOUS
  Administered 2024-06-21: 5 ug/min via INTRAVENOUS
  Administered 2024-06-21 (×2): 15 ug/min via INTRAVENOUS
  Administered 2024-06-21: 11 ug/min via INTRAVENOUS
  Filled 2024-06-19 (×7): qty 250

## 2024-06-19 MED ORDER — MIDAZOLAM HCL 2 MG/2ML IJ SOLN
INTRAMUSCULAR | Status: AC
  Administered 2024-06-19: 2 mg via INTRAVENOUS
  Filled 2024-06-19: qty 2

## 2024-06-19 MED ORDER — POTASSIUM CHLORIDE 20 MEQ PO PACK
40.0000 meq | PACK | Freq: Once | ORAL | Status: AC
  Administered 2024-06-19: 40 meq
  Filled 2024-06-19: qty 2

## 2024-06-19 MED ORDER — FENTANYL CITRATE PF 50 MCG/ML IJ SOSY
PREFILLED_SYRINGE | INTRAMUSCULAR | Status: AC
  Administered 2024-06-19: 50 ug via INTRAVENOUS
  Filled 2024-06-19: qty 2

## 2024-06-19 MED ORDER — SODIUM BICARBONATE 8.4 % IV SOLN
INTRAVENOUS | Status: AC
  Filled 2024-06-19: qty 50

## 2024-06-19 MED ORDER — PIPERACILLIN-TAZOBACTAM 3.375 G IVPB
3.3750 g | Freq: Three times a day (TID) | INTRAVENOUS | Status: DC
  Administered 2024-06-19 – 2024-06-23 (×12): 3.375 g via INTRAVENOUS
  Filled 2024-06-19 (×12): qty 50

## 2024-06-19 MED ORDER — ROCURONIUM BROMIDE 10 MG/ML (PF) SYRINGE
PREFILLED_SYRINGE | INTRAVENOUS | Status: AC
  Administered 2024-06-19: 100 mg via INTRAVENOUS
  Filled 2024-06-19: qty 10

## 2024-06-19 NOTE — Procedures (Signed)
 Intraosseous Needle Insertion Procedure Note    Date:06/19/24  Time:3:48 PM   Provider Performing:Mattisyn Cardona   Procedure: Insertion Intraosseous (63319)  Indication(s) Medication administration  Consent Unable to obtain consent due to emergent nature of procedure.  Anesthesia Topical only with 1% lidocaine    Timeout Verified patient identification, verified procedure, site/side was marked, verified correct patient position, special equipment/implants available, medications/allergies/relevant history reviewed, required imaging and test results available.  Procedure Description Area of needle insertion was cleaned with chlorhexidine . Intraosseous needle was placed into the left tibia. Bone marrow was aspirated and site easily flushed. The needle was secured in place and dressing applied.  Complications/Tolerance None; patient tolerated the procedure well.  EBL Minimal
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
## 2024-06-19 NOTE — Anesthesia Procedure Notes (Signed)
 Procedure Name: Intubation Date/Time: 06/19/2024 3:15 PM  Performed by: Bonny Avelina Caldron, CRNAPre-anesthesia Checklist: Patient identified, Emergency Drugs available, Suction available and Patient being monitored Preoxygenation: Pre-oxygenation with 100% oxygen Ventilation: Mask ventilation without difficulty Laryngoscope Size: Glidescope and 3 Grade View: Grade I Tube type: Oral Tube size: 8.0 mm Number of attempts: 1 Airway Equipment and Method: Stylet (Patient c-collar remains in place, c-spine immobilized during intubation via glidescope.) Placement Confirmation: ETT inserted through vocal cords under direct vision, positive ETCO2, breath sounds checked- equal and bilateral and CO2 detector Secured at: 24 cm Tube secured with: Tape Dental Injury: Teeth and Oropharynx as per pre-operative assessment  Comments: Called to 2H bed 1 for intubation. Patient in c-collar on our arrival. Being ventilated per RT with 100% FiO2 via face mask. Attempted look without sedation, cords visualized and clear, intubated without event.

## 2024-06-19 NOTE — Progress Notes (Signed)
 Progress Note     Subjective: Increased oxygen requirements overnight, NRB on this AM. CXR with R effusion. Talked to wife at bedside and she reports he would want to be intubated and on the vent if needed.   Objective: Vital signs in last 24 hours: Temp:  [97.7 F (36.5 C)-102.7 F (39.3 C)] 97.7 F (36.5 C) (08/02 0749) Pulse Rate:  [80] 80 (08/02 0934) Resp:  [12-31] 22 (08/02 0637) BP: (90-146)/(34-69) 146/48 (08/02 0934) SpO2:  [88 %-97 %] 97 % (08/02 0637) Weight:  [89.3 kg] 89.3 kg (08/02 0427)    Intake/Output from previous day: 08/01 0701 - 08/02 0700 In: 1587.6 [I.V.:252.6; NG/GT:1335] Out: 750 [Urine:750] Intake/Output this shift: No intake/output data recorded.  PE: General: pleasant, elderly male who is laying in bed  Neck: cervical collar present  HEENT: small scalp laceration without active bleeding  Heart: RRR in the 80s  Lungs: diminished in right base, O2 saturation 100% on 15L via NRB Abd: soft, NT, ND, +BS MS: MAEs, trace LE edema BL Skin: warm and dry with some scattered ecchymosis and skin tears on his upper extremities that are covered Neuro: no gross deficits    Lab Results:  Recent Labs    06/18/24 0219 06/19/24 0236  WBC 9.3 14.6*  HGB 8.6* 7.1*  HCT 27.1* 22.2*  PLT 123* 90*   BMET Recent Labs    06/18/24 0219 06/19/24 0236  NA 141 140  K 4.6 4.4  CL 112* 113*  CO2 21* 20*  GLUCOSE 146* 270*  BUN 40* 69*  CREATININE 1.76* 2.37*  CALCIUM  9.0 8.6*   PT/INR No results for input(s): LABPROT, INR in the last 72 hours. CMP     Component Value Date/Time   NA 140 06/19/2024 0236   NA 141 05/24/2024 0000   K 4.4 06/19/2024 0236   CL 113 (H) 06/19/2024 0236   CO2 20 (L) 06/19/2024 0236   GLUCOSE 270 (H) 06/19/2024 0236   BUN 69 (H) 06/19/2024 0236   BUN 41 (A) 05/24/2024 0000   CREATININE 2.37 (H) 06/19/2024 0236   CREATININE 2.14 (H) 05/26/2023 0958   CREATININE 2.03 (H) 08/16/2022 1415   CALCIUM  8.6 (L)  06/19/2024 0236   PROT 5.1 (L) 06/15/2024 1559   ALBUMIN 3.1 (L) 06/15/2024 1559   AST 70 (H) 06/15/2024 1559   AST 62 (H) 05/26/2023 0958   ALT 33 06/15/2024 1559   ALT 40 05/26/2023 0958   ALKPHOS 41 06/15/2024 1559   BILITOT 1.1 06/15/2024 1559   BILITOT 1.0 05/26/2023 0958   GFRNONAA 26 (L) 06/19/2024 0236   GFRNONAA 30 (L) 05/26/2023 0958   GFRAA 34 01/31/2020 0000   Lipase  No results found for: LIPASE     Studies/Results: DG Chest Portable 1 View Result Date: 06/19/2024 EXAM: 1 VIEW XRAY OF THE CHEST 06/19/2024 05:02:22 AM COMPARISON: 06/18/2024 CLINICAL HISTORY: Wet lung sounds, r/o PNA. FINDINGS: LUNGS AND PLEURA: Progressive opacification over the right lower lobe with increased blunting of the right costophrenic angle compatible with enlarging pleural effusion. Bibasilar atelectasis is similar to previous exam. HEART AND MEDIASTINUM: Aortic atherosclerotic calcification. Asymmetric elevation of the right hemidiaphragm. BONES AND SOFT TISSUES: No acute osseous abnormality. LINES AND TUBES: There is a feeding tube with the tip below the GE junction. Contrast material is identified within the imaged portions of the colon. IMPRESSION: 1. Progressive opacification over the right lower lobe with increased blunting of the right costophrenic angle, compatible with enlarging pleural effusion. 2. Bibasilar  atelectasis, similar to previous exam. Electronically signed by: Waddell Calk MD 06/19/2024 05:32 AM EDT RP Workstation: [id number]   DG Abd Portable 1V Result Date: 06/18/2024 CLINICAL DATA:  [id number] Encounter for feeding tube placement [id number] EXAM: PORTABLE ABDOMEN - 1 VIEW COMPARISON:  06/15/2024 chest, abdomen and pelvis CT FINDINGS: Weighted enteric tube tip is in the proximal stomach. No disproportionately dilated small bowel loops in the visualized upper abdomen. Retained oral contrast noted in the right colon. Chronic moderate elevation of the right hemidiaphragm with bibasilar  atelectasis. No evidence of pneumoperitoneum. IMPRESSION: Weighted enteric tube tip is in the proximal stomach. Electronically Signed   By: Selinda DELENA Blue M.D.   On: 06/18/2024 10:03   DG CHEST PORT 1 VIEW Result Date: 06/18/2024 CLINICAL DATA:  Left pneumothorax. EXAM: PORTABLE CHEST 1 VIEW COMPARISON:  Chest radiograph dated 06/17/2024 FINDINGS: Asymmetrically lower right lung volumes with asymmetric elevation of the right hemidiaphragm. Right-greater-than-left interstitial opacities. Minimal patchy bibasilar opacities. Slightly decreased small left apical pneumothorax. Trace blunting of bilateral costophrenic angles. Similar cardiomediastinal silhouette. Partially imaged right shoulder arthroplasty. Partially imaged right-sided colon contains intraluminal contrast. IMPRESSION: 1. Slightly decreased small left apical pneumothorax. 2. Right-greater-than-left interstitial opacities, which may represent atelectasis or pulmonary edema. 3. Minimal patchy bibasilar opacities, likely atelectasis. Electronically Signed   By: Limin  Xu M.D.   On: 06/18/2024 08:58    Anti-infectives: Anti-infectives (From admission, onward)    None        Assessment/Plan  Mechanical GLF   C7 anterosuperior corner VB fx with involvement of  C6-C7 ventral osteophyte and widening of the C6-C7 intervertebral disc space - NSGY c/s, Dr. Debby, continue collar Acute nondisplaced fracture through C5-C6 ventral osteophyte - NSGY c/s, Dr. Debby, continue collar T12 vertebral body fx with widening of T11-12 disc space - NSGY c/s, Dr. Debby, planning OR next week 8/5 after plavix  washout Occult L apical PTX, now with R effusion - CXR this AM appears stable but large R effusion noted, 40 lasix  IV and add duonebs and monitor. May need thora if respiratory status not improving  L 7-9 rib fractures - multimodal pain control, IS, pulm toilet Haziness in retroperitoneum around SMA - MRA somewhat motion degraded but no clear vascular  injury, abdominal exam is benign this AM Scalp laceration - small, local wound care Dysphagia - SLP following, did not pass MBS.  Cortrak placement today and initiated TFs BPH CKD stage IIIb - UOP improving, Cr up some this AM, lasix  and monitor, FWF added, off IVF T2DM - SSI, increased sensitivity and consult to DM coordinator Hepatic cirrhosis HTN - added scheduled lopressor  7/30 and increased to 25 mg BID 7/31, DC norvasc  this AM and monitor  Urinary retention - on flomax , but will change to doxazosin  when cortrak placed to give down tubr  HLD Hx of CVA Gout ABL anemia - hgb down to 7.1, no concern for active bleeding, repeat CBC at 1400 and tomorrow AM  FEN: meds in applesauce, TF and FWF VTE: SCDs, LMWH, holding home plavix  for now ID: no current indication for abx  Dispo: TLSO and collar, CXR this AM with enlarging R effusion. IV lasix  and monitor respiratory status closely. Will add sched duonebs as well. CBC this afternoon. Ok to transfer to 4N ICU.     LOS: 4 days   I reviewed Consultant NS notes, last 24 h vitals and pain scores, last 48 h intake and output, last 24 h labs and trends, and last 24 h imaging results.  Dave Wright, [facility] 06/19/2024, 10:24 AM Please see Amion for pager number during day hours 7:00am-4:30pm

## 2024-06-19 NOTE — Procedures (Signed)
 Bronchoscopy Procedure Note  Dave Wright  [id number]  1941/02/07  Date:06/19/24  Time:4:23 PM   Provider Performing:Naela Nodal   Procedure(s):  Flexible bronchoscopy with bronchial alveolar lavage (68375) and Initial Therapeutic Aspiration of Tracheobronchial Tree [phone])  Indication(s) Acute respiratory failure with hypoxia and hypercapnia  Consent Risks of the procedure as well as the alternatives and risks of each were explained to the patient and/or caregiver.  Consent for the procedure was obtained and is signed in the bedside chart  Anesthesia Propofol , fentanyl  and rocuronium    Time Out Verified patient identification, verified procedure, site/side was marked, verified correct patient position, special equipment/implants available, medications/allergies/relevant history reviewed, required imaging and test results available.   Sterile Technique Usual hand hygiene, masks, gowns, and gloves were used   Procedure Description Bronchoscope advanced through endotracheal tube and into airway.  Airways were examined down to subsegmental level with findings noted below.   Following diagnostic evaluation, BAL(s) performed in right upper lobe with normal saline and return of yellowish fluid  Findings: Copious amounts of tenacious secretions noted throughout the airway, suctioned and BAL performed     Complications/Tolerance None; patient tolerated the procedure well. Chest X-ray is not needed post procedure.   EBL Minimal   Specimen(s) BAL

## 2024-06-19 NOTE — Procedures (Signed)
 Arterial Catheter Insertion Procedure Note  Dave Wright  [id number]  01-20-1941  Date:06/19/24  Time:3:49 PM    Provider Performing: Valinda Novas    Procedure: Insertion of Arterial Line (63379) with US  guidance (23062)   Indication(s) Blood pressure monitoring and/or need for frequent ABGs  Consent Unable to obtain consent due to emergent nature of procedure.  Anesthesia None   Time Out Verified patient identification, verified procedure, site/side was marked, verified correct patient position, special equipment/implants available, medications/allergies/relevant history reviewed, required imaging and test results available.   Sterile Technique Maximal sterile technique including full sterile barrier drape, hand hygiene, sterile gown, sterile gloves, mask, hair covering, sterile ultrasound probe cover (if used).   Procedure Description Area of catheter insertion was cleaned with chlorhexidine  and draped in sterile fashion. With real-time ultrasound guidance an arterial catheter was placed into the left Axillary artery.  Appropriate arterial tracings confirmed on monitor.     Complications/Tolerance None; patient tolerated the procedure well.   EBL Minimal   Specimen(s) None

## 2024-06-19 NOTE — Progress Notes (Signed)
    Providing Compassionate, Quality Care - Together   NEUROSURGERY PROGRESS NOTE     S: Increased O2 requirements o/n.    O: EXAM:  BP (!) 146/48   Pulse 80   Temp 97.7 F (36.5 C) (Axillary)   Resp (!) 22   Ht 6' (1.829 m)   Wt 89.3 kg   SpO2 97%   BMI 26.70 kg/m     Awake, alert, oriented to baseline.   Speech fluent, appropriate  CNs grossly intact  BUE/BLE 5/5 SILTx4 Collar in place   ASSESSMENT:  83 y.o. with   -C5-6, 6-7 anterior osteophyte fracture with small epidural hematoma  -T10-T11 vertical cleft fracture with disc space involvement   PLAN: -Continue Collar -OR Tuesday pending medical stability, cont to hold plavix .  -Continue TLSO when upright.  -Call w/ questions/concerns.   Camie Pickle, [facility]

## 2024-06-19 NOTE — Progress Notes (Signed)
 Chaplain responds to code blue and supports pt's wife Ronal and daughter Burnard as they anxiously await news of his condition. Pt and wife are lifelong members of [employer] [address], and Ronal is in communication with her pastor and family members about pt's condition. Chaplain provides prayer and compassionate presence, encouraging life review as family allows. Burnard expresses anxiety and struggles to sit still, so she goes for a walk. She returns to hear staff update that pt's heart didn't stop and he's receiving appropriate care. Chaplain encourages them to request chaplain support if needed.

## 2024-06-19 NOTE — Procedures (Signed)
 Central Venous Catheter Insertion Procedure Note  Dave Wright  [id number]  1940/12/14  Date:06/19/24  Time:3:49 PM   Provider Performing:Elliot Meldrum   Procedure: Insertion of Non-tunneled Central Venous [phone]) with US  guidance (23062)   Indication(s) Medication administration  Consent Risks of the procedure as well as the alternatives and risks of each were explained to the patient and/or caregiver.  Consent for the procedure was obtained and is signed in the bedside chart  Anesthesia Topical only with 1% lidocaine    Timeout Verified patient identification, verified procedure, site/side was marked, verified correct patient position, special equipment/implants available, medications/allergies/relevant history reviewed, required imaging and test results available.  Sterile Technique Maximal sterile technique including full sterile barrier drape, hand hygiene, sterile gown, sterile gloves, mask, hair covering, sterile ultrasound probe cover (if used).  Procedure Description Area of catheter insertion was cleaned with chlorhexidine  and draped in sterile fashion.  With real-time ultrasound guidance a central venous catheter was placed into the left subclavian vein. Nonpulsatile blood flow and easy flushing noted in all ports.  The catheter was sutured in place and sterile dressing applied.  Complications/Tolerance None; patient tolerated the procedure well. Chest X-ray is ordered to verify placement for internal jugular or subclavian cannulation.   Chest x-ray is not ordered for femoral cannulation.  EBL Minimal  Specimen(s) None

## 2024-06-19 NOTE — Progress Notes (Signed)
 RT assisted Dr. Harold with bedside bronchoscopy. Sputum sample obtained and walked to lab at this time. RT will continue to monitor and be available as needed.

## 2024-06-19 NOTE — Addendum Note (Signed)
 Addendum  created 06/19/24 1552 by Leonce Athens, MD   Attestation recorded in Intraprocedure, Intraprocedure Attestations filed, Intraprocedure Event edited

## 2024-06-19 NOTE — Progress Notes (Signed)
 I was present when he rather acutely decompensated this afternoon. IVF bolus given as well as 1U PRBC Emergently intubated ABG showed significant hypercarbia CCM engaged and reviewed case with Dr. Harold whom immediately came to bedside to assist in his care  Noted interval changes including hypercarbic respiratory failure, multifactorial but appears to be aspiration  Now on full vent support Hemodynamics improved with fluid and prbc administration Appreciate CCM's excellent care   We will continue to follow closely  Daughter and spouse were present for much of the above and kept in the loop with all these changes. Explained the above and answered their questions.   Lonni Pizza, MD [facility], [facility]

## 2024-06-20 ENCOUNTER — Inpatient Hospital Stay (HOSPITAL_COMMUNITY)

## 2024-06-20 DIAGNOSIS — R6521 Severe sepsis with septic shock: Secondary | ICD-10-CM | POA: Diagnosis not present

## 2024-06-20 DIAGNOSIS — J9601 Acute respiratory failure with hypoxia: Secondary | ICD-10-CM | POA: Diagnosis not present

## 2024-06-20 DIAGNOSIS — A419 Sepsis, unspecified organism: Secondary | ICD-10-CM | POA: Diagnosis not present

## 2024-06-20 DIAGNOSIS — J9602 Acute respiratory failure with hypercapnia: Secondary | ICD-10-CM | POA: Diagnosis not present

## 2024-06-20 LAB — BASIC METABOLIC PANEL WITH GFR
Anion gap: 10 (ref 5–15)
BUN: 77 mg/dL — ABNORMAL HIGH (ref 8–23)
CO2: 21 mmol/L — ABNORMAL LOW (ref 22–32)
Calcium: 8.9 mg/dL (ref 8.9–10.3)
Chloride: 111 mmol/L (ref 98–111)
Creatinine, Ser: 2.52 mg/dL — ABNORMAL HIGH (ref 0.61–1.24)
GFR, Estimated: 24 mL/min — ABNORMAL LOW (ref >60)
Glucose, Bld: 219 mg/dL — ABNORMAL HIGH (ref 70–99)
Potassium: 3.4 mmol/L — ABNORMAL LOW (ref 3.5–5.1)
Sodium: 142 mmol/L (ref 135–145)

## 2024-06-20 LAB — GLUCOSE, CAPILLARY
Glucose-Capillary: 196 mg/dL — ABNORMAL HIGH (ref 70–99)
Glucose-Capillary: 203 mg/dL — ABNORMAL HIGH (ref 70–99)
Glucose-Capillary: 217 mg/dL — ABNORMAL HIGH (ref 70–99)
Glucose-Capillary: 290 mg/dL — ABNORMAL HIGH (ref 70–99)
Glucose-Capillary: 293 mg/dL — ABNORMAL HIGH (ref 70–99)
Glucose-Capillary: 308 mg/dL — ABNORMAL HIGH (ref 70–99)

## 2024-06-20 LAB — CBC
HCT: 24.5 % — ABNORMAL LOW (ref 39.0–52.0)
Hemoglobin: 8.2 g/dL — ABNORMAL LOW (ref 13.0–17.0)
MCH: 32.9 pg (ref 26.0–34.0)
MCHC: 33.5 g/dL (ref 30.0–36.0)
MCV: 98.4 fL (ref 80.0–100.0)
Platelets: 112 K/uL — ABNORMAL LOW (ref 150–400)
RBC: 2.49 MIL/uL — ABNORMAL LOW (ref 4.22–5.81)
RDW: 16.1 % — ABNORMAL HIGH (ref 11.5–15.5)
WBC: 12.8 K/uL — ABNORMAL HIGH (ref 4.0–10.5)
nRBC: 0 % (ref 0.0–0.2)

## 2024-06-20 LAB — POCT I-STAT 7, (LYTES, BLD GAS, ICA,H+H)
Acid-base deficit: 1 mmol/L (ref 0.0–2.0)
Bicarbonate: 20 mmol/L (ref 20.0–28.0)
Calcium, Ion: 1.28 mmol/L (ref 1.15–1.40)
HCT: 21 % — ABNORMAL LOW (ref 39.0–52.0)
Hemoglobin: 7.1 g/dL — ABNORMAL LOW (ref 13.0–17.0)
O2 Saturation: 97 %
Patient temperature: 36.7
Potassium: 3.5 mmol/L (ref 3.5–5.1)
Sodium: 143 mmol/L (ref 135–145)
TCO2: 21 mmol/L — ABNORMAL LOW (ref 22–32)
pCO2 arterial: 21 mmHg — ABNORMAL LOW (ref 32–48)
pH, Arterial: 7.586 — ABNORMAL HIGH (ref 7.35–7.45)
pO2, Arterial: 70 mmHg — ABNORMAL LOW (ref 83–108)

## 2024-06-20 LAB — PHOSPHORUS: Phosphorus: 1.5 mg/dL — ABNORMAL LOW (ref 2.5–4.6)

## 2024-06-20 LAB — MAGNESIUM: Magnesium: 2.2 mg/dL (ref 1.7–2.4)

## 2024-06-20 LAB — TRIGLYCERIDES: Triglycerides: 64 mg/dL (ref <150)

## 2024-06-20 MED ORDER — FREE WATER
100.0000 mL | Freq: Four times a day (QID) | Status: DC
  Administered 2024-06-20 – 2024-06-24 (×12): 100 mL

## 2024-06-20 MED ORDER — POTASSIUM PHOSPHATES 15 MMOLE/5ML IV SOLN
30.0000 mmol | Freq: Once | INTRAVENOUS | Status: AC
  Administered 2024-06-20: 30 mmol via INTRAVENOUS
  Filled 2024-06-20: qty 10

## 2024-06-20 MED ORDER — FUROSEMIDE 10 MG/ML IJ SOLN
40.0000 mg | Freq: Once | INTRAMUSCULAR | Status: AC
  Administered 2024-06-20: 40 mg via INTRAVENOUS
  Filled 2024-06-20: qty 4

## 2024-06-20 NOTE — Progress Notes (Signed)
    Providing Compassionate, Quality Care - Together   NEUROSURGERY PROGRESS NOTE     S: On ventilator.    O: EXAM:  BP (!) 96/41   Pulse 81   Temp 98.8 F (37.1 C)   Resp (!) 22   Ht 6' (1.829 m)   Wt 86.1 kg   SpO2 100%   BMI 25.74 kg/m     Intubated, sedated.  NAD Withdraws to pain in all extremities per nursing staff prior to increase in sedation Pupils 2mm b/l   ASSESSMENT:  83 y.o. with   -C5-6, 6-7 anterior osteophyte fracture with small epidural hematoma  -T10-T11 vertical cleft fracture with disc space involvement     PLAN: -Continue to monitor hemodynamic/respiratory status, appreciate trauma/CCM mgmt.  -Decision if/when to OR pending above.  -Call w/ questions/concerns.   Camie Pickle, [facility]

## 2024-06-20 NOTE — Progress Notes (Signed)
 Progress Note     Subjective: Intubated yesterday, respiratory failure due to aspiration. CCM assisting in his care at this point  Objective: Vital signs in last 24 hours: Temp:  [95 F (35 C)-99.5 F (37.5 C)] 98.8 F (37.1 C) (08/03 0745) Pulse Rate:  [45-151] 81 (08/03 0745) Resp:  [13-28] 22 (08/03 0745) BP: (52-154)/(25-55) 96/41 (08/02 1533) SpO2:  [72 %-100 %] 100 % (08/03 0745) Arterial Line BP: (87-166)/(35-62) 143/50 (08/03 0745) FiO2 (%):  [40 %-100 %] 40 % (08/03 0600) Weight:  [86.1 kg] 86.1 kg (08/03 0500) Last BM Date :  (PTA)  Intake/Output from previous day: 08/02 0701 - 08/03 0700 In: 3351.4 [I.V.:1415.4; Blood:283.1; NG/GT:1001.5; IV Piggyback:651.4] Out: 975 [Urine:975] Intake/Output this shift: Total I/O In: 254.6 [I.V.:230.3; IV Piggyback:24.3] Out: 40 [Urine:40]  PE: General: intubated, sedated Neck: cervical collar present  HEENT: small scalp laceration without active bleeding  Heart: RRR in the 80s  Abd: soft, NT, ND MS: MAEs, trace LE edema BL Skin: warm and dry with some scattered ecchymosis and skin tears on his upper extremities that are covered   Lab Results:  Recent Labs    06/19/24 1425 06/19/24 1540 06/20/24 0336  WBC 37.0*  --  12.8*  HGB 8.8* 6.8* 8.2*  HCT 28.9* 20.0* 24.5*  PLT 162  --  112*   BMET Recent Labs    06/19/24 0236 06/19/24 1540 06/20/24 0336  NA 140 143 142  K 4.4 4.9 3.4*  CL 113*  --  111  CO2 20*  --  21*  GLUCOSE 270*  --  219*  BUN 69*  --  77*  CREATININE 2.37*  --  2.52*  CALCIUM  8.6*  --  8.9   PT/INR No results for input(s): LABPROT, INR in the last 72 hours. CMP     Component Value Date/Time   NA 142 06/20/2024 0336   NA 141 05/24/2024 0000   K 3.4 (L) 06/20/2024 0336   CL 111 06/20/2024 0336   CO2 21 (L) 06/20/2024 0336   GLUCOSE 219 (H) 06/20/2024 0336   BUN 77 (H) 06/20/2024 0336   BUN 41 (A) 05/24/2024 0000   CREATININE 2.52 (H) 06/20/2024 0336   CREATININE 2.14 (H)  05/26/2023 0958   CREATININE 2.03 (H) 08/16/2022 1415   CALCIUM  8.9 06/20/2024 0336   PROT 5.1 (L) 06/15/2024 1559   ALBUMIN 3.1 (L) 06/15/2024 1559   AST 70 (H) 06/15/2024 1559   AST 62 (H) 05/26/2023 0958   ALT 33 06/15/2024 1559   ALT 40 05/26/2023 0958   ALKPHOS 41 06/15/2024 1559   BILITOT 1.1 06/15/2024 1559   BILITOT 1.0 05/26/2023 0958   GFRNONAA 24 (L) 06/20/2024 0336   GFRNONAA 30 (L) 05/26/2023 0958   GFRAA 34 01/31/2020 0000   Lipase  No results found for: LIPASE     Studies/Results: DG CHEST PORT 1 VIEW Result Date: 06/20/2024 EXAM: 1 VIEW XRAY OF THE CHEST 06/20/2024 05:08:00 AM COMPARISON: 07/13/2023 CLINICAL HISTORY: Pleural effusion, right. ETT present; T-Spine Fx. FINDINGS: LUNGS AND PLEURA: Hazy opacity over the hemithoraces, right greater than left, consistent with layering effusions, with some adjacent atelectasis/consolidation in the lower lungs. HEART AND MEDIASTINUM: No acute abnormality of the cardiac and mediastinal silhouettes. Atheromatous aorta. BONES AND SOFT TISSUES: Post  right shoulder arthroplasty. No acute osseous abnormality. LINES AND TUBES: Endotracheal tube, feeding tube, left subclavian central line stable in position. IMPRESSION: 1. Layering effusions, right greater than left, with adjacent atelectasis/consolidation in the lower lungs.  Electronically signed by: Katheleen Faes MD 06/20/2024 07:59 AM EDT RP Workstation: [id number]   DG CHEST PORT 1 VIEW Result Date: 06/19/2024 CLINICAL DATA:  Intubated EXAM: PORTABLE CHEST 1 VIEW COMPARISON:  06/19/2024, 06/18/2024 FINDINGS: Single frontal view of the chest demonstrates endotracheal tube overlying tracheal air column, tip at thoracic inlet approximately 6.3 cm above carina. Enteric catheter passes below diaphragm tip excluded by collimation. Left subclavian central venous catheter tip overlies the superior vena cava. Cardiac silhouette is unremarkable. There is progressive bibasilar consolidation, right  greater than left. Blunting of the right costophrenic angle consistent with underlying right effusion. Trace residual left apical pneumothorax, decreased since the 06/18/2024 exam and unchanged from the study performed earlier today. No acute bony abnormalities. IMPRESSION: 1. Progressive bibasilar consolidation consistent with worsening airspace disease or atelectasis. 2. Continued right pleural effusion. 3. Trace left apical pneumothorax, volume estimated far less than 5%. Moderate improvement since 06/18/2024. 4. Support devices as above. Electronically Signed   By: Ozell Daring M.D.   On: 06/19/2024 15:59   DG Chest Portable 1 View Result Date: 06/19/2024 EXAM: 1 VIEW XRAY OF THE CHEST 06/19/2024 05:02:22 AM COMPARISON: 06/18/2024 CLINICAL HISTORY: Wet lung sounds, r/o PNA. FINDINGS: LUNGS AND PLEURA: Progressive opacification over the right lower lobe with increased blunting of the right costophrenic angle compatible with enlarging pleural effusion. Bibasilar atelectasis is similar to previous exam. HEART AND MEDIASTINUM: Aortic atherosclerotic calcification. Asymmetric elevation of the right hemidiaphragm. BONES AND SOFT TISSUES: No acute osseous abnormality. LINES AND TUBES: There is a feeding tube with the tip below the GE junction. Contrast material is identified within the imaged portions of the colon. IMPRESSION: 1. Progressive opacification over the right lower lobe with increased blunting of the right costophrenic angle, compatible with enlarging pleural effusion. 2. Bibasilar atelectasis, similar to previous exam. Electronically signed by: Waddell Calk MD 06/19/2024 05:32 AM EDT RP Workstation: [id number]   DG Abd Portable 1V Result Date: 06/18/2024 CLINICAL DATA:  [id number] Encounter for feeding tube placement [id number] EXAM: PORTABLE ABDOMEN - 1 VIEW COMPARISON:  06/15/2024 chest, abdomen and pelvis CT FINDINGS: Weighted enteric tube tip is in the proximal stomach. No disproportionately dilated  small bowel loops in the visualized upper abdomen. Retained oral contrast noted in the right colon. Chronic moderate elevation of the right hemidiaphragm with bibasilar atelectasis. No evidence of pneumoperitoneum. IMPRESSION: Weighted enteric tube tip is in the proximal stomach. Electronically Signed   By: Selinda DELENA Blue M.D.   On: 06/18/2024 10:03   DG CHEST PORT 1 VIEW Result Date: 06/18/2024 CLINICAL DATA:  Left pneumothorax. EXAM: PORTABLE CHEST 1 VIEW COMPARISON:  Chest radiograph dated 06/17/2024 FINDINGS: Asymmetrically lower right lung volumes with asymmetric elevation of the right hemidiaphragm. Right-greater-than-left interstitial opacities. Minimal patchy bibasilar opacities. Slightly decreased small left apical pneumothorax. Trace blunting of bilateral costophrenic angles. Similar cardiomediastinal silhouette. Partially imaged right shoulder arthroplasty. Partially imaged right-sided colon contains intraluminal contrast. IMPRESSION: 1. Slightly decreased small left apical pneumothorax. 2. Right-greater-than-left interstitial opacities, which may represent atelectasis or pulmonary edema. 3. Minimal patchy bibasilar opacities, likely atelectasis. Electronically Signed   By: Limin  Xu M.D.   On: 06/18/2024 08:58    Anti-infectives: Anti-infectives (From admission, onward)    Start     Dose/Rate Route Frequency Ordered Stop   06/19/24 1645  piperacillin -tazobactam (ZOSYN ) IVPB 3.375 g        3.375 g 12.5 mL/hr over 240 Minutes Intravenous Every 8 hours 06/19/24 1555          Assessment/Plan  Mechanical GLF   Acute hypercarbic respiratory failure due to aspiration - ventilator support, on empiric IV Zosyn  (8-2). Bedside US  with CCM showed no significant effusion. Appreciate CCM excellent assistance in his care  FEN: Hypophos, hypokalemia - kphos today; Tube feeds, FW  C7 anterosuperior corner VB fx with involvement of  C6-C7 ventral osteophyte and widening of the C6-C7  intervertebral disc space - NSGY c/s, Dr. Debby, continue collar Acute nondisplaced fracture through C5-C6 ventral osteophyte - NSGY c/s, Dr. Debby, continue collar T12 vertebral body fx with widening of T11-12 disc space - NSGY c/s, Dr. Debby, planning OR next week 8/5 after plavix  washout Occult L apical PTX, now with R effusion - CXR this AM appears stable but large R effusion noted, 40 lasix  IV and add duonebs and monitor. May need thora if respiratory status not improving  L 7-9 rib fractures - multimodal pain control, IS, pulm toilet Haziness in retroperitoneum around SMA - MRA somewhat motion degraded but no clear vascular injury, abdominal exam is benign this AM Scalp laceration - small, local wound care Dysphagia - SLP following, did not pass MBS.  Cortrak placement today and initiated TFs BPH CKD stage IIIb - UOP improving, Cr up some this AM, lasix  and monitor, FWF added, off IVF T2DM - SSI, increased sensitivity and consult to DM coordinator Hepatic cirrhosis HTN - added scheduled lopressor  7/30 and increased to 25 mg BID 7/31, DC norvasc  this AM and monitor  Urinary retention - on flomax , foley for now HLD Hx of CVA Gout ABL anemia - 1U PRBC 8-2 with appropriate response, now 8.2.  No concern for active bleeding, monitor  VTE: SCDs, LMWH, holding home plavix  for now ID: no current indication for abx  Dispo: ICU   CRITICAL CARE Performed by: Lonni CHRISTELLA Pizza   Total critical care time: 30 minutes  Critical care time was exclusive of separately billable procedures and treating other patients.  Critical care was necessary to treat or prevent imminent or life-threatening deterioration.   LOS: 5 days   I reviewed Consultant NS notes, last 24 h vitals and pain scores, last 48 h intake and output, last 24 h labs and trends, and last 24 h imaging results.   Lonni CHRISTELLA Pizza, MD  [facility] 06/20/2024, 8:06 AM

## 2024-06-20 NOTE — Plan of Care (Signed)
  Problem: Pain Managment: Goal: General experience of comfort will improve and/or be controlled Outcome: Progressing

## 2024-06-20 NOTE — Inpatient Diabetes Management (Signed)
 Inpatient Diabetes Program Recommendations  AACE/ADA: New Consensus Statement on Inpatient Glycemic Control (2015)  Target Ranges:  Prepandial:   less than 140 mg/dL      Peak postprandial:   less than 180 mg/dL (1-2 hours)      Critically ill patients:  140 - 180 mg/dL   Lab Results  Component Value Date   GLUCAP 203 (H) 06/20/2024   HGBA1C 5.5 06/07/2024   HGBA1C 5.5 06/07/2024   HGBA1C 5.5 (A) 06/07/2024   HGBA1C 5.5 06/07/2024    Review of Glycemic Control  Diabetes history: DM2 Outpatient Diabetes medications: Lantus  18 units QD Current orders for Inpatient glycemic control: Novolog  0-15 Q4H  On TF Osmolite 1.5 with goal rate   HgbA1C - 5.5%  Inpatient Diabetes Program Recommendations:    Consider adding Semglee  6 units BID  Consider adding Novolog  3 units Q4H for TF coverage. Do not give if TF is stopped for any reason.  Will continue to follow glucose trends.  Thank you. Shona Brandy, RD, LDN, CDCES Inpatient Diabetes Coordinator [phone]
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
## 2024-06-21 ENCOUNTER — Encounter (HOSPITAL_COMMUNITY): Payer: Self-pay

## 2024-06-21 DIAGNOSIS — R739 Hyperglycemia, unspecified: Secondary | ICD-10-CM

## 2024-06-21 DIAGNOSIS — J9601 Acute respiratory failure with hypoxia: Secondary | ICD-10-CM | POA: Diagnosis not present

## 2024-06-21 DIAGNOSIS — R9431 Abnormal electrocardiogram [ECG] [EKG]: Secondary | ICD-10-CM

## 2024-06-21 DIAGNOSIS — J9602 Acute respiratory failure with hypercapnia: Secondary | ICD-10-CM | POA: Diagnosis not present

## 2024-06-21 DIAGNOSIS — A419 Sepsis, unspecified organism: Secondary | ICD-10-CM | POA: Diagnosis not present

## 2024-06-21 LAB — POCT I-STAT 7, (LYTES, BLD GAS, ICA,H+H)
Acid-base deficit: 3 mmol/L — ABNORMAL HIGH (ref 0.0–2.0)
Bicarbonate: 22.3 mmol/L (ref 20.0–28.0)
Calcium, Ion: 1.24 mmol/L (ref 1.15–1.40)
HCT: 24 % — ABNORMAL LOW (ref 39.0–52.0)
Hemoglobin: 8.2 g/dL — ABNORMAL LOW (ref 13.0–17.0)
O2 Saturation: 94 %
Patient temperature: 37.3
Potassium: 4.1 mmol/L (ref 3.5–5.1)
Sodium: 141 mmol/L (ref 135–145)
TCO2: 23 mmol/L (ref 22–32)
pCO2 arterial: 38.2 mmHg (ref 32–48)
pH, Arterial: 7.376 (ref 7.35–7.45)
pO2, Arterial: 74 mmHg — ABNORMAL LOW (ref 83–108)

## 2024-06-21 LAB — CBC WITH DIFFERENTIAL/PLATELET
Abs Immature Granulocytes: 0.19 K/uL — ABNORMAL HIGH (ref 0.00–0.07)
Basophils Absolute: 0 K/uL (ref 0.0–0.1)
Basophils Relative: 0 %
Eosinophils Absolute: 0 K/uL (ref 0.0–0.5)
Eosinophils Relative: 0 %
HCT: 26.7 % — ABNORMAL LOW (ref 39.0–52.0)
Hemoglobin: 8.5 g/dL — ABNORMAL LOW (ref 13.0–17.0)
Immature Granulocytes: 2 %
Lymphocytes Relative: 7 %
Lymphs Abs: 0.6 K/uL — ABNORMAL LOW (ref 0.7–4.0)
MCH: 32.6 pg (ref 26.0–34.0)
MCHC: 31.8 g/dL (ref 30.0–36.0)
MCV: 102.3 fL — ABNORMAL HIGH (ref 80.0–100.0)
Monocytes Absolute: 1.2 K/uL — ABNORMAL HIGH (ref 0.1–1.0)
Monocytes Relative: 13 %
Neutro Abs: 7.3 K/uL (ref 1.7–7.7)
Neutrophils Relative %: 78 %
Platelets: 144 K/uL — ABNORMAL LOW (ref 150–400)
RBC: 2.61 MIL/uL — ABNORMAL LOW (ref 4.22–5.81)
RDW: 16.8 % — ABNORMAL HIGH (ref 11.5–15.5)
WBC: 9.3 K/uL (ref 4.0–10.5)
nRBC: 0 % (ref 0.0–0.2)

## 2024-06-21 LAB — GLUCOSE, CAPILLARY
Glucose-Capillary: 288 mg/dL — ABNORMAL HIGH (ref 70–99)
Glucose-Capillary: 320 mg/dL — ABNORMAL HIGH (ref 70–99)
Glucose-Capillary: 309 mg/dL — ABNORMAL HIGH (ref 70–99)
Glucose-Capillary: 273 mg/dL — ABNORMAL HIGH (ref 70–99)
Glucose-Capillary: 295 mg/dL — ABNORMAL HIGH (ref 70–99)
Glucose-Capillary: 344 mg/dL — ABNORMAL HIGH (ref 70–99)
Glucose-Capillary: 382 mg/dL — ABNORMAL HIGH (ref 70–99)
Glucose-Capillary: 386 mg/dL — ABNORMAL HIGH (ref 70–99)
Glucose-Capillary: 437 mg/dL — ABNORMAL HIGH (ref 70–99)
Glucose-Capillary: 420 mg/dL — ABNORMAL HIGH (ref 70–99)
Glucose-Capillary: 446 mg/dL — ABNORMAL HIGH (ref 70–99)
Glucose-Capillary: 372 mg/dL — ABNORMAL HIGH (ref 70–99)
Glucose-Capillary: 320 mg/dL — ABNORMAL HIGH (ref 70–99)

## 2024-06-21 LAB — CBC
HCT: 25.5 % — ABNORMAL LOW (ref 39.0–52.0)
Hemoglobin: 8 g/dL — ABNORMAL LOW (ref 13.0–17.0)
MCH: 32.3 pg (ref 26.0–34.0)
MCHC: 31.4 g/dL (ref 30.0–36.0)
MCV: 102.8 fL — ABNORMAL HIGH (ref 80.0–100.0)
Platelets: 118 K/uL — ABNORMAL LOW (ref 150–400)
RBC: 2.48 MIL/uL — ABNORMAL LOW (ref 4.22–5.81)
RDW: 16.5 % — ABNORMAL HIGH (ref 11.5–15.5)
WBC: 8.5 K/uL (ref 4.0–10.5)
nRBC: 0 % (ref 0.0–0.2)

## 2024-06-21 LAB — RENAL FUNCTION PANEL
Albumin: 1.9 g/dL — ABNORMAL LOW (ref 3.5–5.0)
Anion gap: 10 (ref 5–15)
BUN: 104 mg/dL — ABNORMAL HIGH (ref 8–23)
CO2: 21 mmol/L — ABNORMAL LOW (ref 22–32)
Calcium: 9.1 mg/dL (ref 8.9–10.3)
Chloride: 107 mmol/L (ref 98–111)
Creatinine, Ser: 2.91 mg/dL — ABNORMAL HIGH (ref 0.61–1.24)
GFR, Estimated: 21 mL/min — ABNORMAL LOW (ref >60)
Glucose, Bld: 406 mg/dL — ABNORMAL HIGH (ref 70–99)
Phosphorus: 3.4 mg/dL (ref 2.5–4.6)
Potassium: 4.3 mmol/L (ref 3.5–5.1)
Sodium: 138 mmol/L (ref 135–145)

## 2024-06-21 LAB — BASIC METABOLIC PANEL WITH GFR
Anion gap: 9 (ref 5–15)
Anion gap: 7 (ref 5–15)
Anion gap: 10 (ref 5–15)
BUN: 88 mg/dL — ABNORMAL HIGH (ref 8–23)
BUN: 78 mg/dL — ABNORMAL HIGH (ref 8–23)
BUN: 103 mg/dL — ABNORMAL HIGH (ref 8–23)
CO2: 21 mmol/L — ABNORMAL LOW (ref 22–32)
CO2: 16 mmol/L — ABNORMAL LOW (ref 22–32)
CO2: 20 mmol/L — ABNORMAL LOW (ref 22–32)
Calcium: 8.3 mg/dL — ABNORMAL LOW (ref 8.9–10.3)
Calcium: 6.5 mg/dL — ABNORMAL LOW (ref 8.9–10.3)
Calcium: 9.3 mg/dL (ref 8.9–10.3)
Chloride: 110 mmol/L (ref 98–111)
Chloride: 120 mmol/L — ABNORMAL HIGH (ref 98–111)
Chloride: 106 mmol/L (ref 98–111)
Creatinine, Ser: 2.56 mg/dL — ABNORMAL HIGH (ref 0.61–1.24)
Creatinine, Ser: 2.14 mg/dL — ABNORMAL HIGH (ref 0.61–1.24)
Creatinine, Ser: 2.89 mg/dL — ABNORMAL HIGH (ref 0.61–1.24)
GFR, Estimated: 24 mL/min — ABNORMAL LOW (ref >60)
GFR, Estimated: 30 mL/min — ABNORMAL LOW (ref >60)
GFR, Estimated: 21 mL/min — ABNORMAL LOW (ref >60)
Glucose, Bld: 311 mg/dL — ABNORMAL HIGH (ref 70–99)
Glucose, Bld: 235 mg/dL — ABNORMAL HIGH (ref 70–99)
Glucose, Bld: 469 mg/dL — ABNORMAL HIGH (ref 70–99)
Potassium: 4 mmol/L (ref 3.5–5.1)
Potassium: 3.3 mmol/L — ABNORMAL LOW (ref 3.5–5.1)
Potassium: 4.8 mmol/L (ref 3.5–5.1)
Sodium: 140 mmol/L (ref 135–145)
Sodium: 143 mmol/L (ref 135–145)
Sodium: 136 mmol/L (ref 135–145)

## 2024-06-21 LAB — LACTIC ACID, PLASMA
Lactic Acid, Venous: 1.8 mmol/L (ref 0.5–1.9)
Lactic Acid, Venous: 1.5 mmol/L (ref 0.5–1.9)

## 2024-06-21 LAB — PHOSPHORUS: Phosphorus: 3.5 mg/dL (ref 2.5–4.6)

## 2024-06-21 LAB — MAGNESIUM: Magnesium: 2.2 mg/dL (ref 1.7–2.4)

## 2024-06-21 MED ORDER — INSULIN GLARGINE-YFGN 100 UNIT/ML ~~LOC~~ SOLN: 25.0000 [IU] | Freq: Two times a day (BID) | SUBCUTANEOUS | Status: DC

## 2024-06-21 MED ORDER — LACTATED RINGERS IV SOLN: INTRAVENOUS | Status: AC

## 2024-06-21 MED ORDER — FUROSEMIDE 10 MG/ML IJ SOLN
40.0000 mg | Freq: Every day | INTRAMUSCULAR | Status: DC
  Administered 2024-06-21: 40 mg via INTRAVENOUS
  Filled 2024-06-21: qty 4

## 2024-06-21 MED ORDER — DEXTROSE 50 % IV SOLN: 0.0000 mL | INTRAVENOUS | Status: DC | PRN

## 2024-06-21 MED ORDER — BISACODYL 5 MG PO TBEC: 10.0000 mg | DELAYED_RELEASE_TABLET | Freq: Once | ORAL | Status: DC

## 2024-06-21 MED ORDER — MAGNESIUM HYDROXIDE 400 MG/5ML PO SUSP
30.0000 mL | Freq: Once | ORAL | Status: AC
  Administered 2024-06-21: 30 mL
  Filled 2024-06-21: qty 30

## 2024-06-21 MED ORDER — FUROSEMIDE 10 MG/ML IJ SOLN
80.0000 mg | Freq: Once | INTRAMUSCULAR | Status: AC
  Administered 2024-06-21: 80 mg via INTRAVENOUS
  Filled 2024-06-21: qty 8

## 2024-06-21 MED ORDER — INSULIN REGULAR(HUMAN) IN NACL 100-0.9 UT/100ML-% IV SOLN
INTRAVENOUS | Status: DC
  Administered 2024-06-21: 10.5 [IU]/h via INTRAVENOUS
  Administered 2024-06-21: 32 [IU]/h via INTRAVENOUS
  Administered 2024-06-22: 5.5 [IU]/h via INTRAVENOUS
  Administered 2024-06-22: 22 [IU]/h via INTRAVENOUS
  Filled 2024-06-21 (×4): qty 100

## 2024-06-21 MED ORDER — SODIUM CHLORIDE 3 % IN NEBU
4.0000 mL | INHALATION_SOLUTION | RESPIRATORY_TRACT | Status: AC
  Administered 2024-06-21 – 2024-06-24 (×17): 4 mL via RESPIRATORY_TRACT
  Filled 2024-06-21 (×17): qty 4

## 2024-06-21 MED ORDER — BISACODYL 10 MG RE SUPP
10.0000 mg | Freq: Once | RECTAL | Status: AC
  Administered 2024-06-21: 10 mg via RECTAL
  Filled 2024-06-21: qty 1

## 2024-06-21 MED ORDER — DEXTROSE IN LACTATED RINGERS 5 % IV SOLN
INTRAVENOUS | Status: AC
  Filled 2024-06-21: qty 1000

## 2024-06-21 MED ORDER — MAGNESIUM CITRATE PO SOLN
1.0000 | Freq: Once | ORAL | Status: AC
  Administered 2024-06-21: 1
  Filled 2024-06-21: qty 296

## 2024-06-21 MED ORDER — MIDAZOLAM BOLUS VIA INFUSION
0.0000 mg | INTRAVENOUS | Status: DC | PRN
  Administered 2024-06-22 (×2): 2 mg via INTRAVENOUS

## 2024-06-21 MED ORDER — MIDAZOLAM-SODIUM CHLORIDE 100-0.9 MG/100ML-% IV SOLN
INTRAVENOUS | Status: AC
  Administered 2024-06-21: 2 mg/h via INTRAVENOUS
  Filled 2024-06-21: qty 100

## 2024-06-21 MED ORDER — DEXMEDETOMIDINE HCL IN NACL 400 MCG/100ML IV SOLN
0.0000 ug/kg/h | INTRAVENOUS | Status: DC
  Administered 2024-06-21: 0.4 ug/kg/h via INTRAVENOUS
  Filled 2024-06-21: qty 100

## 2024-06-21 MED ORDER — POLYETHYLENE GLYCOL 3350 17 G PO PACK
17.0000 g | PACK | Freq: Two times a day (BID) | ORAL | Status: DC
  Administered 2024-06-21: 17 g
  Filled 2024-06-21: qty 1

## 2024-06-21 MED ORDER — POTASSIUM CHLORIDE 10 MEQ/100ML IV SOLN
10.0000 meq | INTRAVENOUS | Status: DC
  Administered 2024-06-21 (×2): 10 meq via INTRAVENOUS
  Filled 2024-06-21 (×2): qty 100

## 2024-06-21 MED ORDER — CALCIUM GLUCONATE-NACL 2-0.675 GM/100ML-% IV SOLN
2.0000 g | Freq: Once | INTRAVENOUS | Status: AC
  Administered 2024-06-21: 2000 mg via INTRAVENOUS
  Filled 2024-06-21: qty 100

## 2024-06-21 MED ORDER — MIDAZOLAM-SODIUM CHLORIDE 100-0.9 MG/100ML-% IV SOLN
0.0000 mg/h | INTRAVENOUS | Status: DC
  Administered 2024-06-22: 2 mg/h via INTRAVENOUS
  Filled 2024-06-21: qty 100

## 2024-06-21 MED ORDER — INSULIN ASPART 100 UNIT/ML IJ SOLN
3.0000 [IU] | INTRAMUSCULAR | Status: DC
  Administered 2024-06-21 (×2): 3 [IU] via SUBCUTANEOUS

## 2024-06-21 MED ORDER — SENNA 8.6 MG PO TABS
2.0000 | ORAL_TABLET | Freq: Once | ORAL | Status: AC
  Administered 2024-06-21: 17.2 mg
  Filled 2024-06-21: qty 2

## 2024-06-21 MED ORDER — IPRATROPIUM-ALBUTEROL 0.5-2.5 (3) MG/3ML IN SOLN
3.0000 mL | RESPIRATORY_TRACT | Status: DC
  Administered 2024-06-21 – 2024-06-25 (×23): 3 mL via RESPIRATORY_TRACT
  Filled 2024-06-21 (×23): qty 3

## 2024-06-21 MED ORDER — SODIUM CHLORIDE 0.9 % IV SOLN
4.0000 g | Freq: Once | INTRAVENOUS | Status: AC
  Administered 2024-06-21: 4 g via INTRAVENOUS
  Filled 2024-06-21: qty 40

## 2024-06-21 MED ORDER — INSULIN GLARGINE-YFGN 100 UNIT/ML ~~LOC~~ SOLN
25.0000 [IU] | Freq: Every day | SUBCUTANEOUS | Status: DC
  Administered 2024-06-21: 25 [IU] via SUBCUTANEOUS
  Filled 2024-06-21: qty 0.25

## 2024-06-21 NOTE — Progress Notes (Signed)
 SLP Cancellation Note  Patient Details Name: Dave Wright MRN: [id number] DOB: 08/08/1941   Cancelled treatment:       Reason Eval/Treat Not Completed: Medical issues which prohibited therapy. Unfortunately pt was intubated over the weekend due to respiratory failure and concern for aspiration pneumonia. SLP will hold. Will plan to follow up later this week pending respiratory improvement and ability to liberate from the ventilator.    Sherma Vanmetre [name] 06/21/2024, 2:10 PM

## 2024-06-21 NOTE — Progress Notes (Signed)
 Trauma/Critical Care Follow Up Note  Subjective:    Overnight Issues:   Objective:  Vital signs for last 24 hours: Temp:  [96.3 F (35.7 C)-99.5 F (37.5 C)] 99.5 F (37.5 C) (08/04 0700) Pulse Rate:  [50-104] 101 (08/04 0700) Resp:  [8-36] 18 (08/04 0700) BP: (87-133)/(41-61) 133/43 (08/04 0736) SpO2:  [100 %] 100 % (08/04 0700) Arterial Line BP: (93-160)/(33-60) 130/42 (08/04 0700) FiO2 (%):  [40 %] 40 % (08/04 0736)  Hemodynamic parameters for last 24 hours:    Intake/Output from previous day: 08/03 0701 - 08/04 0700 In: 4308.5 [I.V.:1842.7; NG/GT:1770.4; IV Piggyback:695.4] Out: 1110 [Urine:1110]  Intake/Output this shift: No intake/output data recorded.  Vent settings for last 24 hours: Vent Mode: PRVC FiO2 (%):  [40 %] 40 % Set Rate:  [14 bmp-18 bmp] 14 bmp Vt Set:  [540 mL] 540 mL PEEP:  [5 cmH20] 5 cmH20 Plateau Pressure:  [12 cmH20-18 cmH20] 18 cmH20  Physical Exam:  Gen: comfortable, no distress Neuro: not following commands HEENT: PERRL Neck: c-collar in place CV: AF Pulm: unlabored breathing on mechanical ventilation-full support Abd: soft, NT  , no recent BM GU: urine clear and yellow, +Foley Extr: wwp, no edema  Results for orders placed or performed during the hospital encounter of 06/15/24 (from the past 24 hours)  Glucose, capillary     Status: Abnormal   Collection Time: 06/20/24  7:52 AM  Result Value Ref Range   Glucose-Capillary 217 (H) 70 - 99 mg/dL  I-STAT 7, (LYTES, BLD GAS, ICA, H+H)     Status: Abnormal   Collection Time: 06/20/24 10:06 AM  Result Value Ref Range   pH, Arterial 7.586 (H) 7.35 - 7.45   pCO2 arterial 21.0 (L) 32 - 48 mmHg   pO2, Arterial 70 (L) 83 - 108 mmHg   Bicarbonate 20.0 20.0 - 28.0 mmol/L   TCO2 21 (L) 22 - 32 mmol/L   O2 Saturation 97 %   Acid-base deficit 1.0 0.0 - 2.0 mmol/L   Sodium 143 135 - 145 mmol/L   Potassium 3.5 3.5 - 5.1 mmol/L   Calcium , Ion 1.28 1.15 - 1.40 mmol/L   HCT 21.0 (L) 39.0 -  52.0 %   Hemoglobin 7.1 (L) 13.0 - 17.0 g/dL   Patient temperature 63.2 C    Sample type ARTERIAL   Glucose, capillary     Status: Abnormal   Collection Time: 06/20/24 11:13 AM  Result Value Ref Range   Glucose-Capillary 203 (H) 70 - 99 mg/dL  Glucose, capillary     Status: Abnormal   Collection Time: 06/20/24  4:02 PM  Result Value Ref Range   Glucose-Capillary 290 (H) 70 - 99 mg/dL  Glucose, capillary     Status: Abnormal   Collection Time: 06/20/24  7:42 PM  Result Value Ref Range   Glucose-Capillary 308 (H) 70 - 99 mg/dL  Glucose, capillary     Status: Abnormal   Collection Time: 06/20/24 11:32 PM  Result Value Ref Range   Glucose-Capillary 293 (H) 70 - 99 mg/dL  Magnesium      Status: None   Collection Time: 06/21/24  3:22 AM  Result Value Ref Range   Magnesium  2.2 1.7 - 2.4 mg/dL  Phosphorus     Status: None   Collection Time: 06/21/24  3:22 AM  Result Value Ref Range   Phosphorus 3.5 2.5 - 4.6 mg/dL  CBC with Differential/Platelet     Status: Abnormal   Collection Time: 06/21/24  3:22 AM  Result  Value Ref Range   WBC 9.3 4.0 - 10.5 K/uL   RBC 2.61 (L) 4.22 - 5.81 MIL/uL   Hemoglobin 8.5 (L) 13.0 - 17.0 g/dL   HCT 73.2 (L) 60.9 - 47.9 %   MCV 102.3 (H) 80.0 - 100.0 fL   MCH 32.6 26.0 - 34.0 pg   MCHC 31.8 30.0 - 36.0 g/dL   RDW 83.1 (H) 88.4 - 84.4 %   Platelets 144 (L) 150 - 400 K/uL   nRBC 0.0 0.0 - 0.2 %   Neutrophils Relative % 78 %   Neutro Abs 7.3 1.7 - 7.7 K/uL   Lymphocytes Relative 7 %   Lymphs Abs 0.6 (L) 0.7 - 4.0 K/uL   Monocytes Relative 13 %   Monocytes Absolute 1.2 (H) 0.1 - 1.0 K/uL   Eosinophils Relative 0 %   Eosinophils Absolute 0.0 0.0 - 0.5 K/uL   Basophils Relative 0 %   Basophils Absolute 0.0 0.0 - 0.1 K/uL   Immature Granulocytes 2 %   Abs Immature Granulocytes 0.19 (H) 0.00 - 0.07 K/uL  Basic metabolic panel     Status: Abnormal   Collection Time: 06/21/24  3:22 AM  Result Value Ref Range   Sodium 140 135 - 145 mmol/L   Potassium  4.0 3.5 - 5.1 mmol/L   Chloride 110 98 - 111 mmol/L   CO2 21 (L) 22 - 32 mmol/L   Glucose, Bld 311 (H) 70 - 99 mg/dL   BUN 88 (H) 8 - 23 mg/dL   Creatinine, Ser 7.43 (H) 0.61 - 1.24 mg/dL   Calcium  8.3 (L) 8.9 - 10.3 mg/dL   GFR, Estimated 24 (L) >60 mL/min   Anion gap 9 5 - 15  Glucose, capillary     Status: Abnormal   Collection Time: 06/21/24  3:29 AM  Result Value Ref Range   Glucose-Capillary 288 (H) 70 - 99 mg/dL  I-STAT 7, (LYTES, BLD GAS, ICA, H+H)     Status: Abnormal   Collection Time: 06/21/24  3:32 AM  Result Value Ref Range   pH, Arterial 7.376 7.35 - 7.45   pCO2 arterial 38.2 32 - 48 mmHg   pO2, Arterial 74 (L) 83 - 108 mmHg   Bicarbonate 22.3 20.0 - 28.0 mmol/L   TCO2 23 22 - 32 mmol/L   O2 Saturation 94 %   Acid-base deficit 3.0 (H) 0.0 - 2.0 mmol/L   Sodium 141 135 - 145 mmol/L   Potassium 4.1 3.5 - 5.1 mmol/L   Calcium , Ion 1.24 1.15 - 1.40 mmol/L   HCT 24.0 (L) 39.0 - 52.0 %   Hemoglobin 8.2 (L) 13.0 - 17.0 g/dL   Patient temperature 62.6 C    Sample type ARTERIAL   Glucose, capillary     Status: Abnormal   Collection Time: 06/21/24  7:27 AM  Result Value Ref Range   Glucose-Capillary 320 (H) 70 - 99 mg/dL    Assessment & Plan: The plan of care was discussed with the bedside nurse for the day, Chiquita, who is in agreement with this plan and no additional concerns were raised.   Present on Admission:  Thoracic spine fracture (HCC)    LOS: 6 days   Additional comments:I reviewed the patient's new clinical lab test results.   and I reviewed the patients new imaging test results.    Mechanical GLF  Acute hypercarbic respiratory failure due to aspiration - intubated 8/2 after aspiration event  C7 anterosuperior corner VB fx with involvement of  C6-C7 ventral osteophyte and widening of the C6-C7 intervertebral disc space - NSGY c/s, Dr. Debby, continue collar Acute nondisplaced fracture through C5-C6 ventral osteophyte - NSGY c/s, Dr. Debby, continue  collar T12 vertebral body fx with widening of T11-12 disc space - NSGY c/s, Dr. Debby, planning OR 8/5 after plavix  washout Occult L apical PTX, now with R effusion  L 7-9 rib fractures  Haziness in retroperitoneum around SMA - MRA somewhat motion degraded but no clear vascular injury Scalp laceration - small, local wound care Dysphagia  BPH CKD stage IIIb T2DM  Hepatic cirrhosis HTN Urinary retention  HLD Hx of CVA Gout ABL anemia   Neuro - not f/c, but sedated, does respond to pain - plan OR 8/5 with NSGY for c-spine stabilization, stable from my standpoint for OR, but plan to reassess in AM - change propofol  to precedex  - continue cervical collar  CV - suspected septic shock, on levophed , liberalize BP goals to SBP>100 or MAP>65 - appears to be in rate controlled AF, intermittently goes up to 140s, currently changing aedation agents, reassess after changeover - hold home metoprolol  - hold home doxazosin   Pulm - intubated 8/2 after aspiration event, minimal setting currently - resp cx pending, on empiric zosyn  - nebs and chest   FEN/GI - TF via cortrak at 65/h (goal) - escalate bowel regimen (no BM since admission)  GU  - creatinine 2.56, stable - foley, UOP marginal - lasix  admin in the setting of volume overload  Heme/ID - empiric zosyn  for aspiration PNA, day 3 - pending resp cx  Endo - 48u SSI in the last 24h, will start 25 QAM semglee  and sch 3q6h of novolog , continue SSI - discuss changing TF formulation with RD  PPX - PPI - LMWH 30BID  Plan - ICU, possible OR in AM, resp cx   Critical Care Total Time: 80 minutes  Dreama GEANNIE Hanger, MD Trauma & General Surgery Please use [email or web address] to contact on call provider  06/21/2024  *Care during the described time interval was provided by me. I have reviewed this patient's available data, including medical history, events of note, physical examination and test results as part of my evaluation.

## 2024-06-21 NOTE — Progress Notes (Addendum)
 CLinical update provided to patient's wife and daughter via phone. Explained clinical decline since admission to include aspiration event and intubation as well as MSOF today evidenced by septic shock, respiratory failure, oliguria, new cardiac arrhythmia. We discussed the possibility of continued decline and cardiac arrest. Wife reports she and the patient have had prior discussions and CPR would not be in line with his wishes. For now, she is okay with vasopressors, though I did explain that his requirements have been increasing over the course of the day. We discussed his new AF and the possibility of cardioversion. She is okay with cardioversion in this context, but is not agreeable to defibrillation in the setting of cardiac arrest. In light of his oliguria refractory to diuretics, I also explored the possibility of RRT. As he has CKD at baseline, she has been with him to his nephrology appointments and he has clearly expressed a firm desire not to proceed with HD. I explained that we would not be able to predict the nature of RRT-short or long term and she verbalized understanding. She would be open to considering a time limited trail of RRT depending on the clinical scenario and we discussed that he currently does not have an indication for RRT and this could be revisited should this change. I expressed my concerns regarding stability/appropriateness for spine surgery tomorrow and she shares these concerns. I informed her that a final decision would be made tomorrow depending on his clinical state, but based on his course over the day today, I am leaning away from clearing him for surgery tomorrow. She verbalized understanding and agreement. I concluded the conversation with a summary statement of my concerns regarding his overall clinical condition being poor and stepwise decline in the short time since admission. She was appropriately tearful and seems to understand the gravity of the clinical scenario.    Clinical decision-making: refractory hyperglycemia despite starting semglee  and Tf coverage, will start insulin  gtt, oliguria despite lasix  40, will give 80mg , ongoing discussions regarding AF. Increased vasopressor requirements, but some of that may be a component of septic shock rather than cardiogenic shock. Cardioversion does carry the risk of thromboembolism. He has been on chemical VTE ppx, however, risk of atrial thrombus due to AF remains high and he has a history of prior stroke.   Additional critical care time:  Dave GEANNIE Hanger, MD General and Trauma Surgery [facility]

## 2024-06-21 NOTE — Plan of Care (Signed)
   Problem: Elimination: Goal: Will not experience complications related to bowel motility Outcome: Progressing   Problem: Pain Managment: Goal: General experience of comfort will improve and/or be controlled Outcome: Progressing

## 2024-06-21 NOTE — Progress Notes (Signed)
    Providing Compassionate, Quality Care - Together   NEUROSURGERY PROGRESS NOTE     S: NAEs o/n.    O: EXAM:  BP (!) 133/43 (BP Location: Right Arm)   Pulse (!) 110   Temp 100 F (37.8 C)   Resp (!) 9   Ht 6' (1.829 m)   Wt 86.1 kg   SpO2 100%   BMI 25.74 kg/m    Intubated Follow simple commands in all extremities Eyes open to voice PERRL Collar in place   ASSESSMENT:  83 y.o. with   -C5-6, 6-7 anterior osteophyte fracture with small epidural hematoma  -T10-T11 vertical cleft fracture with disc space involvement   PLAN: -OR tmrw for stabilization of thoracic fx pending stability, NPO/tube at midnight.  -Continue collar -Call w/ questions/concerns.   Camie Pickle, [facility]

## 2024-06-21 NOTE — Progress Notes (Signed)
 eLink Physician-Brief Progress Note Patient Name: HUAN POLLOK DOB: Jun 30, 1941 MRN: [id number]   Date of Service  06/21/2024  HPI/Events of Note  83 year old male who presented after a fall with cervical and thoracic spine fractures.  Hospital course complicated by pneumonia and respiratory failure and is currently on invasive mechanical ventilation.  He was hypokalemic to 3.3 earlier in the day and was ordered 6 rounds of 10 mEq potassium.  After first bag, potassium was 4.8. He was also started on insulin  drip for hyperglycemia but glucose continues to rise.  Currently on 32 units an hour of insulin .  RN switched IV access for insulin  now most recent CBG is in the 300s.  eICU Interventions  Patient's chart reviewed.  Pertinent labs and imaging studies reviewed.  Case discussed with RN. Discontinue potassium runs.  Will check renal function panel to see if patient needs further potassium replacement. With most recent CBG coming down, insulin  infusion might not have been getting to patient.  Will check a blood glucose at 11:00 and if it continues to decline, we will use just the IV insulin  to control blood glucose.  If it is still rising, we will add subcutaneous insulin  to achieve glycemic control. Plan discussed in detail with bedside RN.     Intervention Category Major Interventions: Hyperglycemia - active titration of insulin  therapy;Electrolyte abnormality - evaluation and management  Jerilynn Berg 06/21/2024, 10:07 PM

## 2024-06-21 NOTE — TOC Progression Note (Signed)
 Transition of Care [facility]) - Progression Note    Patient Details  Name: Dave Wright MRN: [id number] Date of Birth: [birth year]  Transition of Care [facility]) CM/SW Contact  Paraskevi Funez E Keary Waterson, LCSW Phone Number: 06/21/2024, 1:38 PM  Clinical Narrative:    TOC continues to follow.    Expected Discharge Plan: IP Rehab Facility Barriers to Discharge: Continued Medical Work up               Expected Discharge Plan and Services       Living arrangements for the past 2 months: Single Family Home                                       Social Drivers of Health (SDOH) Interventions SDOH Screenings   Food Insecurity: No Food Insecurity (06/17/2024)  Housing: Low Risk  (06/17/2024)  Transportation Needs: No Transportation Needs (06/17/2024)  Utilities: Not At Risk (06/17/2024)  Depression (PHQ2-9): Medium Risk (03/08/2024)  Financial Resource Strain: Low Risk  (05/31/2024)  Physical Activity: Insufficiently Active (05/31/2024)  Social Connections: Moderately Isolated (06/17/2024)  Stress: No Stress Concern Present (05/31/2024)  Tobacco Use: Medium Risk (06/15/2024)    Readmission Risk Interventions     No data to display

## 2024-06-21 NOTE — Inpatient Diabetes Management (Signed)
 Inpatient Diabetes Program Recommendations  AACE/ADA: New Consensus Statement on Inpatient Glycemic Control (2025)  Target Ranges:  Prepandial:   less than 140 mg/dL      Peak postprandial:   less than 180 mg/dL (1-2 hours)      Critically ill patients:  140 - 180 mg/dL   Lab Results  Component Value Date   GLUCAP 320 (H) 06/21/2024   HGBA1C 5.5 06/07/2024   HGBA1C 5.5 06/07/2024   HGBA1C 5.5 (A) 06/07/2024   HGBA1C 5.5 06/07/2024    Review of Glycemic Control  Latest Reference Range & Units 06/20/24 23:32 06/21/24 03:29 06/21/24 07:27  Glucose-Capillary 70 - 99 mg/dL 706 (H) 711 (H) 679 (H)   Diabetes history: None Current orders for Inpatient glycemic control:  Osmolite 65 ml/hr Novolog  3 units q 4 hours (06/21/24) Semglee  25 units daily (06/21/24)  Inpatient Diabetes Program Recommendations:    If CBG's remain >180 mg/dL, consider IV insulin .   Thanks,  Randall Bullocks, RN, BC-ADM Inpatient Diabetes Coordinator Pager [phone]  (8a-5p)

## 2024-06-21 NOTE — Progress Notes (Signed)
 PT Cancellation Note  Patient Details Name: Dave Wright MRN: [id number] DOB: 1941-09-18   Cancelled Treatment:    Reason Eval/Treat Not Completed: Medical issues which prohibited therapy  Intubated over the weekend. Spoke with RN this morning, pt being transferred to 4N, plan for OR tomorrow for surgery. Will follow acutely.   Leontine Roads, PT, DPT [facility]  Rehabilitation Services Physical Therapist Office: [phone] Website: [email or web address]   Leontine GORMAN Roads 06/21/2024, 9:18 AM
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
## 2024-06-22 ENCOUNTER — Inpatient Hospital Stay (HOSPITAL_COMMUNITY): Admitting: Certified Registered Nurse Anesthetist

## 2024-06-22 ENCOUNTER — Inpatient Hospital Stay (HOSPITAL_COMMUNITY)

## 2024-06-22 ENCOUNTER — Encounter (HOSPITAL_COMMUNITY): Admission: EM | Disposition: E | Payer: Self-pay | Source: Home / Self Care

## 2024-06-22 ENCOUNTER — Other Ambulatory Visit: Payer: Self-pay

## 2024-06-22 DIAGNOSIS — S22080A Wedge compression fracture of T11-T12 vertebra, initial encounter for closed fracture: Secondary | ICD-10-CM | POA: Diagnosis not present

## 2024-06-22 DIAGNOSIS — Z87891 Personal history of nicotine dependence: Secondary | ICD-10-CM

## 2024-06-22 DIAGNOSIS — E119 Type 2 diabetes mellitus without complications: Secondary | ICD-10-CM

## 2024-06-22 DIAGNOSIS — A419 Sepsis, unspecified organism: Secondary | ICD-10-CM | POA: Diagnosis not present

## 2024-06-22 DIAGNOSIS — J9601 Acute respiratory failure with hypoxia: Secondary | ICD-10-CM | POA: Diagnosis not present

## 2024-06-22 DIAGNOSIS — I10 Essential (primary) hypertension: Secondary | ICD-10-CM | POA: Diagnosis not present

## 2024-06-22 DIAGNOSIS — R9431 Abnormal electrocardiogram [ECG] [EKG]: Secondary | ICD-10-CM | POA: Diagnosis not present

## 2024-06-22 DIAGNOSIS — J9602 Acute respiratory failure with hypercapnia: Secondary | ICD-10-CM | POA: Diagnosis not present

## 2024-06-22 LAB — GLUCOSE, CAPILLARY
Glucose-Capillary: 112 mg/dL — ABNORMAL HIGH (ref 70–99)
Glucose-Capillary: 132 mg/dL — ABNORMAL HIGH (ref 70–99)
Glucose-Capillary: 133 mg/dL — ABNORMAL HIGH (ref 70–99)
Glucose-Capillary: 134 mg/dL — ABNORMAL HIGH (ref 70–99)
Glucose-Capillary: 136 mg/dL — ABNORMAL HIGH (ref 70–99)
Glucose-Capillary: 138 mg/dL — ABNORMAL HIGH (ref 70–99)
Glucose-Capillary: 140 mg/dL — ABNORMAL HIGH (ref 70–99)
Glucose-Capillary: 146 mg/dL — ABNORMAL HIGH (ref 70–99)
Glucose-Capillary: 151 mg/dL — ABNORMAL HIGH (ref 70–99)
Glucose-Capillary: 152 mg/dL — ABNORMAL HIGH (ref 70–99)
Glucose-Capillary: 161 mg/dL — ABNORMAL HIGH (ref 70–99)
Glucose-Capillary: 175 mg/dL — ABNORMAL HIGH (ref 70–99)
Glucose-Capillary: 185 mg/dL — ABNORMAL HIGH (ref 70–99)
Glucose-Capillary: 189 mg/dL — ABNORMAL HIGH (ref 70–99)
Glucose-Capillary: 193 mg/dL — ABNORMAL HIGH (ref 70–99)
Glucose-Capillary: 193 mg/dL — ABNORMAL HIGH (ref 70–99)
Glucose-Capillary: 234 mg/dL — ABNORMAL HIGH (ref 70–99)
Glucose-Capillary: 240 mg/dL — ABNORMAL HIGH (ref 70–99)
Glucose-Capillary: 259 mg/dL — ABNORMAL HIGH (ref 70–99)
Glucose-Capillary: 97 mg/dL (ref 70–99)

## 2024-06-22 LAB — BASIC METABOLIC PANEL WITH GFR
Anion gap: 10 (ref 5–15)
Anion gap: 8 (ref 5–15)
BUN: 104 mg/dL — ABNORMAL HIGH (ref 8–23)
BUN: 107 mg/dL — ABNORMAL HIGH (ref 8–23)
CO2: 22 mmol/L (ref 22–32)
CO2: 23 mmol/L (ref 22–32)
Calcium: 9.3 mg/dL (ref 8.9–10.3)
Calcium: 8.6 mg/dL — ABNORMAL LOW (ref 8.9–10.3)
Chloride: 110 mmol/L (ref 98–111)
Chloride: 108 mmol/L (ref 98–111)
Creatinine, Ser: 2.98 mg/dL — ABNORMAL HIGH (ref 0.61–1.24)
Creatinine, Ser: 3.09 mg/dL — ABNORMAL HIGH (ref 0.61–1.24)
GFR, Estimated: 20 mL/min — ABNORMAL LOW (ref >60)
GFR, Estimated: 19 mL/min — ABNORMAL LOW (ref >60)
Glucose, Bld: 149 mg/dL — ABNORMAL HIGH (ref 70–99)
Glucose, Bld: 171 mg/dL — ABNORMAL HIGH (ref 70–99)
Potassium: 4.2 mmol/L (ref 3.5–5.1)
Potassium: 5.8 mmol/L — ABNORMAL HIGH (ref 3.5–5.1)
Sodium: 142 mmol/L (ref 135–145)
Sodium: 139 mmol/L (ref 135–145)

## 2024-06-22 LAB — CBC
HCT: 24.1 % — ABNORMAL LOW (ref 39.0–52.0)
HCT: 23.9 % — ABNORMAL LOW (ref 39.0–52.0)
Hemoglobin: 7.4 g/dL — ABNORMAL LOW (ref 13.0–17.0)
Hemoglobin: 7.3 g/dL — ABNORMAL LOW (ref 13.0–17.0)
MCH: 31.8 pg (ref 26.0–34.0)
MCH: 31.9 pg (ref 26.0–34.0)
MCHC: 30.7 g/dL (ref 30.0–36.0)
MCHC: 30.5 g/dL (ref 30.0–36.0)
MCV: 103.4 fL — ABNORMAL HIGH (ref 80.0–100.0)
MCV: 104.4 fL — ABNORMAL HIGH (ref 80.0–100.0)
Platelets: 89 K/uL — ABNORMAL LOW (ref 150–400)
Platelets: 95 K/uL — ABNORMAL LOW (ref 150–400)
RBC: 2.33 MIL/uL — ABNORMAL LOW (ref 4.22–5.81)
RBC: 2.29 MIL/uL — ABNORMAL LOW (ref 4.22–5.81)
RDW: 16.5 % — ABNORMAL HIGH (ref 11.5–15.5)
RDW: 16.4 % — ABNORMAL HIGH (ref 11.5–15.5)
WBC: 6.3 K/uL (ref 4.0–10.5)
WBC: 7.9 K/uL (ref 4.0–10.5)
nRBC: 0.3 % — ABNORMAL HIGH (ref 0.0–0.2)
nRBC: 0.3 % — ABNORMAL HIGH (ref 0.0–0.2)

## 2024-06-22 LAB — HEPATIC FUNCTION PANEL
ALT: 21 U/L (ref 0–44)
AST: 24 U/L (ref 15–41)
Albumin: 1.8 g/dL — ABNORMAL LOW (ref 3.5–5.0)
Alkaline Phosphatase: 38 U/L (ref 38–126)
Bilirubin, Direct: 0.5 mg/dL — ABNORMAL HIGH (ref 0.0–0.2)
Indirect Bilirubin: 0.7 mg/dL (ref 0.3–0.9)
Total Bilirubin: 1.2 mg/dL (ref 0.0–1.2)
Total Protein: 4.4 g/dL — ABNORMAL LOW (ref 6.5–8.1)

## 2024-06-22 LAB — CULTURE, RESPIRATORY W GRAM STAIN: Culture: NORMAL

## 2024-06-22 LAB — TYPE AND SCREEN
ABO/RH(D): O POS
Antibody Screen: NEGATIVE

## 2024-06-22 LAB — PROTIME-INR
INR: 1.2 (ref 0.8–1.2)
Prothrombin Time: 16.3 s — ABNORMAL HIGH (ref 11.4–15.2)

## 2024-06-22 LAB — PREPARE RBC (CROSSMATCH)

## 2024-06-22 SURGERY — POSTERIOR THORACIC FUSION 2 LEVEL
Anesthesia: General

## 2024-06-22 MED ORDER — BUPIVACAINE HCL (PF) 0.5 % IJ SOLN
INTRAMUSCULAR | Status: AC
  Filled 2024-06-22: qty 30

## 2024-06-22 MED ORDER — ROCURONIUM BROMIDE 10 MG/ML (PF) SYRINGE
PREFILLED_SYRINGE | INTRAVENOUS | Status: AC
  Filled 2024-06-22: qty 10

## 2024-06-22 MED ORDER — LIDOCAINE-EPINEPHRINE 1 %-1:100000 IJ SOLN
INTRAMUSCULAR | Status: DC | PRN
  Administered 2024-06-22: 3 mL
  Administered 2024-06-22: 20 mL via INTRADERMAL

## 2024-06-22 MED ORDER — CEFAZOLIN SODIUM-DEXTROSE 2-4 GM/100ML-% IV SOLN: 2.0000 g | Freq: Four times a day (QID) | INTRAVENOUS | Status: DC

## 2024-06-22 MED ORDER — CEFAZOLIN SODIUM 1 G IJ SOLR
INTRAMUSCULAR | Status: AC
  Filled 2024-06-22: qty 20

## 2024-06-22 MED ORDER — LIDOCAINE-EPINEPHRINE 1 %-1:100000 IJ SOLN
INTRAMUSCULAR | Status: AC
  Filled 2024-06-22: qty 1

## 2024-06-22 MED ORDER — CEFAZOLIN SODIUM-DEXTROSE 2-3 GM-%(50ML) IV SOLR
INTRAVENOUS | Status: DC | PRN
  Administered 2024-06-22: 2 g via INTRAVENOUS

## 2024-06-22 MED ORDER — PROPOFOL 10 MG/ML IV BOLUS
INTRAVENOUS | Status: AC
  Filled 2024-06-22: qty 20

## 2024-06-22 MED ORDER — EPHEDRINE 5 MG/ML INJ
INTRAVENOUS | Status: AC
  Filled 2024-06-22: qty 5

## 2024-06-22 MED ORDER — SODIUM CHLORIDE 0.9 % IV SOLN: 150.0000 mg | INTRAVENOUS | Status: DC

## 2024-06-22 MED ORDER — LIDOCAINE 2% (20 MG/ML) 5 ML SYRINGE
INTRAMUSCULAR | Status: AC
Start: 2024-06-22 — End: 2024-06-22
  Filled 2024-06-22: qty 5

## 2024-06-22 MED ORDER — ROCURONIUM BROMIDE 10 MG/ML (PF) SYRINGE
PREFILLED_SYRINGE | INTRAVENOUS | Status: DC | PRN
  Administered 2024-06-22: 30 mg via INTRAVENOUS
  Administered 2024-06-22: 20 mg via INTRAVENOUS
  Administered 2024-06-22: 50 mg via INTRAVENOUS
  Administered 2024-06-22: 20 mg via INTRAVENOUS

## 2024-06-22 MED ORDER — 0.9 % SODIUM CHLORIDE (POUR BTL) OPTIME
TOPICAL | Status: DC | PRN
  Administered 2024-06-22: 1000 mL

## 2024-06-22 MED ORDER — BUPIVACAINE HCL (PF) 0.5 % IJ SOLN
INTRAMUSCULAR | Status: DC | PRN
  Administered 2024-06-22: 3 mL
  Administered 2024-06-22: 30 mL

## 2024-06-22 MED ORDER — THROMBIN 5000 UNITS EX SOLR
OROMUCOSAL | Status: DC | PRN
  Administered 2024-06-22: 5 mL via TOPICAL

## 2024-06-22 MED ORDER — PHENYLEPHRINE 80 MCG/ML (10ML) SYRINGE FOR IV PUSH (FOR BLOOD PRESSURE SUPPORT)
PREFILLED_SYRINGE | INTRAVENOUS | Status: AC
  Filled 2024-06-22: qty 10

## 2024-06-22 MED ORDER — THROMBIN 5000 UNITS EX KIT
PACK | CUTANEOUS | Status: AC
  Filled 2024-06-22: qty 1

## 2024-06-22 MED ORDER — OSMOLITE 1.2 CAL PO LIQD
1000.0000 mL | ORAL | Status: DC
  Administered 2024-06-22 – 2024-06-25 (×5): 1000 mL
  Filled 2024-06-22 (×5): qty 1000

## 2024-06-22 MED ORDER — HEPARIN SODIUM (PORCINE) 5000 UNIT/ML IJ SOLN: 5000.0000 [IU] | Freq: Three times a day (TID) | INTRAMUSCULAR | Status: DC

## 2024-06-22 MED ORDER — ONDANSETRON HCL 4 MG/2ML IJ SOLN
INTRAMUSCULAR | Status: AC
Start: 2024-06-22 — End: 2024-06-22
  Filled 2024-06-22: qty 2

## 2024-06-22 MED ORDER — HEPARIN SODIUM (PORCINE) 5000 UNIT/ML IJ SOLN
5000.0000 [IU] | Freq: Three times a day (TID) | INTRAMUSCULAR | Status: DC
  Administered 2024-06-22 – 2024-06-25 (×8): 5000 [IU] via SUBCUTANEOUS
  Filled 2024-06-22 (×9): qty 1

## 2024-06-22 MED ORDER — DEXAMETHASONE SODIUM PHOSPHATE 10 MG/ML IJ SOLN
INTRAMUSCULAR | Status: DC | PRN
  Administered 2024-06-22: 10 mg via INTRAVENOUS

## 2024-06-22 MED ORDER — PHENYLEPHRINE 80 MCG/ML (10ML) SYRINGE FOR IV PUSH (FOR BLOOD PRESSURE SUPPORT)
PREFILLED_SYRINGE | INTRAVENOUS | Status: DC | PRN
  Administered 2024-06-22 (×2): 80 ug via INTRAVENOUS

## 2024-06-22 MED ORDER — ROCURONIUM BROMIDE 10 MG/ML (PF) SYRINGE
PREFILLED_SYRINGE | INTRAVENOUS | Status: AC
  Filled 2024-06-22: qty 20

## 2024-06-22 SURGICAL SUPPLY — 52 items
BAG COUNTER SPONGE SURGICOUNT (BAG) ×1 IMPLANT
BLADE CLIPPER SURG (BLADE) IMPLANT
BUR 14 MATCH 3 (BUR) IMPLANT
BUR MR8 14 BALL 5 (BUR) IMPLANT
CANISTER SUCTION 3000ML PPV (SUCTIONS) ×1 IMPLANT
DERMABOND ADVANCED .7 DNX12 (GAUZE/BANDAGES/DRESSINGS) IMPLANT
DRAPE C-ARM 42X72 X-RAY (DRAPES) ×2 IMPLANT
DRAPE LAPAROTOMY 100X72X124 (DRAPES) ×1 IMPLANT
DRAPE SHEET LG 3/4 BI-LAMINATE (DRAPES) ×4 IMPLANT
DRAPE SURG 17X23 STRL (DRAPES) ×1 IMPLANT
DRSG OPSITE POSTOP 3X4 (GAUZE/BANDAGES/DRESSINGS) IMPLANT
DRSG OPSITE POSTOP 4X6 (GAUZE/BANDAGES/DRESSINGS) IMPLANT
DURAPREP 26ML APPLICATOR (WOUND CARE) ×1 IMPLANT
ELECTRODE BLDE INSULATED 6.5IN (ELECTROSURGICAL) ×1 IMPLANT
ELECTRODE REM PT RTRN 9FT ADLT (ELECTROSURGICAL) ×1 IMPLANT
EXTENDER TAB GUIDE SV 5.5/6.0 (INSTRUMENTS) IMPLANT
FEE COVERAGE SUPPORT O-ARM (MISCELLANEOUS) ×1 IMPLANT
GAUZE 4X4 16PLY ~~LOC~~+RFID DBL (SPONGE) IMPLANT
GAUZE SPONGE 4X4 12PLY STRL (GAUZE/BANDAGES/DRESSINGS) IMPLANT
GLOVE BIO SURGEON STRL SZ7 (GLOVE) ×4 IMPLANT
GLOVE BIOGEL PI IND STRL 7.5 (GLOVE) ×2 IMPLANT
GLOVE BIOGEL PI IND STRL 8 (GLOVE) ×4 IMPLANT
GLOVE ECLIPSE 8.0 STRL XLNG CF (GLOVE) ×1 IMPLANT
GLOVE SURG SS PI 7.0 STRL IVOR (GLOVE) IMPLANT
GOWN STRL REUS W/ TWL LRG LVL3 (GOWN DISPOSABLE) ×2 IMPLANT
GOWN STRL REUS W/ TWL XL LVL3 (GOWN DISPOSABLE) ×2 IMPLANT
GOWN STRL REUS W/TWL 2XL LVL3 (GOWN DISPOSABLE) IMPLANT
HEMOSTAT POWDER KIT SURGIFOAM (HEMOSTASIS) ×1 IMPLANT
KIT BASIN OR (CUSTOM PROCEDURE TRAY) ×1 IMPLANT
KIT TURNOVER KIT B (KITS) ×1 IMPLANT
MARKER SPHERE PSV REFLC NDI (MISCELLANEOUS) ×5 IMPLANT
NDL HYPO 18GX1.5 BLUNT FILL (NEEDLE) IMPLANT
NDL HYPO 22X1.5 SAFETY MO (MISCELLANEOUS) ×1 IMPLANT
NEEDLE HYPO 18GX1.5 BLUNT FILL (NEEDLE) IMPLANT
NEEDLE HYPO 22X1.5 SAFETY MO (MISCELLANEOUS) ×1 IMPLANT
NS IRRIG 1000ML POUR BTL (IV SOLUTION) ×1 IMPLANT
PACK LAMINECTOMY NEURO (CUSTOM PROCEDURE TRAY) ×1 IMPLANT
PAD ARMBOARD POSITIONER FOAM (MISCELLANEOUS) ×3 IMPLANT
PIN BONE FIX 100 (PIN) IMPLANT
ROD PERC TI STRT 5.5X80 (Rod) IMPLANT
SCREW MAS VOYAGER 5.5X50 (Screw) IMPLANT
SCREW MAS VOYAGER 5.5X55 (Screw) IMPLANT
SCREW SET 5.5/6.0MM SOLERA (Screw) IMPLANT
SPIKE FLUID TRANSFER (MISCELLANEOUS) ×1 IMPLANT
SPONGE SURGIFOAM ABS GEL SZ50 (HEMOSTASIS) IMPLANT
SPONGE T-LAP 4X18 ~~LOC~~+RFID (SPONGE) IMPLANT
SUT MNCRL AB 4-0 PS2 18 (SUTURE) ×1 IMPLANT
SUT VIC AB 0 CT1 18XCR BRD8 (SUTURE) IMPLANT
SUT VIC AB 2-0 CP2 18 (SUTURE) ×1 IMPLANT
TOWEL GREEN STERILE (TOWEL DISPOSABLE) ×1 IMPLANT
TOWEL GREEN STERILE FF (TOWEL DISPOSABLE) ×1 IMPLANT
WATER STERILE IRR 1000ML POUR (IV SOLUTION) ×1 IMPLANT
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
## 2024-06-22 NOTE — Transfer of Care (Signed)
 Immediate Anesthesia Transfer of Care Note  Patient: Dave Wright  Procedure(s) Performed: POSTERIOR THORACIC FUSION THORACIC TEN -TWELVE PERCUTANEOUS INSTRUMENTATION ELEVEN OPEN REDUCTION OF THORACIC ELEVEN FRACTURE APPLICATION OF O-ARM  Patient Location: SICU  Anesthesia Type:General  Level of Consciousness: sedated and Patient remains intubated per anesthesia plan  Airway & Oxygen Therapy: Patient placed on Ventilator (see vital sign flow sheet for setting)  Post-op Assessment: Report given to RN and Post -op Vital signs reviewed and stable  Post vital signs: Reviewed and stable  Last Vitals:  Vitals Value Taken Time  BP 106/33   Temp 36.3   Pulse 86   Resp 18   SpO2 100     Last Pain:  Vitals:   06/22/24 0800  TempSrc: Bladder  PainSc:       Patients Stated Pain Goal: 1 (06/19/24 0000)  Complications: There were no known notable events for this encounter.

## 2024-06-22 NOTE — Progress Notes (Signed)
 Subjective: NAEs, off pressors  Objective: Vital signs in last 24 hours: Temp:  [96.8 F (36 C)-100.6 F (38.1 C)] 97.5 F (36.4 C) (08/05 0800) Pulse Rate:  [60-144] 96 (08/05 0800) Resp:  [9-27] 17 (08/05 0800) BP: (102-112)/(36-55) 112/36 (08/05 0730) SpO2:  [84 %-100 %] 100 % (08/05 0800) Arterial Line BP: (81-151)/(20-54) 133/31 (08/05 0800) FiO2 (%):  [40 %] 40 % (08/05 0730) Weight:  [95.6 kg] 95.6 kg (08/05 0500)  Intake/Output from previous day: 08/04 0701 - 08/05 0700 In: 4998.4 [I.V.:3116.5; NG/GT:1305; IV Piggyback:576.9] Out: 1289 [Urine:1287; Stool:2] Intake/Output this shift: No intake/output data recorded.  Intubated, sedated. Right arm swollen  Lab Results: Recent Labs    06/21/24 2014 06/22/24 0406  WBC 8.5 6.3  HGB 8.0* 7.4*  HCT 25.5* 24.1*  PLT 118* 89*   BMET Recent Labs    06/21/24 2209 06/22/24 0406  NA 138 142  K 4.3 4.2  CL 107 110  CO2 21* 22  GLUCOSE 406* 149*  BUN 104* 104*  CREATININE 2.91* 2.98*  CALCIUM  9.1 9.3    Studies/Results: No results found.  Assessment/Plan: 83 yo M with multiple traumatic injuries including cervical spine fracture, T10-11 disc/ALL fracture with underlying DISH  - I had a long discussion with the patient's family at the bedside.  I again explained the rationale behind instrumented fixation of his T10-11 fracture as with his underlying DISH, it will be a painful and difficulty he will fracture, and with even a small fall, a highly unstable fracture could potentially result.  He does appear to have stabilized from a medical standpoint to the point he would tolerate the surgery.  Risks, benefits, alternatives, and expected convalescence were discussed with patient's family.  Risks discussed included, but were not limited to bleeding, pain, infection, scar, spinal fluid leak, neurologic deficit, instability, pseudoarthrosis, damage to nearby organs, and death.  Informed consent was obtained.   Dave Wright 06/22/2024, 8:14 AM

## 2024-06-22 NOTE — Anesthesia Postprocedure Evaluation (Signed)
 Anesthesia Post Note  Patient: Dave Wright  Procedure(s) Performed: POSTERIOR THORACIC FUSION THORACIC TEN -TWELVE PERCUTANEOUS INSTRUMENTATION ELEVEN OPEN REDUCTION OF THORACIC ELEVEN FRACTURE APPLICATION OF O-ARM     Patient location during evaluation: SICU Anesthesia Type: General Level of consciousness: sedated Pain management: pain level controlled Vital Signs Assessment: post-procedure vital signs reviewed and stable Respiratory status: patient remains intubated per anesthesia plan Cardiovascular status: stable Postop Assessment: no apparent nausea or vomiting Anesthetic complications: no   There were no known notable events for this encounter.  Last Vitals:  Vitals:   06/22/24 1655 06/22/24 1700  BP:    Pulse: 82 84  Resp: 18 18  Temp: (!) 36.1 C (!) 36 C  SpO2: 100% 100%    Last Pain:  Vitals:   06/22/24 1200  TempSrc: Bladder  PainSc:                  Bernardino SQUIBB AmeLie Hollars

## 2024-06-22 NOTE — Op Note (Signed)
 Procedure(s): POSTERIOR THORACIC FUSION THORACIC TEN -TWELVE PERCUTANEOUS INSTRUMENTATION ELEVEN OPEN REDUCTION OF THORACIC ELEVEN FRACTURE APPLICATION OF O-ARM Procedure Note  Dave Wright male 83 y.o. 06/22/2024  Procedure(s) and Anesthesia Type:    * POSTERIOR THORACIC FUSION THORACIC TEN -TWELVE PERCUTANEOUS INSTRUMENTATION ELEVEN OPEN REDUCTION OF THORACIC ELEVEN FRACTURE - General    * APPLICATION OF O-ARM - General  Surgeons and Role:    DEWAINE Ned, Dorn MATSU, MD - Primary   Indications: This is a 83 year old man with atrial fibrillation and DISH who suffered a fall with a rib fracture, cervical spine fracture as well as a hyperextension injury at T10-11 with fracture through the T10-11 ALL and disc as well as a coronally oriented fracture through the T11 body.  There was anterior to space widening.  Given his underlying DISH, this appeared to be an unstable fracture.  I discussed treatment options with the patient and wife and they wish to proceed with percutaneous fixation once his Plavix  effect had run its course.  He unfortunately developed aspiration pneumonia and required intubation but he appeared medically stable enough for stabilization surgery.  Risks, benefits, alternatives, and expected convalescence were discussed with them.  Risks discussed included, but were not limited to bleeding, pain, infection, scar, spinal fluid leak, neurologic deficit, instability, pseudoarthrosis, damage to nearby organs, and death.  Informed consent was obtained.        Surgeon: Dorn MATSU Ned   Assistants: Camie Pickle, PA.  Please note no qualified trainees were available to assist with the procedure.  Assistance was required through the entirety of the procedure.   Anesthesia: General endotracheal anesthesia   Procedure Detail  Open reduction of T11-12 hyperextension fracture POSTERIOR THORACIC FUSION THORACIC TEN -TWELVE PERCUTANEOUS fusion 3. Segmental instrumentation with  pedicle screw/rod construct T10-11-12     The patient brought to the op room.  After appropriate lines and monitors were placed, patient positioned prone on the open [place] table with all pressure points padded and eyes protected.  His back was preprepped with alcohol  and prepped and draped in sterile fashion.  A timeout was performed.  Preoperative antibiotics was administered.  Small stab incision was made and PSIS pin was placed, and intraoperative CT obtained to allow for neuronavigation.  Using the Stealth navigation, paramedian incisions were planned for pedicle screw trajectories at T10, T11, and T12.  Incisions were made with a 10 blade.  Navigated high speed drill was used to cannulate the pedicles, and pedicles were then tapped using navigation.  Ball ended feeler confirmed good cannulation with no breaches.  Appropriately sized pedicle screws were placed using navigation with good purchase.   T10-11 and T11-12 facets were already partially fused but were decorticated for more robust fusion.  Rods were then passed through the screw towers bilaterally and secured with screw caps, with slight compression to allow reduction of hyperextension injury.  AP and lateral X-ray confirmed good placement of instrumentation and good reduction of the fracture.  The screw caps were final tightened and the towers removed.  Wounds were irrigated thoroughly .  The fascia was closed with 0 Vicryl stitches.  The dermal layer was closed with 2-0 Vicryl stitches in buried interrupted fashion.  The skin incisions were closed with 4-0 Monocryl subcuticular manner followed by Dermabond.  The PSIS pin was removed and small incision closed with 3-0 vicryl in buried fashion and Dermabond.  Sterile dressings were placed.  Patient was then flipped supine and extubated by the anesthesia service following commands  and all 4 extremities.  All counts were correct at the end of surgery.  No complications were noted.     Findings: T10-11 hyperextension fracture  Estimated Blood Loss: 25 mL         Drains: None         Total IV Fluids: See anesthesia records  Blood Given: none          Specimens: None         Implants:  [employer] 5.5 x 55 mm screws x 2 at T10, T11, T12 bilaterally        Complications:  * No complications entered in OR log *         Disposition: PACU - hemodynamically stable.         Condition: stable

## 2024-06-22 NOTE — Progress Notes (Signed)
 Nutrition Follow-up  DOCUMENTATION CODES:   Non-severe (moderate) malnutrition in context of chronic illness  INTERVENTION:  Resume TF via Cortrak post OR: Change formula to Osmolite 1.2 at 39ml/hr ( per day) 60ml ProSource TF 20 BID Provides 2032 kcal, 127g protein, free water , 246g carbohydrate  NUTRITION DIAGNOSIS:   Moderate Malnutrition related to chronic illness as evidenced by moderate muscle depletion, moderate fat depletion, severe muscle depletion. - remains applicable  GOAL:   Patient will meet greater than or equal to 90% of their needs - goal met via TF  MONITOR:   TF tolerance, Diet advancement, Labs, Weight trends, Skin  REASON FOR ASSESSMENT:   Consult Enteral/tube feeding initiation and management  ASSESSMENT:   83 yo male admitted post mechanical GLF (fell backwards onto concrete floor) with C-Spine and T-spine fractures in addition to pneumothorax, L rib fracture. 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  Plan for OR early afternoon for fixation of T10-T11 fracture. TF held for procedure.   Pt started on insulin  drip yesterday d/t hyperglycemia.  MD inquiring about adjusting TF regimen to aid in glycemic control.   Will adjust to a less concentrated formula to aid in reduction of daily carbohydrate to determine if this will aid in better glycemic control. Currently receiving 318g per day. New TF formula will now provide 246g per day.  DM coordinator recommendation today- consider continuing IV insulin  during surgery and wait to transition off after surgery.   Pt notably with increasing BUN/creatinine. MD discussed with spouse. Wife would consider a  limited trial of RRT depending on the clinical scenario, however per MD note yesterday no current indication for RRT at this time.   Now having multiple BM's after bowel regimen escalated yesterday.   UOP: x24 hours + x6 hours  Admit weight: 86.6 kg Current weight: 95.6 kg   Medications: colace BID, MVI, miralax  BID, thiamine  Drips: D5 in LR @ 160ml/hr Insulin  gtt Abx  Labs:  BUN 104 Cr 2.98 Corrected calcium  11.06 Albumin 1.8 GFR 20  Diet Order:   Diet Order             Diet NPO time specified  Diet effective midnight                   EDUCATION NEEDS:   Education needs have been addressed  Skin:  Skin Assessment: Skin Integrity Issues: Skin Integrity Issues:: DTI DTI: bilateral buttock/sacral area (images in chart)-chronic in nature per wife (previously documented as hematoma) Other: scalp laceration  Last BM:  8/5 type 7 x2  Height:   Ht Readings from Last 1 Encounters:  06/21/24 6' (1.829 m)    Weight:   Wt Readings from Last 1 Encounters:  06/22/24 95.6 kg   BMI:  Body mass index is 28.58 kg/m.  Estimated Nutritional Needs:   Kcal:  1900-2100 kcals  Protein:  115-130 g  Fluid:  >/= 1.9 L  Royce Maris, RDN, LDN Clinical Nutrition See AMiON for contact information.

## 2024-06-22 NOTE — Inpatient Diabetes Management (Signed)
 Inpatient Diabetes Program Recommendations  AACE/ADA: New Consensus Statement on Inpatient Glycemic Control (2025)  Target Ranges:  Prepandial:   less than 140 mg/dL      Peak postprandial:   less than 180 mg/dL (1-2 hours)      Critically ill patients:  140 - 180 mg/dL   Lab Results  Component Value Date   GLUCAP 138 (H) 06/22/2024   HGBA1C 5.5 06/07/2024   HGBA1C 5.5 06/07/2024   HGBA1C 5.5 (A) 06/07/2024   HGBA1C 5.5 06/07/2024    Review of Glycemic Control  Latest Reference Range & Units 06/22/24 04:59 06/22/24 06:56 06/22/24 08:01 06/22/24 09:10 06/22/24 10:09  Glucose-Capillary 70 - 99 mg/dL 867 (H) 97 887 (H) 853 (H) 138 (H)   Diabetes history: None Current orders for Inpatient glycemic control:  IV insulin  Tube feeds currently on hold Inpatient Diabetes Program Recommendations:   Note plans for surgery.  Consider continuing IV insulin  during surgery and wait to transition after surgery.   Thanks,  Randall Bullocks, RN, BC-ADM Inpatient Diabetes Coordinator Pager [phone]  (8a-5p)

## 2024-06-22 NOTE — Progress Notes (Signed)
 Trauma/Critical Care Follow Up Note  Subjective:    Overnight Issues:   Objective:  Vital signs for last 24 hours: Temp:  [96.8 F (36 C)-100.6 F (38.1 C)] 97.7 F (36.5 C) (08/05 0615) Pulse Rate:  [60-144] 113 (08/05 0615) Resp:  [9-27] 18 (08/05 0615) BP: (102-133)/(36-55) 112/36 (08/05 0730) SpO2:  [84 %-100 %] 100 % (08/05 0615) Arterial Line BP: (81-151)/(20-54) 100/33 (08/05 0615) FiO2 (%):  [40 %] 40 % (08/05 0730) Weight:  [95.6 kg] 95.6 kg (08/05 0500)  Hemodynamic parameters for last 24 hours:    Intake/Output from previous day: 08/04 0701 - 08/05 0700 In: 4846.5 [I.V.:2975.3; NG/GT:1305; IV Piggyback:566.2] Out: 1289 [Urine:1287; Stool:2]  Intake/Output this shift: No intake/output data recorded.  Vent settings for last 24 hours: Vent Mode: PRVC FiO2 (%):  [40 %] 40 % Set Rate:  [18 bmp] 18 bmp Vt Set:  [540 mL] 540 mL PEEP:  [5 cmH20] 5 cmH20 Pressure Support:  [8 cmH20] 8 cmH20 Plateau Pressure:  [15 cmH20-18 cmH20] 17 cmH20  Physical Exam:  Gen: comfortable, no distress Neuro: sedated on exam HEENT: PERRL Neck: c-collar in place CV: RRR Pulm: unlabored breathing on mechanical ventilation-full support Abd: soft, NT  , +BM GU: urine clear and yellow, +Foley Extr: wwp, no edema  Results for orders placed or performed during the hospital encounter of 06/15/24 (from the past 24 hours)  Glucose, capillary     Status: Abnormal   Collection Time: 06/21/24 11:09 AM  Result Value Ref Range   Glucose-Capillary 309 (H) 70 - 99 mg/dL  Glucose, capillary     Status: Abnormal   Collection Time: 06/21/24  3:10 PM  Result Value Ref Range   Glucose-Capillary 273 (H) 70 - 99 mg/dL  Basic metabolic panel     Status: Abnormal   Collection Time: 06/21/24  3:38 PM  Result Value Ref Range   Sodium 143 135 - 145 mmol/L   Potassium 3.3 (L) 3.5 - 5.1 mmol/L   Chloride 120 (H) 98 - 111 mmol/L   CO2 16 (L) 22 - 32 mmol/L   Glucose, Bld 235 (H) 70 - 99 mg/dL    BUN 78 (H) 8 - 23 mg/dL   Creatinine, Ser 7.85 (H) 0.61 - 1.24 mg/dL   Calcium  6.5 (L) 8.9 - 10.3 mg/dL   GFR, Estimated 30 (L) >60 mL/min   Anion gap 7 5 - 15  Glucose, capillary     Status: Abnormal   Collection Time: 06/21/24  4:34 PM  Result Value Ref Range   Glucose-Capillary 295 (H) 70 - 99 mg/dL  Lactic acid, plasma     Status: None   Collection Time: 06/21/24  5:19 PM  Result Value Ref Range   Lactic Acid, Venous 1.8 0.5 - 1.9 mmol/L  Glucose, capillary     Status: Abnormal   Collection Time: 06/21/24  5:45 PM  Result Value Ref Range   Glucose-Capillary 344 (H) 70 - 99 mg/dL  Glucose, capillary     Status: Abnormal   Collection Time: 06/21/24  6:50 PM  Result Value Ref Range   Glucose-Capillary 382 (H) 70 - 99 mg/dL  Glucose, capillary     Status: Abnormal   Collection Time: 06/21/24  7:22 PM  Result Value Ref Range   Glucose-Capillary 386 (H) 70 - 99 mg/dL  Basic metabolic panel     Status: Abnormal   Collection Time: 06/21/24  8:14 PM  Result Value Ref Range   Sodium 136 135 - 145  mmol/L   Potassium 4.8 3.5 - 5.1 mmol/L   Chloride 106 98 - 111 mmol/L   CO2 20 (L) 22 - 32 mmol/L   Glucose, Bld 469 (H) 70 - 99 mg/dL   BUN 896 (H) 8 - 23 mg/dL   Creatinine, Ser 7.10 (H) 0.61 - 1.24 mg/dL   Calcium  9.3 8.9 - 10.3 mg/dL   GFR, Estimated 21 (L) >60 mL/min   Anion gap 10 5 - 15  Lactic acid, plasma     Status: None   Collection Time: 06/21/24  8:14 PM  Result Value Ref Range   Lactic Acid, Venous 1.5 0.5 - 1.9 mmol/L  CBC     Status: Abnormal   Collection Time: 06/21/24  8:14 PM  Result Value Ref Range   WBC 8.5 4.0 - 10.5 K/uL   RBC 2.48 (L) 4.22 - 5.81 MIL/uL   Hemoglobin 8.0 (L) 13.0 - 17.0 g/dL   HCT 74.4 (L) 60.9 - 47.9 %   MCV 102.8 (H) 80.0 - 100.0 fL   MCH 32.3 26.0 - 34.0 pg   MCHC 31.4 30.0 - 36.0 g/dL   RDW 83.4 (H) 88.4 - 84.4 %   Platelets 118 (L) 150 - 400 K/uL   nRBC 0.0 0.0 - 0.2 %  Glucose, capillary     Status: Abnormal   Collection Time:  06/21/24  8:14 PM  Result Value Ref Range   Glucose-Capillary 437 (H) 70 - 99 mg/dL  Glucose, capillary     Status: Abnormal   Collection Time: 06/21/24  8:46 PM  Result Value Ref Range   Glucose-Capillary 446 (H) 70 - 99 mg/dL  Glucose, capillary     Status: Abnormal   Collection Time: 06/21/24  9:16 PM  Result Value Ref Range   Glucose-Capillary 420 (H) 70 - 99 mg/dL  Glucose, capillary     Status: Abnormal   Collection Time: 06/21/24  9:51 PM  Result Value Ref Range   Glucose-Capillary 372 (H) 70 - 99 mg/dL  Renal function panel     Status: Abnormal   Collection Time: 06/21/24 10:09 PM  Result Value Ref Range   Sodium 138 135 - 145 mmol/L   Potassium 4.3 3.5 - 5.1 mmol/L   Chloride 107 98 - 111 mmol/L   CO2 21 (L) 22 - 32 mmol/L   Glucose, Bld 406 (H) 70 - 99 mg/dL   BUN 895 (H) 8 - 23 mg/dL   Creatinine, Ser 7.08 (H) 0.61 - 1.24 mg/dL   Calcium  9.1 8.9 - 10.3 mg/dL   Phosphorus 3.4 2.5 - 4.6 mg/dL   Albumin 1.9 (L) 3.5 - 5.0 g/dL   GFR, Estimated 21 (L) >60 mL/min   Anion gap 10 5 - 15  Glucose, capillary     Status: Abnormal   Collection Time: 06/21/24 10:57 PM  Result Value Ref Range   Glucose-Capillary 320 (H) 70 - 99 mg/dL  Glucose, capillary     Status: Abnormal   Collection Time: 06/21/24 11:55 PM  Result Value Ref Range   Glucose-Capillary 259 (H) 70 - 99 mg/dL  Glucose, capillary     Status: Abnormal   Collection Time: 06/22/24 12:30 AM  Result Value Ref Range   Glucose-Capillary 240 (H) 70 - 99 mg/dL  Glucose, capillary     Status: Abnormal   Collection Time: 06/22/24  1:01 AM  Result Value Ref Range   Glucose-Capillary 234 (H) 70 - 99 mg/dL  Glucose, capillary     Status: Abnormal  Collection Time: 06/22/24  2:00 AM  Result Value Ref Range   Glucose-Capillary 175 (H) 70 - 99 mg/dL  Glucose, capillary     Status: Abnormal   Collection Time: 06/22/24  2:59 AM  Result Value Ref Range   Glucose-Capillary 161 (H) 70 - 99 mg/dL  Glucose, capillary      Status: Abnormal   Collection Time: 06/22/24  4:04 AM  Result Value Ref Range   Glucose-Capillary 151 (H) 70 - 99 mg/dL  CBC     Status: Abnormal   Collection Time: 06/22/24  4:06 AM  Result Value Ref Range   WBC 6.3 4.0 - 10.5 K/uL   RBC 2.33 (L) 4.22 - 5.81 MIL/uL   Hemoglobin 7.4 (L) 13.0 - 17.0 g/dL   HCT 75.8 (L) 60.9 - 47.9 %   MCV 103.4 (H) 80.0 - 100.0 fL   MCH 31.8 26.0 - 34.0 pg   MCHC 30.7 30.0 - 36.0 g/dL   RDW 83.4 (H) 88.4 - 84.4 %   Platelets 89 (L) 150 - 400 K/uL   nRBC 0.3 (H) 0.0 - 0.2 %  Protime-INR     Status: Abnormal   Collection Time: 06/22/24  4:06 AM  Result Value Ref Range   Prothrombin Time 16.3 (H) 11.4 - 15.2 seconds   INR 1.2 0.8 - 1.2  Basic metabolic panel     Status: Abnormal   Collection Time: 06/22/24  4:06 AM  Result Value Ref Range   Sodium 142 135 - 145 mmol/L   Potassium 4.2 3.5 - 5.1 mmol/L   Chloride 110 98 - 111 mmol/L   CO2 22 22 - 32 mmol/L   Glucose, Bld 149 (H) 70 - 99 mg/dL   BUN 895 (H) 8 - 23 mg/dL   Creatinine, Ser 7.01 (H) 0.61 - 1.24 mg/dL   Calcium  9.3 8.9 - 10.3 mg/dL   GFR, Estimated 20 (L) >60 mL/min   Anion gap 10 5 - 15  Hepatic function panel     Status: Abnormal   Collection Time: 06/22/24  4:11 AM  Result Value Ref Range   Total Protein 4.4 (L) 6.5 - 8.1 g/dL   Albumin 1.8 (L) 3.5 - 5.0 g/dL   AST 24 15 - 41 U/L   ALT 21 0 - 44 U/L   Alkaline Phosphatase 38 38 - 126 U/L   Total Bilirubin 1.2 0.0 - 1.2 mg/dL   Bilirubin, Direct 0.5 (H) 0.0 - 0.2 mg/dL   Indirect Bilirubin 0.7 0.3 - 0.9 mg/dL  Glucose, capillary     Status: Abnormal   Collection Time: 06/22/24  4:59 AM  Result Value Ref Range   Glucose-Capillary 132 (H) 70 - 99 mg/dL  Glucose, capillary     Status: None   Collection Time: 06/22/24  6:56 AM  Result Value Ref Range   Glucose-Capillary 97 70 - 99 mg/dL    Assessment & Plan: The plan of care was discussed with the bedside nurse for the day, who is in agreement with this plan and no  additional concerns were raised.   Present on Admission:  Thoracic spine fracture (HCC)    LOS: 7 days   Additional comments:I reviewed the patient's new clinical lab test results.   and I reviewed the patients new imaging test results.    Mechanical GLF   Acute hypercarbic respiratory failure due to aspiration - intubated 8/2 after aspiration event  C7 anterosuperior corner VB fx with involvement of  C6-C7 ventral osteophyte and widening  of the C6-C7 intervertebral disc space - NSGY c/s, Dr. Debby, continue collar Acute nondisplaced fracture through C5-C6 ventral osteophyte - NSGY c/s, Dr. Debby, continue collar T12 vertebral body fx with widening of T11-12 disc space - NSGY c/s, Dr. Debby, planning OR 8/5 after plavix  washout Occult L apical PTX, now with R effusion  L 7-9 rib fractures  Haziness in retroperitoneum around SMA - MRA somewhat motion degraded but no clear vascular injury Scalp laceration - small, local wound care Dysphagia  BPH CKD stage IIIb T2DM  Hepatic cirrhosis HTN Urinary retention  HLD Hx of CVA Gout ABL anemia    Neuro - not f/c, but sedated, does respond to pain - plan OR 8/5 with NSGY for c-spine stabilization, stable from my standpoint for OR, discussed with Dr. Debby and Dr. Gretta - change propofol  to precedex  - continue cervical collar   CV - suspected septic shock, liberalize BP goals to SBP>100 or MAP>65 - new, rate controlled AF, not a candidate for metop or amio yesterday, deferred cardioversion as etiology suspected to be septic shock - levo off this AM - hold home metoprolol  - hold home doxazosin    Pulm - intubated 8/2 after aspiration event, minimal settings currently - resp cx pending, on empiric zosyn  - nebs and chest PT   FEN/GI - TF via cortrak at 65/h (goal) - escalate bowel regimen (no BM since admission)   GU  - creatinine 2.98 from 2.56 - foley, negligible response to lasix , but UOP adequate   Heme/ID - empiric  zosyn  for aspiration PNA, day 3 - pending resp cx   Endo - 48u SSI in the last 24h, will start 25 QAM semglee  and sch 3q6h of novolog , continue SSI. Insulin  gtt started yest PM - discuss changing TF formulation with RD   PPX - PPI - LMWH 30BID   Plan - ICU, cleared for OR from my standpoint, case d/w wife, Dr. Debby, and Dr. Gretta. No current objections to proceeding. Discussed with wife yesterday plan to rescind DNR for OR.   Critical Care Total Time: 60 minutes  Dreama GEANNIE Hanger, MD Trauma & General Surgery Please use [email or web address] to contact on call provider  06/22/2024  *Care during the described time interval was provided by me. I have reviewed this patient's available data, including medical history, events of note, physical examination and test results as part of my evaluation.

## 2024-06-22 NOTE — Anesthesia Preprocedure Evaluation (Addendum)
 Anesthesia Evaluation  Patient identified by MRN, date of birth, ID band Patient awake    Reviewed: Allergy & Precautions, NPO status , Patient's Chart, lab work & pertinent test results  Airway Mallampati: Intubated  TM Distance: >3 FB Neck ROM: Limited    Dental no notable dental hx.    Pulmonary former smoker Intubated       + intubated    Cardiovascular hypertension, Normal cardiovascular exam  ECHO: 1. Left ventricular ejection fraction, by estimation, is 55 to 60%. The  left ventricle has normal function. The left ventricle has no regional  wall motion abnormalities. There is mild concentric left ventricular  hypertrophy. Left ventricular diastolic  parameters are consistent with Grade I diastolic dysfunction (impaired  relaxation).   2. Peak RV-RA gradient 16 mmHg. IVC not visualized. Right ventricular  systolic function is normal. The right ventricular size is normal.   3. Left atrial size was mildly dilated.   4. The mitral valve is normal in structure. Trivial mitral valve  regurgitation. No evidence of mitral stenosis.   5. The aortic valve is tricuspid. There is mild calcification of the  aortic valve. Aortic valve regurgitation is trivial.   6. Aortic dilatation noted. There is mild dilatation of the ascending  aorta and of the aortic root, measuring 42 mm.     Neuro/Psych  Headaches Sedated CVA  C-spine not cleared  negative psych ROS   GI/Hepatic negative GI ROS,,,(+) Cirrhosis         Endo/Other  diabetes    Renal/GU CRFRenal disease     Musculoskeletal  (+) Arthritis ,    Abdominal   Peds  Hematology  (+) Blood dyscrasia, anemia Thrombocytopenia    Anesthesia Other Findings THORACIC FRACTURE  Reproductive/Obstetrics                              Anesthesia Physical Anesthesia Plan  ASA: 4  Anesthesia Plan: General   Post-op Pain Management:    Induction:    PONV Risk Score and Plan: 2 and Ondansetron , Dexamethasone  and Treatment may vary due to age or medical condition  Airway Management Planned:   Additional Equipment:   Intra-op Plan:   Post-operative Plan: Post-operative intubation/ventilation  Informed Consent: I have reviewed the patients History and Physical, chart, labs and discussed the procedure including the risks, benefits and alternatives for the proposed anesthesia with the patient or authorized representative who has indicated his/her understanding and acceptance.   Patient has DNR.  Discussed DNR with power of attorney and Suspend DNR.   Consent reviewed with POA  Plan Discussed with: CRNA  Anesthesia Plan Comments: (8.0 oral ETT C-collar Left Burns TLC Left axillary a-line)         Anesthesia Quick Evaluation

## 2024-06-23 DIAGNOSIS — J9601 Acute respiratory failure with hypoxia: Secondary | ICD-10-CM | POA: Diagnosis not present

## 2024-06-23 DIAGNOSIS — J690 Pneumonitis due to inhalation of food and vomit: Secondary | ICD-10-CM

## 2024-06-23 DIAGNOSIS — R6521 Severe sepsis with septic shock: Secondary | ICD-10-CM | POA: Diagnosis not present

## 2024-06-23 DIAGNOSIS — A419 Sepsis, unspecified organism: Secondary | ICD-10-CM | POA: Diagnosis not present

## 2024-06-23 LAB — BPAM RBC
Blood Product Expiration Date: 202508302359
Blood Product Expiration Date: 202508272359
Blood Product Expiration Date: 202508272359
ISSUE DATE / TIME: 202508021517
Unit Type and Rh: 5100
Unit Type and Rh: 5100
Unit Type and Rh: 5100

## 2024-06-23 LAB — GLUCOSE, CAPILLARY
Glucose-Capillary: 138 mg/dL — ABNORMAL HIGH (ref 70–99)
Glucose-Capillary: 144 mg/dL — ABNORMAL HIGH (ref 70–99)
Glucose-Capillary: 152 mg/dL — ABNORMAL HIGH (ref 70–99)
Glucose-Capillary: 156 mg/dL — ABNORMAL HIGH (ref 70–99)
Glucose-Capillary: 158 mg/dL — ABNORMAL HIGH (ref 70–99)
Glucose-Capillary: 168 mg/dL — ABNORMAL HIGH (ref 70–99)
Glucose-Capillary: 168 mg/dL — ABNORMAL HIGH (ref 70–99)
Glucose-Capillary: 174 mg/dL — ABNORMAL HIGH (ref 70–99)
Glucose-Capillary: 174 mg/dL — ABNORMAL HIGH (ref 70–99)
Glucose-Capillary: 176 mg/dL — ABNORMAL HIGH (ref 70–99)
Glucose-Capillary: 187 mg/dL — ABNORMAL HIGH (ref 70–99)
Glucose-Capillary: 188 mg/dL — ABNORMAL HIGH (ref 70–99)
Glucose-Capillary: 194 mg/dL — ABNORMAL HIGH (ref 70–99)
Glucose-Capillary: 207 mg/dL — ABNORMAL HIGH (ref 70–99)
Glucose-Capillary: 208 mg/dL — ABNORMAL HIGH (ref 70–99)
Glucose-Capillary: 216 mg/dL — ABNORMAL HIGH (ref 70–99)

## 2024-06-23 LAB — TYPE AND SCREEN
ABO/RH(D): O POS
Antibody Screen: NEGATIVE
Unit division: 0
Unit division: 0
Unit division: 0

## 2024-06-23 LAB — BASIC METABOLIC PANEL WITH GFR
Anion gap: 8 (ref 5–15)
BUN: 111 mg/dL — ABNORMAL HIGH (ref 8–23)
CO2: 22 mmol/L (ref 22–32)
Calcium: 8.7 mg/dL — ABNORMAL LOW (ref 8.9–10.3)
Chloride: 110 mmol/L (ref 98–111)
Creatinine, Ser: 3.31 mg/dL — ABNORMAL HIGH (ref 0.61–1.24)
GFR, Estimated: 18 mL/min — ABNORMAL LOW (ref >60)
Glucose, Bld: 226 mg/dL — ABNORMAL HIGH (ref 70–99)
Potassium: 5.9 mmol/L — ABNORMAL HIGH (ref 3.5–5.1)
Sodium: 140 mmol/L (ref 135–145)

## 2024-06-23 LAB — CBC
HCT: 24.1 % — ABNORMAL LOW (ref 39.0–52.0)
Hemoglobin: 7.6 g/dL — ABNORMAL LOW (ref 13.0–17.0)
MCH: 32.8 pg (ref 26.0–34.0)
MCHC: 31.5 g/dL (ref 30.0–36.0)
MCV: 103.9 fL — ABNORMAL HIGH (ref 80.0–100.0)
Platelets: 141 K/uL — ABNORMAL LOW (ref 150–400)
RBC: 2.32 MIL/uL — ABNORMAL LOW (ref 4.22–5.81)
RDW: 16.4 % — ABNORMAL HIGH (ref 11.5–15.5)
WBC: 13.3 K/uL — ABNORMAL HIGH (ref 4.0–10.5)
nRBC: 0 % (ref 0.0–0.2)

## 2024-06-23 MED ORDER — INSULIN ASPART 100 UNIT/ML IJ SOLN
0.0000 [IU] | INTRAMUSCULAR | Status: DC
  Administered 2024-06-23: 2 [IU] via SUBCUTANEOUS
  Administered 2024-06-23: 1 [IU] via SUBCUTANEOUS
  Administered 2024-06-24 (×6): 3 [IU] via SUBCUTANEOUS
  Administered 2024-06-25 (×2): 2 [IU] via SUBCUTANEOUS
  Administered 2024-06-25: 1 [IU] via SUBCUTANEOUS
  Administered 2024-06-25: 2 [IU] via SUBCUTANEOUS

## 2024-06-23 MED ORDER — PIPERACILLIN-TAZOBACTAM IN DEX 2-0.25 GM/50ML IV SOLN
2.2500 g | Freq: Three times a day (TID) | INTRAVENOUS | Status: DC
  Administered 2024-06-23 – 2024-06-25 (×6): 2.25 g via INTRAVENOUS
  Filled 2024-06-23 (×7): qty 50

## 2024-06-23 MED ORDER — SODIUM ZIRCONIUM CYCLOSILICATE 5 G PO PACK
5.0000 g | PACK | Freq: Once | ORAL | Status: AC
  Administered 2024-06-23: 5 g
  Filled 2024-06-23: qty 1

## 2024-06-23 MED ORDER — DOCUSATE SODIUM 50 MG/5ML PO LIQD: 100.0000 mg | Freq: Two times a day (BID) | ORAL | Status: DC

## 2024-06-23 MED ORDER — MIDAZOLAM HCL 2 MG/2ML IJ SOLN: 1.0000 mg | INTRAMUSCULAR | Status: DC | PRN

## 2024-06-23 MED ORDER — FUROSEMIDE 10 MG/ML IJ SOLN
100.0000 mg | Freq: Once | INTRAVENOUS | Status: AC
  Administered 2024-06-23: 100 mg via INTRAVENOUS
  Filled 2024-06-23: qty 10

## 2024-06-23 MED ORDER — INSULIN ASPART 100 UNIT/ML IJ SOLN
7.0000 [IU] | INTRAMUSCULAR | Status: DC
  Administered 2024-06-23 – 2024-06-24 (×4): 7 [IU] via SUBCUTANEOUS

## 2024-06-23 MED ORDER — FENTANYL CITRATE PF 50 MCG/ML IJ SOSY: 25.0000 ug | PREFILLED_SYRINGE | INTRAMUSCULAR | Status: DC | PRN

## 2024-06-23 MED ORDER — DEXTROSE 10 % IV SOLN: INTRAVENOUS | Status: DC

## 2024-06-23 MED ORDER — FENTANYL CITRATE PF 50 MCG/ML IJ SOSY
25.0000 ug | PREFILLED_SYRINGE | INTRAMUSCULAR | Status: DC | PRN
  Administered 2024-06-24: 50 ug via INTRAVENOUS
  Filled 2024-06-23: qty 1

## 2024-06-23 MED ORDER — INSULIN GLARGINE-YFGN 100 UNIT/ML ~~LOC~~ SOLN
20.0000 [IU] | Freq: Two times a day (BID) | SUBCUTANEOUS | Status: DC
  Administered 2024-06-23 – 2024-06-24 (×3): 20 [IU] via SUBCUTANEOUS
  Filled 2024-06-23 (×4): qty 0.2

## 2024-06-23 MED ORDER — POLYETHYLENE GLYCOL 3350 17 G PO PACK: 17.0000 g | PACK | Freq: Every day | ORAL | Status: DC

## 2024-06-23 MED ORDER — METOPROLOL TARTRATE 5 MG/5ML IV SOLN
5.0000 mg | INTRAVENOUS | Status: DC | PRN
  Filled 2024-06-23: qty 5

## 2024-06-23 NOTE — Progress Notes (Signed)
 OT Cancellation Note  Patient Details Name: NEYMAR DOWE MRN: [id number] DOB: 04/09/1941   Cancelled Treatment:    Reason Eval/Treat Not Completed: Medical issues which prohibited therapy (Pt intubated with decreased responsiveness.)  Kennth Mliss Helling 06/23/2024, 12:41 PM Mliss HERO, OTR/L Acute Rehabilitation Services Office: [phone]

## 2024-06-23 NOTE — Progress Notes (Signed)
    Providing Compassionate, Quality Care - Together   NEUROSURGERY PROGRESS NOTE     S: No issues overnight.    O: EXAM:  BP (!) 99/35   Pulse 74   Temp 98.1 F (36.7 C)   Resp (!) 8   Ht 6' (1.829 m)   Wt 94.7 kg   SpO2 98%   BMI 28.32 kg/m     Intubated Eyes open spontaneously Collar in place    ASSESSMENT:  83 y.o. with   -C5-6, 6-7 anterior osteophyte fracture  -T10-T11 fx/disc injury s/p percutaneous instrumentation/fusion on 8/5    PLAN: -Continue collar -Continue TLSO when upright as he is able -Ok to resume Plavix  8/8 from postop standpoint, will defer to primary teams.  -Call w/ questions/concerns.   Camie Pickle, [facility]
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
## 2024-06-23 NOTE — Progress Notes (Signed)
 PT Cancellation Note  Patient Details Name: Dave Wright MRN: [id number] DOB: 10/17/1941   Cancelled Treatment:    Reason Eval/Treat Not Completed: Medical issues which prohibited therapy (Pt is still intubated. Limited response to commands. Will continue to follow up as able and appropriate.)  Dorothyann Maier, DPT, CLT  Acute Rehabilitation Services Office: [phone] (Secure chat preferred)   Dorothyann VEAR Maier 06/23/2024, 12:58 PM

## 2024-06-23 NOTE — Consult Note (Signed)
 WOC Nurse Consult Note:  WOC consult performed remotely utilizing imaging and chart review.  Reason for Consult:sactal/buttock DTI  Wound type: deep tissue pressure injury to bilateral buttocks  (initial WOC consult performed 7/31) Pressure Injury POA: Yes Measurement: see nursing flow sheets Wound azi:izze purple maroon discoloration to bilateral buttocks  Drainage (amount, consistency, odor) none Periwound: intact Dressing procedure/placement/frequency: Will keep current wound care orders:   Cleanse buttocks, apply Xeroform and cover with silicone foam dressing.  Change daily   WOC team will not follow at this time, please re-consult if new needs arise.  Thank you,  Doyal Polite, RN, MSN, [facility] WOC Team

## 2024-06-23 NOTE — Progress Notes (Signed)
 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  Wide pulse pressures.  Seems to have started after chest physiotherapy, improved aeration without much rhonchi at this point  eICU Interventions  Discontinue chest physiotherapy  Add metoprolol  as needed for sustained heart rate greater than 140     Intervention Category Intermediate Interventions: Arrhythmia - evaluation and management  Francile Woolford 06/23/2024, 12:05 AM

## 2024-06-23 NOTE — Progress Notes (Signed)
 Trauma/Critical Care Follow Up Note  Subjective:    Overnight Issues: Underwent spinal fixation yesterday. HR 80s this morning from 120-130s overnight. Cr up to 3.3, K 6. Off sedation and opening eyes to command this morning. Vent settings minimal   Objective:  Vital signs for last 24 hours: Temp:  [96.4 F (35.8 C)-99.9 F (37.7 C)] 98.4 F (36.9 C) (08/06 0715) Pulse Rate:  [75-139] 81 (08/06 0715) Resp:  [0-32] 18 (08/06 0715) BP: (78-119)/(33-36) 99/35 (08/06 0200) SpO2:  [96 %-100 %] 100 % (08/06 0715) Arterial Line BP: (80-169)/(26-58) 127/32 (08/06 0715) FiO2 (%):  [40 %] 40 % (08/06 0321) Weight:  [94.7 kg] 94.7 kg (08/06 0346)  Hemodynamic parameters for last 24 hours:    Intake/Output from previous day: 08/05 0701 - 08/06 0700 In: 2607.3 [I.V.:1710.8; NG/GT:746.8; IV Piggyback:149.7] Out: 1715 [Urine:1690; Blood:25]  Intake/Output this shift: No intake/output data recorded.  Vent settings for last 24 hours: Vent Mode: PRVC FiO2 (%):  [40 %] 40 % Set Rate:  [18 bmp] 18 bmp Vt Set:  [540 mL] 540 mL PEEP:  [5 cmH20] 5 cmH20 Plateau Pressure:  [18 cmH20-19 cmH20] 18 cmH20  Physical Exam:  Gen: comfortable, no distress Neuro: sedated on exam HEENT: PERRL Neck: c-collar in place CV: RRR Pulm: unlabored breathing on mechanical ventilation-full support Abd: soft, NT  , +BM GU: urine clear and yellow, +Foley Extr: wwp, no edema  Results for orders placed or performed during the hospital encounter of 06/15/24 (from the past 24 hours)  Prepare RBC (crossmatch)     Status: None   Collection Time: 06/22/24  7:35 AM  Result Value Ref Range   Order Confirmation      ORDER PROCESSED BY BLOOD BANK Performed at [facility], [address], [place], [place] [address]   Glucose, capillary     Status: Abnormal   Collection Time: 06/22/24  8:01 AM  Result Value Ref Range   Glucose-Capillary 112 (H) 70 - 99 mg/dL  Glucose, capillary     Status: Abnormal    Collection Time: 06/22/24  9:10 AM  Result Value Ref Range   Glucose-Capillary 146 (H) 70 - 99 mg/dL  Glucose, capillary     Status: Abnormal   Collection Time: 06/22/24 10:09 AM  Result Value Ref Range   Glucose-Capillary 138 (H) 70 - 99 mg/dL  Glucose, capillary     Status: Abnormal   Collection Time: 06/22/24 11:03 AM  Result Value Ref Range   Glucose-Capillary 133 (H) 70 - 99 mg/dL  Glucose, capillary     Status: Abnormal   Collection Time: 06/22/24 12:56 PM  Result Value Ref Range   Glucose-Capillary 134 (H) 70 - 99 mg/dL  Glucose, capillary     Status: Abnormal   Collection Time: 06/22/24  3:04 PM  Result Value Ref Range   Glucose-Capillary 136 (H) 70 - 99 mg/dL  Glucose, capillary     Status: Abnormal   Collection Time: 06/22/24  5:11 PM  Result Value Ref Range   Glucose-Capillary 140 (H) 70 - 99 mg/dL  Glucose, capillary     Status: Abnormal   Collection Time: 06/22/24  6:57 PM  Result Value Ref Range   Glucose-Capillary 193 (H) 70 - 99 mg/dL  Type and screen     Status: None   Collection Time: 06/22/24  7:00 PM  Result Value Ref Range   ABO/RH(D) O POS    Antibody Screen NEG    Sample Expiration  06/25/2024,2359 Performed at [facility], [address] [place], [place] [address]   Glucose, capillary     Status: Abnormal   Collection Time: 06/22/24  8:02 PM  Result Value Ref Range   Glucose-Capillary 185 (H) 70 - 99 mg/dL  Glucose, capillary     Status: Abnormal   Collection Time: 06/22/24  9:15 PM  Result Value Ref Range   Glucose-Capillary 193 (H) 70 - 99 mg/dL  Glucose, capillary     Status: Abnormal   Collection Time: 06/22/24 10:11 PM  Result Value Ref Range   Glucose-Capillary 189 (H) 70 - 99 mg/dL  CBC     Status: Abnormal   Collection Time: 06/22/24 11:00 PM  Result Value Ref Range   WBC 7.9 4.0 - 10.5 K/uL   RBC 2.29 (L) 4.22 - 5.81 MIL/uL   Hemoglobin 7.3 (L) 13.0 - 17.0 g/dL   HCT 76.0 (L) 60.9 - 47.9 %   MCV 104.4 (H) 80.0 - 100.0  fL   MCH 31.9 26.0 - 34.0 pg   MCHC 30.5 30.0 - 36.0 g/dL   RDW 83.5 (H) 88.4 - 84.4 %   Platelets 95 (L) 150 - 400 K/uL   nRBC 0.3 (H) 0.0 - 0.2 %  Basic metabolic panel     Status: Abnormal   Collection Time: 06/22/24 11:00 PM  Result Value Ref Range   Sodium 139 135 - 145 mmol/L   Potassium 5.8 (H) 3.5 - 5.1 mmol/L   Chloride 108 98 - 111 mmol/L   CO2 23 22 - 32 mmol/L   Glucose, Bld 171 (H) 70 - 99 mg/dL   BUN 892 (H) 8 - 23 mg/dL   Creatinine, Ser 6.90 (H) 0.61 - 1.24 mg/dL   Calcium  8.6 (L) 8.9 - 10.3 mg/dL   GFR, Estimated 19 (L) >60 mL/min   Anion gap 8 5 - 15  Glucose, capillary     Status: Abnormal   Collection Time: 06/22/24 11:16 PM  Result Value Ref Range   Glucose-Capillary 152 (H) 70 - 99 mg/dL  Glucose, capillary     Status: Abnormal   Collection Time: 06/23/24 12:25 AM  Result Value Ref Range   Glucose-Capillary 152 (H) 70 - 99 mg/dL  Glucose, capillary     Status: Abnormal   Collection Time: 06/23/24  1:44 AM  Result Value Ref Range   Glucose-Capillary 176 (H) 70 - 99 mg/dL  Glucose, capillary     Status: Abnormal   Collection Time: 06/23/24  2:54 AM  Result Value Ref Range   Glucose-Capillary 216 (H) 70 - 99 mg/dL  Glucose, capillary     Status: Abnormal   Collection Time: 06/23/24  4:12 AM  Result Value Ref Range   Glucose-Capillary 207 (H) 70 - 99 mg/dL  Basic metabolic panel     Status: Abnormal   Collection Time: 06/23/24  4:38 AM  Result Value Ref Range   Sodium 140 135 - 145 mmol/L   Potassium 5.9 (H) 3.5 - 5.1 mmol/L   Chloride 110 98 - 111 mmol/L   CO2 22 22 - 32 mmol/L   Glucose, Bld 226 (H) 70 - 99 mg/dL   BUN 888 (H) 8 - 23 mg/dL   Creatinine, Ser 6.68 (H) 0.61 - 1.24 mg/dL   Calcium  8.7 (L) 8.9 - 10.3 mg/dL   GFR, Estimated 18 (L) >60 mL/min   Anion gap 8 5 - 15  Glucose, capillary     Status: Abnormal   Collection Time:  06/23/24  5:36 AM  Result Value Ref Range   Glucose-Capillary 194 (H) 70 - 99 mg/dL  Glucose, capillary      Status: Abnormal   Collection Time: 06/23/24  7:18 AM  Result Value Ref Range   Glucose-Capillary 174 (H) 70 - 99 mg/dL    Assessment & Plan: The plan of care was discussed with the bedside nurse for the day, who is in agreement with this plan and no additional concerns were raised.   Present on Admission:  Thoracic spine fracture (HCC)    LOS: 8 days   Additional comments:I reviewed the patient's new clinical lab test results.   and I reviewed the patients new imaging test results.    Mechanical GLF   Acute hypercarbic respiratory failure due to aspiration - intubated 8/2 after aspiration event. Vent settings minimal this AM. Wean to extubate per CCM C7 anterosuperior corner VB fx with involvement of  C6-C7 ventral osteophyte and widening of the C6-C7 intervertebral disc space - NSGY c/s, Dr. Debby, continue collar Acute nondisplaced fracture through C5-C6 ventral osteophyte - NSGY c/s, Dr. Debby, continue collar T12 vertebral body fx with widening of T11-12 disc space - s/p fixation 8/5 w/ Dr. Debby Occult L apical PTX, now with R effusion  L 7-9 rib fractures  Haziness in retroperitoneum around SMA - MRA somewhat motion degraded but no clear vascular injury Scalp laceration - small, local wound care Dysphagia  BPH CKD stage IIIb T2DM  Hepatic cirrhosis HTN Urinary retention  HLD Hx of CVA Gout ABL anemia    Neuro - OR 8/5 with NSGY for thoracic fixation - continue cervical collar - opening eyes to command this morning - would favor PRNs over continuous infusions for agitation     CV - new Afib this admission. HR 80s this morning - PRN metoprolol  added 8/5 - low dose levo on this morning, wean as able for MAP goal >65 OR SBP > 100 - hold home metoprolol  - hold home doxazosin    Pulm - intubated 8/2 after aspiration event, minimal settings currently - resp cx no growth 3/3, on empiric zosyn  - nebs and chest PT   FEN/GI - TF held post-procedure. Okay to  resume at goal this morning (65/hr) - multiple BM/diarrhea this morning. Hold bowel regimen.    GU  - creatinine 3.3 this morning, K 6 - foley, negligible response to lasix , but UOP adequate - will defer management to CCM   Heme/ID - empiric zosyn  for aspiration PNA, day 3 - resp cx NG 3/3   Endo - continue SSI, insulin  gtt  PPX - PPI - LMWH 30BID   Plan - ICU, CCM following   Critical Care Total Time: 30 minutes  Cordella Idler, MD Trauma & General Surgery Please use [email or web address] to contact on call provider  06/23/2024  *Care during the described time interval was provided by me. I have reviewed this patient's available data, including medical history, events of note, physical examination and test results as part of my evaluation.

## 2024-06-23 NOTE — Inpatient Diabetes Management (Signed)
 Inpatient Diabetes Program Recommendations  AACE/ADA: New Consensus Statement on Inpatient Glycemic Control (2025)  Target Ranges:  Prepandial:   less than 140 mg/dL      Peak postprandial:   less than 180 mg/dL (1-2 hours)      Critically ill patients:  140 - 180 mg/dL     Review of Glycemic Control  Latest Reference Range & Units 06/23/24 07:42 06/23/24 08:55 06/23/24 09:58 06/23/24 12:03  Glucose-Capillary 70 - 99 mg/dL 843 (H) 831 (H) 831 (H) 144 (H)   Diabetes history: None Current orders for Inpatient glycemic control:  IV insulin  Inpatient Diabetes Program Recommendations:    Note that patient continues on IV insulin .  Recommend use of phase 3 of ICU orders for transition off insulin  drip.   Current insulin  drip rate is 4 units/hr. For transition consider:  Semglee  20 units bid, Novolog  7 units q 4 hours, and Novolog  sensitive correction.     Thanks,  Randall Bullocks, RN, BC-ADM Inpatient Diabetes Coordinator Pager [phone]  (8a-5p)

## 2024-06-24 DIAGNOSIS — I4891 Unspecified atrial fibrillation: Secondary | ICD-10-CM

## 2024-06-24 DIAGNOSIS — J9601 Acute respiratory failure with hypoxia: Secondary | ICD-10-CM | POA: Diagnosis not present

## 2024-06-24 DIAGNOSIS — J690 Pneumonitis due to inhalation of food and vomit: Secondary | ICD-10-CM | POA: Diagnosis not present

## 2024-06-24 DIAGNOSIS — A419 Sepsis, unspecified organism: Secondary | ICD-10-CM | POA: Diagnosis not present

## 2024-06-24 LAB — CULTURE, BLOOD (ROUTINE X 2)
Culture: NO GROWTH
Culture: NO GROWTH
Special Requests: ADEQUATE
Special Requests: ADEQUATE

## 2024-06-24 LAB — BASIC METABOLIC PANEL WITH GFR
Anion gap: 9 (ref 5–15)
BUN: 127 mg/dL — ABNORMAL HIGH (ref 8–23)
CO2: 24 mmol/L (ref 22–32)
Calcium: 8.3 mg/dL — ABNORMAL LOW (ref 8.9–10.3)
Chloride: 105 mmol/L (ref 98–111)
Creatinine, Ser: 3.21 mg/dL — ABNORMAL HIGH (ref 0.61–1.24)
GFR, Estimated: 18 mL/min — ABNORMAL LOW (ref >60)
Glucose, Bld: 278 mg/dL — ABNORMAL HIGH (ref 70–99)
Potassium: 4.6 mmol/L (ref 3.5–5.1)
Sodium: 138 mmol/L (ref 135–145)

## 2024-06-24 LAB — GLUCOSE, CAPILLARY
Glucose-Capillary: 230 mg/dL — ABNORMAL HIGH (ref 70–99)
Glucose-Capillary: 237 mg/dL — ABNORMAL HIGH (ref 70–99)
Glucose-Capillary: 251 mg/dL — ABNORMAL HIGH (ref 70–99)
Glucose-Capillary: 229 mg/dL — ABNORMAL HIGH (ref 70–99)
Glucose-Capillary: 201 mg/dL — ABNORMAL HIGH (ref 70–99)
Glucose-Capillary: 215 mg/dL — ABNORMAL HIGH (ref 70–99)
Glucose-Capillary: 213 mg/dL — ABNORMAL HIGH (ref 70–99)
Glucose-Capillary: 182 mg/dL — ABNORMAL HIGH (ref 70–99)

## 2024-06-24 MED ORDER — AMIODARONE LOAD VIA INFUSION
150.0000 mg | Freq: Once | INTRAVENOUS | Status: AC
  Administered 2024-06-24: 150 mg via INTRAVENOUS
  Filled 2024-06-24: qty 83.34

## 2024-06-24 MED ORDER — SODIUM CHLORIDE 3 % IN NEBU
4.0000 mL | INHALATION_SOLUTION | RESPIRATORY_TRACT | Status: DC
  Filled 2024-06-24: qty 4

## 2024-06-24 MED ORDER — ORAL CARE MOUTH RINSE
15.0000 mL | OROMUCOSAL | Status: DC
  Administered 2024-06-24 – 2024-06-25 (×3): 15 mL via OROMUCOSAL

## 2024-06-24 MED ORDER — FUROSEMIDE 10 MG/ML IJ SOLN
100.0000 mg | Freq: Three times a day (TID) | INTRAVENOUS | Status: AC
  Administered 2024-06-24 (×2): 100 mg via INTRAVENOUS
  Filled 2024-06-24 (×2): qty 10

## 2024-06-24 MED ORDER — INSULIN GLARGINE-YFGN 100 UNIT/ML ~~LOC~~ SOLN
36.0000 [IU] | Freq: Two times a day (BID) | SUBCUTANEOUS | Status: DC
  Administered 2024-06-24 – 2024-06-25 (×2): 36 [IU] via SUBCUTANEOUS
  Filled 2024-06-24 (×3): qty 0.36

## 2024-06-24 MED ORDER — AMIODARONE HCL IN DEXTROSE 360-4.14 MG/200ML-% IV SOLN
60.0000 mg/h | INTRAVENOUS | Status: AC
  Administered 2024-06-24 (×2): 60 mg/h via INTRAVENOUS
  Filled 2024-06-24: qty 400

## 2024-06-24 MED ORDER — SODIUM ZIRCONIUM CYCLOSILICATE 10 G PO PACK
10.0000 g | PACK | Freq: Once | ORAL | Status: AC
  Administered 2024-06-24: 10 g
  Filled 2024-06-24: qty 1

## 2024-06-24 MED ORDER — INSULIN ASPART 100 UNIT/ML IJ SOLN
10.0000 [IU] | INTRAMUSCULAR | Status: DC
  Administered 2024-06-24 – 2024-06-25 (×5): 10 [IU] via SUBCUTANEOUS

## 2024-06-24 MED ORDER — ORAL CARE MOUTH RINSE
15.0000 mL | OROMUCOSAL | Status: DC | PRN
Start: 2024-06-24 — End: 2024-06-26

## 2024-06-24 MED ORDER — SODIUM CHLORIDE 3 % IN NEBU
4.0000 mL | INHALATION_SOLUTION | RESPIRATORY_TRACT | Status: DC
  Administered 2024-06-24 – 2024-06-25 (×6): 4 mL via RESPIRATORY_TRACT
  Filled 2024-06-24 (×6): qty 4

## 2024-06-24 MED ORDER — MAGNESIUM SULFATE 2 GM/50ML IV SOLN
2.0000 g | Freq: Once | INTRAVENOUS | Status: AC
  Administered 2024-06-24: 2 g via INTRAVENOUS
  Filled 2024-06-24: qty 50

## 2024-06-24 MED ORDER — AMIODARONE HCL IN DEXTROSE 360-4.14 MG/200ML-% IV SOLN
30.0000 mg/h | INTRAVENOUS | Status: DC
  Administered 2024-06-24 – 2024-06-25 (×2): 30 mg/h via INTRAVENOUS
  Filled 2024-06-24 (×2): qty 200

## 2024-06-24 NOTE — Clinical Note (Incomplete)
 Pt noted to be desaturating. Upon assessment audible gurgle noted. O2 increased and RT called for NT suction. After NT suctioning with little improvement CCM called per RT

## 2024-06-24 NOTE — Progress Notes (Signed)
   06/24/24 0724  Adult Ventilator Settings  Vent Mode PSV;CPAP  FiO2 (%) 40 %  Pressure Support 8 cmH20  PEEP 5 cmH20   Pt was placed on PS/CPAP mode and is tolerating well at this time.

## 2024-06-24 NOTE — Progress Notes (Signed)
 Pt noted to be desaturating. Upon assessment audible gurgle noted. O2 increased and RT called for airway suction. After NT suctioning with little improvement CCM notified per RT

## 2024-06-24 NOTE — Progress Notes (Signed)
   Inpatient Rehab Admissions Coordinator :  Per therapy recommendations patient was screened for CIR candidacy by Heron Leavell RN MSN. Extubated this am. Patient is not yet at a level to tolerate the intensity required to pursue a CIR admit. The CIR admissions team will follow and monitor for progress and place a Rehab Consult order if felt to be appropriate. Please contact me with any questions.  Heron Leavell RN MSN Admissions Coordinator [phone]

## 2024-06-24 NOTE — Procedures (Signed)
 Extubation Procedure Note  Patient Details:   Name: Dave Wright DOB: 1941/03/29 MRN: [id number]   Airway Documentation:    Vent end date: 06/24/24 Vent end time: 1026   Evaluation  O2 sats: stable throughout Complications: No apparent complications Patient did tolerate procedure well. Bilateral Breath Sounds: Diminished, Rhonchi   No Pt extubated to 10L [place]. Positive cuff leak noted, no stridor noted.  Dave Wright 06/24/2024, 10:36 AM

## 2024-06-24 NOTE — Progress Notes (Signed)
 Pt respiratory culture aspirated and sent to lab at this time

## 2024-06-24 NOTE — Progress Notes (Signed)
 RT was called due to sat's of 85. Pt has NTS order as needed. PT is also high risk for re-intubation. RT and RN attempted to NTS, but did not get many secretions. Pt has lots of secretions and no capable of coughing them up. Sat's dropped to 80. Non-rebreather placed, sats now at 97. EMA was called. Waiting for pt to be assessed at this time.

## 2024-06-24 NOTE — Progress Notes (Signed)
 Physical Therapy Treatment Patient Details Name: Dave Wright MRN: [id number] DOB: 18-Feb-1941 Today's Date: 06/24/2024   History of Present Illness Pt is a 83 yo male s/p fall. CT(+) acute displaced C7 fx, non displaced C5-6, T10-11 vertical cleft fx with disc space involvement, Small L HPTX and L 7-9 rib fxs. Chest CT showed enlarging R effusion and ABG showed hypercarbia requiring intubation 8/2. s/p T10-T11 percutaneous instrumentation/fusion on 8/5, extubated 8/7. PMH arthritis, chroinc daily HA, chronic leukopenia,CVA, DM2, gout, hepatic cirrhosis, HTN, HLD, OA,squamous cell carcinoma, Back surg, R total shoulder sx.    PT Comments  Re-evaluated post op and post extubation. Requires total assist +2 for bed mobility and partial squat with attempt to stand. VSS throughout on 10L HFNC. Responding inconsistently to some simple motor commands. Speaking/answering questions minimally. Sat EOB for a while working on seated balance (leaning Rt and posteriorly.) Cervical collar on at all times, TLSO donned/doffed in supine. Worked on log roll techniques. Patient will continue to benefit from skilled physical therapy services to further improve independence with functional mobility.     If plan is discharge home, recommend the following: Assist for transportation;Help with stairs or ramp for entrance;Two people to help with walking and/or transfers;Two people to help with bathing/dressing/bathroom;Assistance with cooking/housework;Assistance with feeding;Direct supervision/assist for medications management;Direct supervision/assist for financial management;Supervision due to cognitive status   Can travel by Doctor, hospital (measurements PT);Wheelchair cushion (measurements PT);BSC/3in1;Hospital bed (Pending progress)    Recommendations for Other Services Rehab consult     Precautions / Restrictions Precautions Precautions: Back;Cervical Precaution  Booklet Issued: No Recall of Precautions/Restrictions: Impaired Precaution/Restrictions Comments: cortrak Required Braces or Orthoses: Spinal Brace;Cervical Brace Cervical Brace: Hard collar;At all times Spinal Brace: Thoracolumbosacral orthotic;Applied in supine position Restrictions Weight Bearing Restrictions Per Provider Order: No Other Position/Activity Restrictions: TLSO brace when HOB > 45* or when OOB     Mobility  Bed Mobility Overal bed mobility: Needs Assistance Bed Mobility: Rolling, Sidelying to Sit, Sit to Sidelying Rolling: Total assist, +2 for physical assistance, +2 for safety/equipment Sidelying to sit: Total assist (x3)     Sit to sidelying: Total assist (x3) General bed mobility comments: Requires assist to bend knee to assist with rolling, assist to guide UE to R side with overall Total A x 3 to needed to sit EOB, return to side and roll both directions to don/doff TLSO brace. Cues throughout, pt grimacing with movement. Hard collar in place, pillow supporting head, spine kept in neutral with movements.    Transfers Overall transfer level: Needs assistance Equipment used: 2 person hand held assist Transfers: Sit to/from Stand Sit to Stand: +2 physical assistance, +2 safety/equipment, Total assist           General transfer comment: partial stand with Total A x 2 while RN assisting in changing sheets. Difficult to assess if pt engaging LEs meaningfully.    Ambulation/Gait                   Stairs             Wheelchair Mobility     Tilt Bed    Modified Rankin (Stroke Patients Only)       Balance Overall balance assessment: Needs assistance Sitting-balance support: Feet supported, Bilateral upper extremity supported Sitting balance-Leahy Scale: Poor Sitting balance - Comments: brief CGA for 10 seconds once initially EOB progressing to Max A at times with R lateral lean and  posterior lean Postural control: Right lateral lean,  Posterior lean Standing balance support: Bilateral upper extremity supported, During functional activity Standing balance-Leahy Scale: Zero Standing balance comment: total +2                            Communication Communication Communication: Impaired Factors Affecting Communication: Hearing impaired;Reduced clarity of speech  Cognition Arousal: Lethargic Behavior During Therapy: Flat affect   PT - Cognitive impairments: Difficult to assess Difficult to assess due to: Level of arousal                     PT - Cognition Comments: Delayed responses, inconsistent with following simple commands Following commands: Impaired Following commands impaired: Follows one step commands inconsistently, Follows one step commands with increased time    Cueing Cueing Techniques: Verbal cues, Tactile cues  Exercises Low Level/ICU Exercises Ankle Circles/Pumps: AAROM, Both, 10 reps, Supine Heel Slides: AAROM, PROM, Both, 5 reps, Supine Other Exercises Other Exercises: Passive hip IR/ER in supine x5 ea Other Exercises: Seated balance, lateral weight shift to left elbow and back to midline. Min assist    General Comments General comments (skin integrity, edema, etc.): Family at bedside. Nursing present for duration of session. VSS on 10L HFNC      Pertinent Vitals/Pain Pain Assessment Pain Assessment: CPOT Facial Expression: Tense Body Movements: Absence of movements Muscle Tension: Tense, rigid Compliance with ventilator (intubated pts.): N/A Vocalization (extubated pts.): Sighing, moaning CPOT Total: 3 Pain Location: Unable to state but nods yes when asked if back/neck hurt. Grimacing with movements. Pain Descriptors / Indicators: Grimacing Pain Intervention(s): Limited activity within patient's tolerance, Monitored during session, Premedicated before session, Repositioned, Patient requesting pain meds-RN notified, RN gave pain meds during session    Home Living  Family/patient expects to be discharged to:: Private residence Living Arrangements: Spouse/significant other Available Help at Discharge: Family;Available 24 hours/day Type of Home: House Home Access: Ramped entrance       Home Layout: One level Home Equipment: Grab bars - tub/shower;Rollator (4 wheels);Cane - single point;Lift chair      Prior Function            PT Goals (current goals can now be found in the care plan section) Acute Rehab PT Goals Patient Stated Goal: return home PT Goal Formulation: With patient/family Time For Goal Achievement: 07/08/24 Potential to Achieve Goals: Good    Frequency    Min 3X/week      PT Plan      Co-evaluation PT/OT/SLP Co-Evaluation/Treatment: Yes Reason for Co-Treatment: For patient/therapist safety;To address functional/ADL transfers;Complexity of the patient's impairments (multi-system involvement);Necessary to address cognition/behavior during functional activity PT goals addressed during session: Mobility/safety with mobility;Balance;Strengthening/ROM OT goals addressed during session: Strengthening/ROM      AM-PAC PT 6 Clicks Mobility   Outcome Measure  Help needed turning from your back to your side while in a flat bed without using bedrails?: Total Help needed moving from lying on your back to sitting on the side of a flat bed without using bedrails?: Total Help needed moving to and from a bed to a chair (including a wheelchair)?: Total Help needed standing up from a chair using your arms (e.g., wheelchair or bedside chair)?: Total Help needed to walk in hospital room?: Total Help needed climbing 3-5 steps with a railing? : Total 6 Click Score: 6    End of Session Equipment Utilized During Treatment: Gait belt;Back brace;Cervical collar;Oxygen Activity  Tolerance: Patient limited by lethargy Patient left: with call bell/phone within reach;with family/visitor present;in bed;with bed alarm set;with nursing/sitter  in room (HOB to 30 deg) Nurse Communication: Mobility status;Precautions PT Visit Diagnosis: Unsteadiness on feet (R26.81);Other abnormalities of gait and mobility (R26.89);Muscle weakness (generalized) (M62.81);History of falling (Z91.81);Difficulty in walking, not elsewhere classified (R26.2);Pain;Other symptoms and signs involving the nervous system (R29.898) Pain - part of body:  (back and neck)     Time: 1340-1420 PT Time Calculation (min) (ACUTE ONLY): 40 min  Charges:      PT General Charges $$ ACUTE PT VISIT: 1 Visit                     Leontine Roads, PT, DPT [facility]  Rehabilitation Services Physical Therapist Office: [phone] Website: [email or web address]    Leontine GORMAN Roads 06/24/2024, 3:21 PM

## 2024-06-24 NOTE — Progress Notes (Signed)
 Occupational Therapy Treatment/Re-Evaluation Patient Details Name: Dave Wright MRN: [id number] DOB: 05-10-1941 Today's Date: 06/24/2024   History of present illness Pt is a 83 yo male s/p fall. CT(+) acute displaced C7 fx, non displaced C5-6, T10-11 vertical cleft fx with disc space involvement, Small L HPTX and L 7-9 rib fxs. Chest CT showed enlarging R effusion and ABG showed hypercarbia requiring intubation 8/2. s/p percutaneous instrumentation/fusion on 8/5, extubated 8/7. PMH arthritis, chroinc daily HA, chronic leukopenia,CVA, DM2, gout, hepatic cirrhosis, HTN, HLD, OA,squamous cell carcinoma, Back surg, R total shoulder sx.   OT comments  Pt seen for first OT session s/p thoracic fusion and extubation this AM; goals adjusted accordingly. Pt lethargic, briefly opening eyes and nodding head yes to various pain locations and inconsistently following commands. TLSO brace donned in supine prior to EOB attempts. Due to lethargy and weakness, pt required up to Total A x 3 to sit EOB and up to Max A to maintain sitting balance. Pt able to tolerate EOB 6-10 minutes before returning to supine. Family at bedside and hopeful for improvements now that pt has had spinal surgery. Pending acute improvements, recommend intensive rehab services prior to DC home.      If plan is discharge home, recommend the following:  A lot of help with bathing/dressing/bathroom;Assistance with cooking/housework;Assist for transportation;Help with stairs or ramp for entrance;Two people to help with walking and/or transfers;A lot of help with walking and/or transfers;Two people to help with bathing/dressing/bathroom   Equipment Recommendations  BSC/3in1;Other (comment) (TBD)    Recommendations for Other Services Rehab consult    Precautions / Restrictions Precautions Precautions: Back;Cervical Recall of Precautions/Restrictions: Impaired Precaution/Restrictions Comments: cortrak Required Braces or Orthoses:  Spinal Brace;Cervical Brace Cervical Brace: Hard collar;At all times Spinal Brace: Thoracolumbosacral orthotic;Applied in supine position Restrictions Weight Bearing Restrictions Per Provider Order: No Other Position/Activity Restrictions: TLSO brace when HOB > 45* or when OOB       Mobility Bed Mobility Overal bed mobility: Needs Assistance Bed Mobility: Rolling, Sidelying to Sit, Sit to Sidelying Rolling: Total assist, +2 for physical assistance, +2 for safety/equipment Sidelying to sit: Total assist (x3)     Sit to sidelying: Total assist (x3) General bed mobility comments: assist to bend knee to assist with rolling, assist to guide UE to R side with overall Total A x 3 to needed to sit EOB, return to supine and roll side to side to don/doff TLSO brace    Transfers Overall transfer level: Needs assistance Equipment used: 2 person hand held assist Transfers: Sit to/from Stand             General transfer comment: partial stand with Total A x 2 while RN assisting in changing sheets.     Balance Overall balance assessment: Needs assistance Sitting-balance support: No upper extremity supported, Feet supported Sitting balance-Leahy Scale: Poor Sitting balance - Comments: brief CGA for 10 seconds once initially EOB progressing to Max A at times with R lateral lean and posterior lean Postural control: Right lateral lean, Posterior lean Standing balance support: Bilateral upper extremity supported, During functional activity Standing balance-Leahy Scale: Zero                             ADL either performed or assessed with clinical judgement   ADL Overall ADL's : Needs assistance/impaired Eating/Feeding: NPO   Grooming: Bed level;Total assistance;Wash/dry face  Lower Body Dressing: Total assistance;Bed level       Toileting- Clothing Manipulation and Hygiene: Total assistance;Bed level              Extremity/Trunk Assessment  Upper Extremity Assessment Upper Extremity Assessment: Right hand dominant;RUE deficits/detail;LUE deficits/detail RUE Deficits / Details: edematous, PROM WFL, able to squeeze when cued LUE Deficits / Details: hx of L shoulder injury and sx years ago w/ shoulder flexion impairments. edematous, PROM WFL, able to squeeze when cued   Lower Extremity Assessment Lower Extremity Assessment: Defer to PT evaluation        Vision   Vision Assessment?: No apparent visual deficits   Perception     Praxis     Communication Communication Communication: Impaired Factors Affecting Communication: Hearing impaired;Reduced clarity of speech   Cognition Arousal: Lethargic Behavior During Therapy: Flat affect Cognition: Difficult to assess Difficult to assess due to: Level of arousal           OT - Cognition Comments: limited by lethargy; opening eyes occasionally but fatigued quickly. inconsistent command following                 Following commands: Impaired Following commands impaired: Follows one step commands inconsistently, Follows one step commands with increased time      Cueing   Cueing Techniques: Verbal cues, Tactile cues  Exercises      Shoulder Instructions       General Comments Family at bedside. Nursing present for duration of session    Pertinent Vitals/ Pain       Pain Assessment Pain Assessment: Faces Faces Pain Scale: Hurts even more Pain Location: generalized; pt nodding head yes when asked various locations when pain felt Pain Descriptors / Indicators: Grimacing, Sore Pain Intervention(s): Monitored during session, RN gave pain meds during session  Home Living                                          Prior Functioning/Environment              Frequency  Min 2X/week        Progress Toward Goals  OT Goals(current goals can now be found in the care plan section)  Progress towards OT goals: OT to reassess next  treatment  Acute Rehab OT Goals Patient Stated Goal: family hopeful for continued progress now that pt has had surgery OT Goal Formulation: With patient/family Time For Goal Achievement: 06/30/24 Potential to Achieve Goals: Fair ADL Goals Pt Will Perform Grooming: with min assist;sitting Pt Will Perform Lower Body Dressing: with mod assist;sitting/lateral leans;sit to/from stand;with adaptive equipment Pt Will Transfer to Toilet: with mod assist;with +2 assist;stand pivot transfer;squat pivot transfer;bedside commode Pt Will Perform Toileting - Clothing Manipulation and hygiene: with mod assist;sit to/from stand;sitting/lateral leans Additional ADL Goal #1: Pt to complete bed mobility with Mod A in prep for EOB ADLs  Plan      Co-evaluation    PT/OT/SLP Co-Evaluation/Treatment: Yes Reason for Co-Treatment: For patient/therapist safety;To address functional/ADL transfers PT goals addressed during session: Mobility/safety with mobility;Balance OT goals addressed during session: Strengthening/ROM      AM-PAC OT 6 Clicks Daily Activity     Outcome Measure   Help from another person eating meals?: Total Help from another person taking care of personal grooming?: A Lot Help from another person toileting, which includes using toliet, bedpan, or urinal?: Total  Help from another person bathing (including washing, rinsing, drying)?: Total Help from another person to put on and taking off regular upper body clothing?: Total Help from another person to put on and taking off regular lower body clothing?: Total 6 Click Score: 7    End of Session Equipment Utilized During Treatment: Gait belt;Oxygen;Back brace;Cervical collar  OT Visit Diagnosis: Other abnormalities of gait and mobility (R26.89);Muscle weakness (generalized) (M62.81);Pain;History of falling (Z91.81)   Activity Tolerance Patient limited by fatigue;Patient limited by lethargy   Patient Left in bed;with call bell/phone  within reach;with nursing/sitter in room;with family/visitor present   Nurse Communication Mobility status        Time: 8659-8577 OT Time Calculation (min): 42 min  Charges: OT General Charges $OT Visit: 1 Visit OT Evaluation $OT Re-eval: 1 Re-eval OT Treatments $Therapeutic Activity: 8-22 mins  Mliss NOVAK, OTR/L Acute Rehab Services Office: [phone]   Mliss Fish 06/24/2024, 2:38 PM
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
## 2024-06-24 NOTE — TOC Progression Note (Signed)
 Transition of Care [facility]) - Progression Note    Patient Details  Name: Dave Wright MRN: [id number] Date of Birth: [birth year]  Transition of Care [facility]) CM/SW Contact  Justina Delcia Czar, RN Phone Number: [phone] 06/24/2024, 9:14 AM  Clinical Narrative:    Remains on vent, Levophed , and IV abx.  Chart reviewed for dc readiness, pt not medically stable. IP CM/CSW will continue to monitor for dc readiness.   Expected Discharge Plan: IP Rehab Facility Barriers to Discharge: Continued Medical Work up    Expected Discharge Plan and Services       Living arrangements for the past 2 months: Single Family Home                    Social Drivers of Health (SDOH) Interventions SDOH Screenings   Food Insecurity: No Food Insecurity (06/17/2024)  Housing: Low Risk  (06/17/2024)  Transportation Needs: No Transportation Needs (06/17/2024)  Utilities: Not At Risk (06/17/2024)  Depression (PHQ2-9): Medium Risk (03/08/2024)  Financial Resource Strain: Low Risk  (05/31/2024)  Physical Activity: Insufficiently Active (05/31/2024)  Social Connections: Moderately Isolated (06/17/2024)  Stress: No Stress Concern Present (05/31/2024)  Tobacco Use: Medium Risk (06/15/2024)    Readmission Risk Interventions     No data to display

## 2024-06-24 NOTE — Progress Notes (Signed)
    Providing Compassionate, Quality Care - Together   NEUROSURGERY PROGRESS NOTE     S: Extubated this AM.    O: EXAM:  BP (!) 127/35   Pulse 96   Temp 99.7 F (37.6 C)   Resp 18   Ht 6' (1.829 m)   Wt 93.4 kg   SpO2 93%   BMI 27.93 kg/m     Eyes open to voice MAEs Collar in place   ASSESSMENT:  83 y.o. with   -C5-6, 6-7 anterior osteophyte fracture  -T10-T11 fx/disc injury s/p percutaneous instrumentation/fusion on 8/5    PLAN: -Appropriate for outpatient follow up.  -Continue collar -Continue TLSO when upright -Call w/ questions/concerns.   Camie Pickle, [facility]

## 2024-06-24 NOTE — Progress Notes (Signed)
 Patient ID: Dave Wright, male   DOB: June 17, 1941, 83 y.o.   MRN: [id number] Follow up - Trauma Critical Care   Patient Details:    Dave Wright is an 83 y.o. male.  Lines/tubes : Airway 8 mm (Active)  Secured at (cm) 25 cm 06/24/24 0333  Measured From Lips 06/24/24 0333  Secured Location Center 06/24/24 0333  Secured By [employer] 06/24/24 0333  Bite Block No 06/24/24 0333  Tube Holder Repositioned Yes 06/23/24 1539  Prone position No 06/24/24 0333  Cuff Pressure (cm H2O) Green OR 18-26 CmH2O 06/24/24 0333  Site Condition Cool;Dry 06/24/24 0333     CVC Triple Lumen 06/19/24 Left Subclavian (Active)  Indication for Insertion or Continuance of Line Vasoactive infusions 06/23/24 2100  Site Assessment Clean, Dry, Intact 06/23/24 2100  Proximal Lumen Status Flushed;Saline locked 06/23/24 2100  Medial Lumen Status Flushed;Saline locked 06/23/24 2100  Distal Lumen Status In-line blood sampling system in place 06/23/24 2100  Dressing Type Transparent 06/23/24 2100  Dressing Status Antimicrobial disc/dressing in place 06/23/24 2100  Line Care Connections checked and tightened 06/23/24 2100  Dressing Intervention Other (Comment) 06/23/24 2100  Dressing Change Due 07/18/2024 06/23/24 2100     Arterial Line 06/19/24 Left Other (Comment) (Active)  Site Assessment Clean, Dry, Intact 06/23/24 2100  Line Status Pulsatile blood flow 06/23/24 2100  Art Line Waveform Appropriate;Square wave test performed 06/23/24 2100  Art Line Interventions Zeroed and calibrated 06/23/24 2100  Color/Movement/Sensation Capillary refill less than 3 sec 06/23/24 2100  Dressing Type Transparent 06/23/24 2100  Dressing Status Clean, Dry, Intact;Old drainage 06/23/24 2100  Interventions Other (Comment) 06/23/24 2100  Dressing Change Due 06/24/2024 06/23/24 2100     Urethral Catheter Hope Fahrner Temperature probe 16 Fr. (Active)  Indication for Insertion or Continuance of Catheter Therapy based  on hourly urine output monitoring and documentation for critical condition (NOT STRICT I&O) 06/23/24 2000  Site Assessment Clean, Dry, Intact 06/23/24 2000  Catheter Maintenance Bag below level of bladder;Catheter secured;Drainage bag/tubing not touching floor;Insertion date on drainage bag;No dependent loops;Seal intact 06/23/24 2000  Collection Container Standard drainage bag 06/23/24 2000  Securement Method Adhesive securement device 06/23/24 2000  Urinary Catheter Interventions (if applicable) Unclamped 06/23/24 2000  Output (mL) 150 mL 06/24/24 0607     Fecal Management System 34 mL (Active)  Does patient meet criteria for removal? No 06/23/24 2000  Daily care Skin around tube assessed 06/23/24 2000  Patient Indicator Assessment Green;Green after repositioning patient 06/23/24 2000  Bulb Deflated and Reinflated Yes 06/23/24 2000  Amount in bulb 34 mL 06/23/24 2000  Output (mL) 50 mL 06/24/24 0607    Microbiology/Sepsis markers: Results for orders placed or performed during the hospital encounter of 06/15/24  MRSA Next Gen by PCR, Nasal     Status: None   Collection Time: 06/16/24  2:33 AM   Specimen: Nasal Mucosa; Nasal Swab  Result Value Ref Range Status   MRSA by PCR Next Gen NOT DETECTED NOT DETECTED Final    Comment: (NOTE) The GeneXpert MRSA Assay (FDA approved for NASAL specimens only), is one component of a comprehensive MRSA colonization surveillance program. It is not intended to diagnose MRSA infection nor to guide or monitor treatment for MRSA infections. Test performance is not FDA approved in patients less than 60 years old. Performed at [facility], [address] [place], [place] [address]   Culture, blood (Routine X 2) w Reflex to ID Panel     Status:  None (Preliminary result)   Collection Time: 06/19/24 11:17 AM   Specimen: BLOOD RIGHT ARM  Result Value Ref Range Status   Specimen Description BLOOD RIGHT ARM  Final   Special Requests   Final     BOTTLES DRAWN AEROBIC AND ANAEROBIC Blood Culture adequate volume   Culture   Final    NO GROWTH 4 DAYS Performed at [facility], [address] [place], [place] [address]    Report Status PENDING  Incomplete  Culture, blood (Routine X 2) w Reflex to ID Panel     Status: None (Preliminary result)   Collection Time: 06/19/24 11:17 AM   Specimen: BLOOD LEFT HAND  Result Value Ref Range Status   Specimen Description BLOOD LEFT HAND  Final   Special Requests   Final    BOTTLES DRAWN AEROBIC AND ANAEROBIC Blood Culture adequate volume   Culture   Final    NO GROWTH 4 DAYS Performed at [facility], [address], [place], [place] [address]    Report Status PENDING  Incomplete  Culture, Respiratory w Gram Stain     Status: None   Collection Time: 06/19/24  4:04 PM   Specimen: Bronchoalveolar Lavage; Respiratory  Result Value Ref Range Status   Specimen Description BRONCHIAL ALVEOLAR LAVAGE  Final   Special Requests NONE  Final   Gram Stain   Final    MODERATE WBC PRESENT, PREDOMINANTLY PMN FEW GRAM POSITIVE COCCI RARE GRAM POSITIVE RODS    Culture   Final    Normal respiratory flora-no Staph aureus or Pseudomonas seen Performed at [facility], [address] [place], [place] [address]    Report Status 06/22/2024 FINAL  Final    Anti-infectives:  Anti-infectives (From admission, onward)    Start     Dose/Rate Route Frequency Ordered Stop   06/23/24 1400  piperacillin -tazobactam (ZOSYN ) IVPB 2.25 g        2.25 g 100 mL/hr over 30 Minutes Intravenous Every 8 hours 06/23/24 0707 06/25/24 2359   06/22/24 1800  ceFAZolin  (ANCEF ) IVPB 2g/100 mL premix  Status:  Discontinued        2 g 200 mL/hr over 30 Minutes Intravenous Every 6 hours 06/22/24 1625 06/22/24 1648   06/19/24 1645  piperacillin -tazobactam (ZOSYN ) IVPB 3.375 g  Status:  Discontinued        3.375 g 12.5 mL/hr over 240 Minutes Intravenous Every 8 hours 06/19/24 1555 06/23/24 9292       Consults: Treatment Team:  Debby Dorn MATSU, MD Md, Trauma, MD  CCM  Studies:    Events:  Subjective:    Overnight Issues: sedation on hold, weaning  Objective:  Vital signs for last 24 hours: Temp:  [97.9 F (36.6 C)-99.1 F (37.3 C)] 99.1 F (37.3 C) (08/07 0700) Pulse Rate:  [74-111] 93 (08/07 0700) Resp:  [0-41] 18 (08/07 0700) BP: (119-127)/(35-38) 127/35 (08/06 1539) SpO2:  [97 %-100 %] 99 % (08/07 0700) Arterial Line BP: (109-148)/(28-46) 132/36 (08/07 0700) FiO2 (%):  [40 %] 40 % (08/07 0724) Weight:  [93.4 kg] 93.4 kg (08/07 0107)  Hemodynamic parameters for last 24 hours: CVP:  [2 mmHg-9 mmHg] 4 mmHg  Intake/Output from previous day: 08/06 0701 - 08/07 0700 In: 2335.7 [I.V.:113.4; WH/HU:8025.3; IV Piggyback:247.7] Out: 2069 [Urine:1950; Stool:119]  Intake/Output this shift: No intake/output data recorded.  Vent settings for last 24 hours: Vent Mode: PSV;CPAP FiO2 (%):  [40 %] 40 % Set Rate:  [18 bmp] 18 bmp  Vt Set:  [540 mL] 540 mL PEEP:  [5 cmH20] 5 cmH20 Pressure Support:  [8 cmH20] 8 cmH20 Plateau Pressure:  [16 cmH20-19 cmH20] 19 cmH20  Physical Exam:  General: on vent Neuro: opens eyes, F/C RUE HEENT/Neck: ETT and collar Resp: clear to auscultation bilaterally CVS: IRR GI: soft, NT Extremities: less edema  Results for orders placed or performed during the hospital encounter of 06/15/24 (from the past 24 hours)  Glucose, capillary     Status: Abnormal   Collection Time: 06/23/24  8:55 AM  Result Value Ref Range   Glucose-Capillary 168 (H) 70 - 99 mg/dL  Glucose, capillary     Status: Abnormal   Collection Time: 06/23/24  9:58 AM  Result Value Ref Range   Glucose-Capillary 168 (H) 70 - 99 mg/dL  CBC     Status: Abnormal   Collection Time: 06/23/24 11:01 AM  Result Value Ref Range   WBC 13.3 (H) 4.0 - 10.5 K/uL   RBC 2.32 (L) 4.22 - 5.81 MIL/uL   Hemoglobin 7.6 (L) 13.0 - 17.0 g/dL   HCT 75.8 (L) 60.9 - 47.9 %   MCV 103.9 (H)  80.0 - 100.0 fL   MCH 32.8 26.0 - 34.0 pg   MCHC 31.5 30.0 - 36.0 g/dL   RDW 83.5 (H) 88.4 - 84.4 %   Platelets 141 (L) 150 - 400 K/uL   nRBC 0.0 0.0 - 0.2 %  Glucose, capillary     Status: Abnormal   Collection Time: 06/23/24 12:03 PM  Result Value Ref Range   Glucose-Capillary 144 (H) 70 - 99 mg/dL  Glucose, capillary     Status: Abnormal   Collection Time: 06/23/24  1:58 PM  Result Value Ref Range   Glucose-Capillary 208 (H) 70 - 99 mg/dL  Glucose, capillary     Status: Abnormal   Collection Time: 06/23/24  3:04 PM  Result Value Ref Range   Glucose-Capillary 174 (H) 70 - 99 mg/dL  Glucose, capillary     Status: Abnormal   Collection Time: 06/23/24  4:18 PM  Result Value Ref Range   Glucose-Capillary 158 (H) 70 - 99 mg/dL  Glucose, capillary     Status: Abnormal   Collection Time: 06/23/24  7:44 PM  Result Value Ref Range   Glucose-Capillary 138 (H) 70 - 99 mg/dL  Glucose, capillary     Status: Abnormal   Collection Time: 06/23/24 11:19 PM  Result Value Ref Range   Glucose-Capillary 188 (H) 70 - 99 mg/dL  Glucose, capillary     Status: Abnormal   Collection Time: 06/24/24  1:10 AM  Result Value Ref Range   Glucose-Capillary 230 (H) 70 - 99 mg/dL  Basic metabolic panel     Status: Abnormal   Collection Time: 06/24/24  3:25 AM  Result Value Ref Range   Sodium 138 135 - 145 mmol/L   Potassium 4.6 3.5 - 5.1 mmol/L   Chloride 105 98 - 111 mmol/L   CO2 24 22 - 32 mmol/L   Glucose, Bld 278 (H) 70 - 99 mg/dL   BUN 872 (H) 8 - 23 mg/dL   Creatinine, Ser 6.78 (H) 0.61 - 1.24 mg/dL   Calcium  8.3 (L) 8.9 - 10.3 mg/dL   GFR, Estimated 18 (L) >60 mL/min   Anion gap 9 5 - 15  Glucose, capillary     Status: Abnormal   Collection Time: 06/24/24  4:14 AM  Result Value Ref Range   Glucose-Capillary 237 (H) 70 - 99 mg/dL  Glucose, capillary     Status: Abnormal   Collection Time: 06/24/24  7:37 AM  Result Value Ref Range   Glucose-Capillary 251 (H) 70 - 99 mg/dL    Assessment &  Plan: Present on Admission:  Thoracic spine fracture (HCC)    LOS: 9 days   Additional comments:I reviewed the patient's new clinical lab test results. / Mechanical GLF   Acute hypercarbic respiratory failure due to aspiration - intubated 8/2 after aspiration event. Vent settings minimal this AM. Wean to extubate per CCM C7 anterosuperior corner VB fx with involvement of  C6-C7 ventral osteophyte and widening of the C6-C7 intervertebral disc space - NSGY c/s, Dr. Debby, continue collar Acute nondisplaced fracture through C5-C6 ventral osteophyte - NSGY c/s, Dr. Debby, continue collar T12 vertebral body fx with widening of T11-12 disc space - s/p fixation 8/5 w/ Dr. Debby Occult L apical PTX, now with R effusion  L 7-9 rib fractures  Haziness in retroperitoneum around SMA - MRA somewhat motion degraded but no clear vascular injury Scalp laceration - small, local wound care Dysphagia  BPH CKD stage IIIb T2DM  Hepatic cirrhosis HTN Urinary retention  HLD Hx of CVA Gout ABL anemia    Neuro - OR 8/5 with NSGY for thoracic fixation - continue cervical collar - opening eyes to command this morning - would favor PRNs over continuous infusions for agitation     CV - new Afib this admission. HR 80s this morning - PRN metoprolol  added 8/5 - off levophed    Pulm - intubated 8/2 after aspiration event, minimal settings currently - resp cx no growth 3/3, on empiric zosyn  - nebs and chest PT - extubate   FEN/GI - hold TF now for extubation - multiple BM. Hold bowel regimen.    GU  - creatinine 3.2 - good U/O - TOV today  Heme/ID - empiric zosyn  for aspiration PNA - resp cx NG 3/3   Endo - continue SSI, insulin  gtt  PPX - PPI - LMWH 30BID   Plan - ICU, weaning well, follows some commands. D/W CCM at the bedside. Trial of extubation. Appreciate their help.  Critical Care Total Time*: 32 Minutes  Dann Hummer, MD, MPH, FACS Trauma & General Surgery Use  [email or web address] to contact on call provider  06/24/2024  *Care during the described time interval was provided by me. I have reviewed this patient's available data, including medical history, events of note, physical examination and test results as part of my evaluation.

## 2024-06-25 DIAGNOSIS — N1832 Chronic kidney disease, stage 3b: Secondary | ICD-10-CM | POA: Diagnosis not present

## 2024-06-25 DIAGNOSIS — G9340 Encephalopathy, unspecified: Secondary | ICD-10-CM

## 2024-06-25 DIAGNOSIS — J690 Pneumonitis due to inhalation of food and vomit: Secondary | ICD-10-CM | POA: Diagnosis not present

## 2024-06-25 DIAGNOSIS — J9601 Acute respiratory failure with hypoxia: Secondary | ICD-10-CM | POA: Diagnosis not present

## 2024-06-25 LAB — GLUCOSE, CAPILLARY
Glucose-Capillary: 195 mg/dL — ABNORMAL HIGH (ref 70–99)
Glucose-Capillary: 144 mg/dL — ABNORMAL HIGH (ref 70–99)
Glucose-Capillary: 167 mg/dL — ABNORMAL HIGH (ref 70–99)
Glucose-Capillary: 184 mg/dL — ABNORMAL HIGH (ref 70–99)
Glucose-Capillary: 173 mg/dL — ABNORMAL HIGH (ref 70–99)
Glucose-Capillary: 152 mg/dL — ABNORMAL HIGH (ref 70–99)

## 2024-06-25 LAB — BASIC METABOLIC PANEL WITH GFR
Anion gap: 11 (ref 5–15)
BUN: 115 mg/dL — ABNORMAL HIGH (ref 8–23)
CO2: 21 mmol/L — ABNORMAL LOW (ref 22–32)
Calcium: 6.8 mg/dL — ABNORMAL LOW (ref 8.9–10.3)
Chloride: 117 mmol/L — ABNORMAL HIGH (ref 98–111)
Creatinine, Ser: 2.5 mg/dL — ABNORMAL HIGH (ref 0.61–1.24)
GFR, Estimated: 25 mL/min — ABNORMAL LOW (ref >60)
Glucose, Bld: 126 mg/dL — ABNORMAL HIGH (ref 70–99)
Potassium: 3.7 mmol/L (ref 3.5–5.1)
Sodium: 149 mmol/L — ABNORMAL HIGH (ref 135–145)

## 2024-06-25 MED ORDER — POTASSIUM CHLORIDE 20 MEQ PO PACK
20.0000 meq | PACK | Freq: Once | ORAL | Status: AC
  Administered 2024-06-25: 20 meq
  Filled 2024-06-25: qty 1

## 2024-06-25 MED ORDER — CALCIUM GLUCONATE-NACL 2-0.675 GM/100ML-% IV SOLN
2.0000 g | Freq: Once | INTRAVENOUS | Status: AC
  Administered 2024-06-25: 2000 mg via INTRAVENOUS
  Filled 2024-06-25: qty 100

## 2024-06-25 MED ORDER — BANATROL TF EN LIQD: 60.0000 mL | Freq: Two times a day (BID) | ENTERAL | Status: DC

## 2024-06-25 MED ORDER — MORPHINE SULFATE (PF) 2 MG/ML IV SOLN: 2.0000 mg | INTRAVENOUS | Status: DC | PRN

## 2024-06-25 MED ORDER — POLYVINYL ALCOHOL 1.4 % OP SOLN: 1.0000 [drp] | Freq: Four times a day (QID) | OPHTHALMIC | Status: DC | PRN

## 2024-06-25 MED ORDER — FUROSEMIDE 10 MG/ML IJ SOLN
100.0000 mg | Freq: Three times a day (TID) | INTRAVENOUS | Status: DC
  Filled 2024-06-25 (×2): qty 10

## 2024-06-25 MED ORDER — GLYCOPYRROLATE 0.2 MG/ML IJ SOLN
0.2000 mg | INTRAMUSCULAR | Status: DC | PRN
  Administered 2024-06-25: 0.2 mg via INTRAVENOUS
  Filled 2024-06-25: qty 1

## 2024-06-25 MED ORDER — ACETAMINOPHEN 325 MG PO TABS
650.0000 mg | ORAL_TABLET | Freq: Four times a day (QID) | ORAL | Status: DC | PRN
Start: 2024-06-25 — End: 2024-06-26

## 2024-06-25 MED ORDER — GLYCOPYRROLATE 0.2 MG/ML IJ SOLN: 0.2000 mg | INTRAMUSCULAR | Status: DC | PRN

## 2024-06-25 MED ORDER — ACETAMINOPHEN 650 MG RE SUPP: 650.0000 mg | Freq: Four times a day (QID) | RECTAL | Status: DC | PRN

## 2024-06-25 MED ORDER — SODIUM CHLORIDE 0.9 % IV SOLN: INTRAVENOUS | Status: DC

## 2024-06-25 MED ORDER — PIPERACILLIN-TAZOBACTAM 3.375 G IVPB
3.3750 g | Freq: Three times a day (TID) | INTRAVENOUS | Status: DC
  Filled 2024-06-25: qty 50

## 2024-06-25 MED ORDER — GLYCOPYRROLATE 1 MG PO TABS: 1.0000 mg | ORAL_TABLET | ORAL | Status: DC | PRN

## 2024-06-25 MED ORDER — MIDAZOLAM HCL 2 MG/2ML IJ SOLN
2.0000 mg | INTRAMUSCULAR | Status: DC | PRN
  Administered 2024-06-25: 2 mg via INTRAVENOUS
  Filled 2024-06-25: qty 2

## 2024-06-25 MED ORDER — MORPHINE 100MG IN NS 100ML (1MG/ML) PREMIX INFUSION
0.0000 mg/h | INTRAVENOUS | Status: DC
  Administered 2024-06-25: 5 mg/h via INTRAVENOUS
  Administered 2024-06-25 – 2024-06-26 (×2): 15 mg/h via INTRAVENOUS
  Filled 2024-06-25 (×3): qty 100

## 2024-06-25 MED ORDER — MORPHINE BOLUS VIA INFUSION
5.0000 mg | INTRAVENOUS | Status: DC | PRN
  Administered 2024-06-25 (×3): 5 mg via INTRAVENOUS

## 2024-06-25 MED ORDER — POTASSIUM CHLORIDE 10 MEQ/100ML IV SOLN
10.0000 meq | INTRAVENOUS | Status: DC
  Administered 2024-06-25 (×3): 10 meq via INTRAVENOUS
  Filled 2024-06-25 (×3): qty 100

## 2024-06-25 NOTE — Progress Notes (Signed)
 RT called by RN due to desaturation. This RT increase FiO2 to 80% from 70% and NTS'd Pt. Aspirated copious ammount of yellow/tan thick secretions. Pt Spo2 now 94%.

## 2024-06-25 NOTE — Progress Notes (Signed)
 Clinical update provided to patient's wife via phone. Expressed my concerns about the ability to manage secretions and impending respiratory failure within the next 12-24 hours. We discussed options of trach, which would likely need to be accompanied by PEG given his chronic dysphagia, followed by anticipated SNF given current clinical state and we discussed transition to comfort care if NIPPV has been maximized. She would like to discuss with their daughter before making a final decision, but I believe based on this conversation and prior ones that trach would not be in line with the patient's wishes. I did communicate that currently his code status allows for intubation and should he decline, we would try to call her prior to reintubation to discuss, but that if the final decision is not to reintubate, I would recommend ensuring that is communicated with us  as quickly as possible. Daughter is expected to be at bedside around 1500 and wife will discuss with her then.  Critical care time:  Dreama Hanger, MD
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
## 2024-06-25 NOTE — Progress Notes (Signed)
 Nutrition Follow-up  DOCUMENTATION CODES:   Non-severe (moderate) malnutrition in context of chronic illness  INTERVENTION:  Continue TF via Cortrak: Osmolite 1.2 at 9ml/hr ( per day) 60ml ProSource TF 20 BID Provides 2032 kcal, 127g protein, free water   Banatrol BID-provides 45kcal, 5g soluble fiber and 2g protein per serving.  NUTRITION DIAGNOSIS:  Moderate Malnutrition related to chronic illness as evidenced by moderate muscle depletion, moderate fat depletion, severe muscle depletion. - remains applicable  GOAL:  Patient will meet greater than or equal to 90% of their needs - goal met via TF  MONITOR:  TF tolerance, Diet advancement, Labs, Weight trends, Skin  REASON FOR ASSESSMENT:  Consult Enteral/tube feeding initiation and management  ASSESSMENT:  83 yo male admitted post mechanical GLF (fell backwards onto concrete floor) with C-Spine and T-spine fractures in addition to pneumothorax, L rib fracture. 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 Trauma MD suspects need for re-intubation.   AKI improving with aggressive diuresis.   Checked in with patient at bedside. No family present at that time.  TF infusing at goal rate via Cortrak. Patient overall tolerating well. Discussed with RN adjusting Cortrak/bridle to be placed above nasal cannula to prevent pressure injury to upper lip.   Hyperglycemia improving with adjustments to insulin  regimen.   Continuing with multiple lose stools. Will  add banatrol BID to provide soluble fiber and aid in bulking.    Admit weight: 86.6 kg Current weight: 94.1 kg + generalized edema  Medications: SSI 0-9 units q4h, novolog  10 units q4h, semglee  36 units BID, MVI Drips: Lasix  @ 100mg  q8h Abx  Labs:  Sodium 149 BUN 115 Cr 2.50 Calcium  6.8 (no updated albumin to correct for) GFR 25 CBG's 144-213 x24 hours  Diet Order:   Diet Order             Diet NPO time specified  Diet effective midnight                   EDUCATION NEEDS:  Education needs have been addressed  Skin:  Skin Assessment: Skin Integrity Issues: Skin Integrity Issues:: DTI DTI: bilateral buttock/sacral area (images in chart)-chronic in nature per wife (previously documented as hematoma) Other: scalp laceration  Last BM:  x24 hours + 2 unmeasured occurences  Height:  Ht Readings from Last 1 Encounters:  06/24/24 6' (1.829 m)    Weight:  Wt Readings from Last 1 Encounters:  06/25/24 94.1 kg   BMI:  Body mass index is 28.14 kg/m.  Estimated Nutritional Needs:   Kcal:  1900-2100 kcals  Protein:  115-130 g  Fluid:  >/= 1.9 L  Dave Wright, RDN, LDN Clinical Nutrition See AMiON for contact information.

## 2024-06-25 NOTE — Progress Notes (Signed)
 PHARMACY NOTE:  ANTIMICROBIAL RENAL DOSAGE ADJUSTMENT  Current antimicrobial regimen includes a mismatch between antimicrobial dosage and estimated renal function.  As per policy approved by the Pharmacy & Therapeutics and Medical Executive Committees, the antimicrobial dosage will be adjusted accordingly.  Current antimicrobial dosage:  Zosyn  2.25g IV q8h over 30 min  Indication: PNA  Renal Function:  Estimated Creatinine Clearance: 26.2 mL/min (A) (by C-G formula based on SCr of 2.5 mg/dL (H)). []      On intermittent HD, scheduled: []      On CRRT    Antimicrobial dosage has been changed to:  Zosyn  3.375g IV q8h over 4 hr  Additional comments:   Thank you for allowing pharmacy to be a part of this patient's care.  Dave Wright, [facility] 06/25/2024 9:09 AM

## 2024-06-25 NOTE — Plan of Care (Signed)
  Problem: Clinical Measurements: Goal: Will remain free from infection Outcome: Progressing Goal: Diagnostic test results will improve Outcome: Progressing Goal: Respiratory complications will improve Outcome: Progressing Goal: Cardiovascular complication will be avoided Outcome: Progressing   Problem: Nutrition: Goal: Adequate nutrition will be maintained Outcome: Progressing   Problem: Elimination: Goal: Will not experience complications related to bowel motility Outcome: Progressing Goal: Will not experience complications related to urinary retention Outcome: Progressing   

## 2024-06-25 NOTE — Progress Notes (Signed)
 1350, page to Dr. Teresa with concern for deterioration (lower blood pressure and hypoventilation) Order to give 1 PRBC  1435, page to Dr. Teresa with worsening respiratory status and patient obtunded. Dr. Teresa coming to bedside  Dr. Teresa at bedside, patient with pulse but change in HR and worsening hypotension. Verbal order for LR bolus. Heart rate dropped to 50s, Nurse request for atropine  at bedside and asked Dr. Teresa to stay as nurse believed patient about to code. Patients hear rate dropped to 20s with a pulse, atropine  given, placed with back board, and started to bag mask ventilate. A code page was sent out by someone unknown. CRNA intubated patient. CCM consulted by Dr. Teresa. CCM placed IO, emergent blood given (1 PRBC) through IO. Levophed  started and titrated per multiple verbal orders by Dr. Harold while Axillary Aline was being placed. Dr. Harold placed central line and levophed  then started through central access (marked in [facility]). Patient received Rocuronium  for a bronchoscopy where unknown tan liquid/food was suctioned from lungs.  Bronchoscopy finished, temp foley placed, propofol  & fentanyl  started as patient was still paralyzed. (In [facility])  Raguel Tamanika Heiney RN 06/19/24
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
## 2024-06-25 NOTE — Progress Notes (Signed)
 Trauma/Critical Care Follow Up Note  Subjective:    Overnight Issues:   Objective:  Vital signs for last 24 hours: Temp:  [98.2 F (36.8 C)-99.7 F (37.6 C)] 98.8 F (37.1 C) (08/08 0600) Pulse Rate:  [83-128] 85 (08/08 0600) Resp:  [14-35] 18 (08/08 0600) SpO2:  [83 %-100 %] 100 % (08/08 0600) Arterial Line BP: (124-206)/(32-88) 152/38 (08/08 0600) FiO2 (%):  [40 %-90 %] 90 % (08/08 0325) Weight:  [94.1 kg] 94.1 kg (08/08 0500)  Hemodynamic parameters for last 24 hours: CVP:  [1 mmHg-8 mmHg] 2 mmHg  Intake/Output from previous day: 08/07 0701 - 08/08 0700 In: 1917.5 [I.V.:491.9; NG/GT:1065.7; IV Piggyback:270] Out: 3229 [Urine:3125; Stool:104]  Intake/Output this shift: No intake/output data recorded.  Vent settings for last 24 hours: Vent Mode: PSV;CPAP FiO2 (%):  [40 %-90 %] 90 % PEEP:  [5 cmH20] 5 cmH20 Pressure Support:  [8 cmH20] 8 cmH20  Physical Exam:  Gen: comfortable, no distress Neuro: not following commands HEENT: PERRL Neck: supple CV: RRR Pulm: mildly labored breathing on HHFNC Abd: soft, NT  , +BM GU: urine clear and yellow, +Foley Extr: wwp, no edema  Results for orders placed or performed during the hospital encounter of 06/15/24 (from the past 24 hours)  Glucose, capillary     Status: Abnormal   Collection Time: 06/24/24  7:37 AM  Result Value Ref Range   Glucose-Capillary 251 (H) 70 - 99 mg/dL  Culture, Respiratory w Gram Stain     Status: None (Preliminary result)   Collection Time: 06/24/24 10:03 AM   Specimen: Tracheal Aspirate; Respiratory  Result Value Ref Range   Specimen Description TRACHEAL ASPIRATE    Special Requests NONE    Gram Stain      RARE WBC PRESENT,BOTH PMN AND MONONUCLEAR NO ORGANISMS SEEN Performed at [facility], [address], [place], [place] [address]    Culture PENDING    Report Status PENDING   Glucose, capillary     Status: Abnormal   Collection Time: 06/24/24 11:34 AM  Result Value Ref Range    Glucose-Capillary 229 (H) 70 - 99 mg/dL  Glucose, capillary     Status: Abnormal   Collection Time: 06/24/24  4:37 PM  Result Value Ref Range   Glucose-Capillary 201 (H) 70 - 99 mg/dL  Glucose, capillary     Status: Abnormal   Collection Time: 06/24/24  7:32 PM  Result Value Ref Range   Glucose-Capillary 215 (H) 70 - 99 mg/dL  Glucose, capillary     Status: Abnormal   Collection Time: 06/24/24  9:36 PM  Result Value Ref Range   Glucose-Capillary 213 (H) 70 - 99 mg/dL  Glucose, capillary     Status: Abnormal   Collection Time: 06/24/24 11:02 PM  Result Value Ref Range   Glucose-Capillary 182 (H) 70 - 99 mg/dL  Glucose, capillary     Status: Abnormal   Collection Time: 06/25/24 12:46 AM  Result Value Ref Range   Glucose-Capillary 195 (H) 70 - 99 mg/dL  Glucose, capillary     Status: Abnormal   Collection Time: 06/25/24  3:39 AM  Result Value Ref Range   Glucose-Capillary 144 (H) 70 - 99 mg/dL  Basic metabolic panel     Status: Abnormal   Collection Time: 06/25/24  4:27 AM  Result Value Ref Range   Sodium 149 (H) 135 - 145 mmol/L   Potassium 3.7 3.5 - 5.1 mmol/L   Chloride 117 (H) 98 - 111 mmol/L  CO2 21 (L) 22 - 32 mmol/L   Glucose, Bld 126 (H) 70 - 99 mg/dL   BUN 884 (H) 8 - 23 mg/dL   Creatinine, Ser 7.49 (H) 0.61 - 1.24 mg/dL   Calcium  6.8 (L) 8.9 - 10.3 mg/dL   GFR, Estimated 25 (L) >60 mL/min   Anion gap 11 5 - 15    Assessment & Plan: The plan of care was discussed with the bedside nurse for the day, who is in agreement with this plan and no additional concerns were raised.   Present on Admission:  Thoracic spine fracture (HCC)    LOS: 10 days   Additional comments:I reviewed the patient's new clinical lab test results.   and I reviewed the patients new imaging test results.    Mechanical GLF   Acute hypercarbic respiratory failure due to aspiration - intubated 8/2 after aspiration event. Vent settings minimal this AM. Wean to extubate per CCM C7  anterosuperior corner VB fx with involvement of  C6-C7 ventral osteophyte and widening of the C6-C7 intervertebral disc space - NSGY c/s, Dr. Debby, s/p posterior fixation 8/5, continue collar at all times Acute nondisplaced fracture through C5-C6 ventral osteophyte - NSGY c/s, Dr. Debby, s/p posterior fixation 8/5, continue collar at all times T12 vertebral body fx with widening of T11-12 disc space - s/p fixation 8/5 w/ Dr. Debby Occult L apical PTX, now with R effusion  L 7-9 rib fractures  Haziness in retroperitoneum around SMA - MRA somewhat motion degraded but no clear vascular injury Scalp laceration - small, local wound care Dysphagia  BPH CKD stage IIIb T2DM  Hepatic cirrhosis HTN Urinary retention  HLD Hx of CVA Gout ABL anemia    Neuro - continue cervical collar at all times, okay to remove, repad and reapply in bed prn skin irritation    CV - NSR on amio gtt - PRN metoprolol  added 8/5 - off levophed    Pulm - intubated 8/2 after aspiration event, extubated 8/7, currently on HHFNC and still having difficulty with secretions, will need to d/w wife today desires re: intubation  - resp cx no growth 3/3, on empiric zosyn  to end today - nebs and chest PT   FEN/GI - hold TF now for extubation   GU  - creatinine 2.5 - good U/O  Heme/ID - empiric zosyn  for aspiration PNA, to end today - resp cx negative   Endo - continue SSI, insulin  gtt   PPX - PPI, d/c - SQH   Plan - ICU, DNR, d/w wife re: intubation today   Critical Care Total Time: 45 minutes  Dave GEANNIE Hanger, MD Trauma & General Surgery Please use [email or web address] to contact on call provider  06/25/2024  *Care during the described time interval was provided by me. I have reviewed this patient's available data, including medical history, events of note, physical examination and test results as part of my evaluation.

## 2024-06-25 NOTE — IPAL (Signed)
  Interdisciplinary Goals of Care Family Meeting   Date carried out: 06/25/2024  Location of the meeting: Bedside  Member's involved: Physician, Nurse Practitioner, Bedside Registered Nurse, Family Member or next of kin, and Other: Dr. Paola- Trauma surgery  Durable Power of Attorney or acting medical decision maker: wife    Discussion: We discussed goals of care for Dave Wright .  I was called to bedside for worsening respiratory distress. Due to rhonchi and increased WOB I nasotracheally suctioned Dave Wright with a copious amount of purulent sputum suctioned out. I spoke to his wife once he was less SOB. Dr. Paola called her at the same time and was on the phone concurrently. She asked about comfort care. We talked about what that would look like, and she feels like avoiding the vent and life prolonging measures in favor of comfort-focused care is most appropriate. She reported he keeps asking her to get the car and take him home. She feels he does not want to continue such aggressive care measures at this point. Her daughter is on the way.   Code status:   Code Status: Do not attempt resuscitation (DNR) PRE-ARREST INTERVENTIONS DESIRED   Disposition: In-patient comfort care  Time spent for the meeting: 10 min.    Dave SHAUNNA Gaskins, DO  06/25/2024, 2:13 PM

## 2024-06-25 NOTE — Progress Notes (Signed)
 Physical Therapy Treatment Patient Details Name: Dave Wright MRN: [id number] DOB: 10/31/1941 Today's Date: 06/25/2024   History of Present Illness Pt is a 83 yo male s/p fall.  admitted 7/29. CT(+) acute displaced C7 fx, non displaced C5-6, T10-11 vertical cleft fx with disc space involvement, Small L HPTX and L 7-9 rib fxs. Chest CT showed enlarging R effusion and ABG showed hypercarbia requiring intubation 8/2. s/p T10-T11 percutaneous instrumentation/fusion on 8/5, extubated 8/7. PMH arthritis, chroinc daily HA, chronic leukopenia,CVA, DM2, gout, hepatic cirrhosis, HTN, HLD, OA,squamous cell carcinoma, Back surg, R total shoulder sx.    PT Comments  Treated this morning with review of bed mobility techniques including log roll and transitions to and from edge of bed. Total assist +2. Pt able to hold himself in midline on EOB today and assist with placing hands in lap to stabilize. Intermittent min assist to correct balance and hold trunk more upright. Attempted stand with total assist +2, showing engagement and initiating LEs to rise but could not fully extend against his body weight today. Repositioned at end of session in modified chair setting on bed - HOB 57 degrees with TLSO in place. Patient will continue to benefit from skilled physical therapy services to further improve independence with functional mobility.     If plan is discharge home, recommend the following: Assist for transportation;Help with stairs or ramp for entrance;Two people to help with walking and/or transfers;Two people to help with bathing/dressing/bathroom;Assistance with cooking/housework;Assistance with feeding;Direct supervision/assist for medications management;Direct supervision/assist for financial management;Supervision due to cognitive status   Can travel by Doctor, hospital (measurements PT);Wheelchair cushion (measurements PT);BSC/3in1;Hospital bed (Pending  progress)    Recommendations for Other Services Rehab consult     Precautions / Restrictions Precautions Precautions: Back;Cervical Precaution Booklet Issued: No Recall of Precautions/Restrictions: Impaired Precaution/Restrictions Comments: cortrak Required Braces or Orthoses: Spinal Brace;Cervical Brace Cervical Brace: Hard collar;At all times Spinal Brace: Thoracolumbosacral orthotic;Applied in supine position Restrictions Weight Bearing Restrictions Per Provider Order: No Other Position/Activity Restrictions: TLSO brace when HOB > 45* or when OOB     Mobility  Bed Mobility Overal bed mobility: Needs Assistance Bed Mobility: Rolling, Sidelying to Sit, Sit to Sidelying Rolling: Total assist, +2 for physical assistance, +2 for safety/equipment Sidelying to sit: Total assist, +2 for physical assistance, +2 for safety/equipment (x3)     Sit to sidelying: Total assist, +2 for physical assistance, +2 for safety/equipment (x3) General bed mobility comments: Total assist +2 for rolling, using log roll techniques to donne TLSO in bed, grimacing when moving. Maintaining back and cervical precautions.Total assist +2 to rise from sidelying position and again to return to sidelying from seated with cues for technique.    Transfers Overall transfer level: Needs assistance Equipment used: 2 person hand held assist Transfers: Sit to/from Stand Sit to Stand: +2 physical assistance, +2 safety/equipment, Total assist           General transfer comment: Pt engaged LEs to assist but was unable to stand fully upright with +2 assist and bil UE support.    Ambulation/Gait                   Stairs             Wheelchair Mobility     Tilt Bed    Modified Rankin (Stroke Patients Only)       Balance Overall balance assessment: Needs assistance Sitting-balance support: Feet supported,  Bilateral upper extremity supported Sitting balance-Leahy Scale: Poor Sitting  balance - Comments: Periodically maintaining balance at CGA level. Able to slowly raise RUE onto lap with verbal cues. Intermittent min assist to correct lean towards Rt. Postural control: Right lateral lean, Posterior lean Standing balance support: Bilateral upper extremity supported, During functional activity Standing balance-Leahy Scale: Zero Standing balance comment: total +2                            Communication Communication Communication: Impaired Factors Affecting Communication: Hearing impaired;Reduced clarity of speech  Cognition Arousal: Lethargic Behavior During Therapy: Flat affect   PT - Cognitive impairments: Difficult to assess Difficult to assess due to: Level of arousal                     PT - Cognition Comments: Delayed responses, inconsistent with following simple commands Following commands: Impaired Following commands impaired: Follows one step commands inconsistently, Follows one step commands with increased time    Cueing Cueing Techniques: Verbal cues, Tactile cues  Exercises Low Level/ICU Exercises Hip ABduction/ADduction: PROM, Both, 5 reps, Supine Heel Slides: AAROM, PROM, Both, 5 reps, Supine Other Exercises Other Exercises: Seated balance, correcting posture back to midline with multimodal cues and feedback.    General Comments General comments (skin integrity, edema, etc.): Family present, supportive. SpO2 90% on HHFNC. BP 147/70.      Pertinent Vitals/Pain Pain Assessment Pain Assessment: CPOT Facial Expression: Relaxed, neutral Body Movements: Protection Muscle Tension: Relaxed Compliance with ventilator (intubated pts.): N/A Vocalization (extubated pts.): Sighing, moaning CPOT Total: 2 Pain Location: Unable to state but nods yes when asked if back/neck hurt. Grimacing with movements. Pain Descriptors / Indicators: Grimacing Pain Intervention(s): Limited activity within patient's tolerance, Monitored during session,  Repositioned    Home Living                          Prior Function            PT Goals (current goals can now be found in the care plan section) Acute Rehab PT Goals Patient Stated Goal: return home PT Goal Formulation: With patient/family Time For Goal Achievement: 07/08/24 Potential to Achieve Goals: Good Progress towards PT goals: Progressing toward goals    Frequency    Min 3X/week      PT Plan      Co-evaluation              AM-PAC PT 6 Clicks Mobility   Outcome Measure  Help needed turning from your back to your side while in a flat bed without using bedrails?: Total Help needed moving from lying on your back to sitting on the side of a flat bed without using bedrails?: Total Help needed moving to and from a bed to a chair (including a wheelchair)?: Total Help needed standing up from a chair using your arms (e.g., wheelchair or bedside chair)?: Total Help needed to walk in hospital room?: Total Help needed climbing 3-5 steps with a railing? : Total 6 Click Score: 6    End of Session Equipment Utilized During Treatment: Gait belt;Back brace;Cervical collar;Oxygen Activity Tolerance: Patient limited by lethargy Patient left: with call bell/phone within reach;with family/visitor present;in bed;with bed alarm set;with nursing/sitter in room;with SCD's reapplied (HOB to 57 degrees, TLSO on (modified chair position)) Nurse Communication: Mobility status PT Visit Diagnosis: Unsteadiness on feet (R26.81);Other abnormalities of gait and mobility (R26.89);Muscle  weakness (generalized) (M62.81);History of falling (Z91.81);Difficulty in walking, not elsewhere classified (R26.2);Pain;Other symptoms and signs involving the nervous system (R29.898) Pain - part of body:  (back and neck)     Time: 8974-8892 PT Time Calculation (min) (ACUTE ONLY): 42 min  Charges:    $Therapeutic Exercise: 8-22 mins $Therapeutic Activity: 23-37 mins PT General  Charges $$ ACUTE PT VISIT: 1 Visit                     Leontine Roads, PT, DPT [facility]  Rehabilitation Services Physical Therapist Office: [phone] Website: [email or web address]    Leontine GORMAN Roads 06/25/2024, 3:43 PM

## 2024-06-25 NOTE — Progress Notes (Signed)
 eLink Physician-Brief Progress Note Patient Name: Dave Wright DOB: 1941-08-19 MRN: [id number]   Date of Service  06/25/2024  HPI/Events of Note  Extubated earlier today, been placed on 2 L salter cannula saturating in the low 90s.  Having a difficulty clearing secretions and was NT suction with sats dropping into the 80s.  Weak with a c-collar in place, extremely high risk for reintubation  eICU Interventions  Switch to heated high flow, maintain NG suctioning intermittently     Intervention Category Intermediate Interventions: Respiratory distress - evaluation and management  Xara Paulding 06/25/2024, 12:02 AM

## 2024-06-25 NOTE — Progress Notes (Signed)
 Pt placed on HHFNC per MD order with initial settings of 55L/90%. Spo2 94% and holding. Will wean as able

## 2024-06-25 NOTE — Progress Notes (Signed)
 Pt was transitioned to 5L [place] from [facility] per CCM/trauma MD.

## 2024-06-27 LAB — CULTURE, RESPIRATORY W GRAM STAIN

## 2024-07-16 ENCOUNTER — Ambulatory Visit: Admitting: Gastroenterology

## 2024-07-19 NOTE — Progress Notes (Signed)
 eLink Physician-Brief Progress Note Patient Name: Dave Wright DOB: [birth year] MRN: [id number]   Date of Service  07/19/2024  HPI/Events of Note  Inpatient comfort care  eICU Interventions  RN may pronounce     Intervention Category Minor Interventions: Routine modifications to care plan (e.g. PRN medications for pain, fever)  Haakon Titsworth July 19, 2024, 4:06 AM

## 2024-07-19 DEATH — deceased

## 2024-08-18 NOTE — Discharge Summary (Signed)
 DEATH SUMMARY   Patient Details  Name: Dave Wright MRN: [id number] DOB: 1941/08/05  Admission/Discharge Information   Admit Date:  07/08/24  Date of Death: Date of Death: July 19, 2024  Time of Death: Time of Death: 0357  Length of Stay: 20-Sep-2024  Referring Physician: Candise Aleene DEL, MD   Reason(s) for Hospitalization  Fall  Cervical spine fracture Occult apical pneumothorax Diagnoses  Preliminary cause of death:  Secondary Diagnoses (including complications and co-morbidities):  Principal Problem:   Thoracic spine fracture [facility]) Active Problems:   Malnutrition of moderate degree   Septic shock (HCC)   Prolonged Q-T interval on ECG   Brief Hospital Course (including significant findings, care, treatment, and services provided and events leading to death)  Dave COBBINS is a 83 y.o. year old male s/p mechanical GLF onto concrete when trying to open a door. Denies dizzines/CP/new SOB/lightheadedness prior to the fall. Denies LOC. Unable to get up independently, EMS called by family. Sees GI for liver dysfunction. Sees nephrology, within a week prior to presentation. PCP is Dr. Helon.   Trauma workup significant for the below injuries along with their management:  Mechanical GLF   Acute hypercarbic respiratory failure due to aspiration - intubated 8/2 after aspiration event, extubated 8/8 with subsequent difficult managing secretions. GOC discussion was had with the patients wife (Dr. Paola and Dr. Gretta) - discussion regarding re-intubation followed by need for trach/PEG/ likely SNF, comfort measures were also discussed. Patients wife decided to proceed with comfort care. The patient died on 2024/07/19 as above. C7 anterosuperior corner VB fx with involvement of  C6-C7 ventral osteophyte and widening of the C6-C7 intervertebral disc space - NSGY c/s, Dr. Debby, s/p posterior fixation 8/5, continue collar at all times Acute nondisplaced fracture through C5-C6 ventral osteophyte -  NSGY c/s, Dr. Debby, s/p posterior fixation 8/5, continue collar at all times T12 vertebral body fx with widening of T11-12 disc space - s/p fixation 8/5 w/ Dr. Debby Occult L apical PTX, now with R effusion  L 7-9 rib fractures  Haziness in retroperitoneum around SMA - MRA somewhat motion degraded but no clear vascular injury Scalp laceration - small, local wound care Dysphagia  BPH CKD stage IIIb T2DM  Hepatic cirrhosis HTN Urinary retention  HLD Hx of CVA Gout ABL anemia    The above information was obtained entirely from chart review, I was never personally involved in this patients care.    Pertinent Labs and Studies  Significant Diagnostic Studies CT Head 1.  No evidence of an acute intracranial abnormality. 2. Midline parietal scalp hematoma. 3. Parenchymal atrophy, chronic small vessel ischemic disease and unchanged chronic infarcts, as described. 4. Minor right ethmoid sinus disease.  CT C spine   1. Acute, displaced fracture through the anterosuperior corner of the C7 vertebral body (also involving an adjacent C6-C7 ventral osteophyte). There is widening of the C6-C7 intervertebral disc space anteriorly (to 6 mm). A cervical spine MRI is recommended to assess for ligamentous injury, and to assess for involvement of the C6-C7 disc space. 2. Acute nondisplaced fracture through a C5-C6 ventral osteophyte. 3. Cervical spondylosis as described. 4. Incompletely imaged left apical pneumothorax.  CT Chest/Abd/Pelvis IMPRESSION: 1. Small left hemopneumothorax. No CT evidence of tension physiology. 2. Acute, minimally displaced fractures of the left 7-9 ribs posteriorly. 3. Vertical cleft fracture of the T12 vertebral body with disruption of the anterior longitudinal ligament and widening of the T11-12 disc space in keeping with a hyperextension injury. The posterior wall  of the T12 vertebral body appears intact, as are the posterior elements at T11-12. No  listhesis or facetal dislocation. 4. Infiltration of the retroperitoneum surrounding the origins of the superior mesenteric artery and celiac axis. A vascular injury is not clearly identified, however, evaluation is limited by noncontrast technique. Retroperitoneal edema, venous hemorrhage or retroperitoneal fibrosis could appear similarly. If there is clinical concern for vascular injury, CT arteriography is recommended for further evaluation. 5. Mild ascites, nonspecific. 6. Cholelithiasis 7. Moderate coronary artery calcification. 8. 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 Acute C7 anterior/superior fracture with adjacent disruption of the anterior longitudinal ligament and traumatic prevertebral edema. 2. Trace (1 to 2 mm thick) posterior epidural hemorrhage spanning from C4-C5 to C6-C7 with mild canal stenosis. 3. Posterior paraspinal and C6-C7 interspinous ligamentous strain. 4. C5-C6 osteophyte fracture better characterized on recent CT of the cervical spine.   MRI thoracic spine:   1. Acute T11 fracture with disruption of the adjacent anterior longitudinal ligament, adjacent prevertebral edema, and traumatic T10-T11 disc injury. 2. No significant stenosis.  MR angio abdomen 7/30 IMPRESSION: 1. Significant respiratory motion artifact limits evaluation. The mesenteric and renal arterial structures appear widely patent evidence of dissection or aneurysm, however. Abdominal venous structures are patent. 2. Cholelithiasis. 3. Mild splenomegaly. 4. Mild ascites. 5. Known thoracic spine fractures not well appreciated on this examination. Edema is seen within the disc space at T11-12 in keeping with known hyperextension injury at this level.  CXR 7/30 Trace left apical pneumothorax.   DG SWALLOW 7/31 Swallowing Evaluation Recommendations Swallowing Evaluation  Recommendations Recommendations: NPO; NPO except meds; Ice chips PRN after oral care (crushed meds until Cortrak is placed) Liquid Administration via: Spoon Medication Administration: Crushed with puree (until cortrak is placed) Supervision: Staff to assist with self-feeding Swallowing strategies  : Minimize environmental distractions; Slow rate; Small bites/sips; effortful swallow Oral care recommendations: Oral care QID (4x/day)     CXR 8/3 1. Layering effusions, right greater than left, with adjacent atelectasis/consolidation in the lower lungs.   Microbiology Resp Cx 8/2 - GPC/GPR; normal flora Resp Cx 8/7 - rare diphtheroids    Procedures/Operations    Procedure(s): POSTERIOR THORACIC FUSION THORACIC TEN -TWELVE PERCUTANEOUS INSTRUMENTATION ELEVEN OPEN REDUCTION OF THORACIC ELEVEN FRACTURE APPLICATION OF O-ARM Procedure Note         Almarie GORMAN Pringle 08/17/2024, 11:05 AM

## 2024-09-08 ENCOUNTER — Ambulatory Visit: Admitting: Family Medicine

## 2024-12-07 ENCOUNTER — Ambulatory Visit: Payer: Medicare Other | Admitting: Neurology
